# Patient Record
Sex: Male | Born: 1955 | Race: Asian | Hispanic: No | Marital: Married | Smoking: Never smoker
Health system: Southern US, Community
[De-identification: ages and names within clinical notes are randomized; demographics above are authoritative.]

## PROBLEM LIST (undated history)

## (undated) DIAGNOSIS — I219 Acute myocardial infarction, unspecified: Secondary | ICD-10-CM

## (undated) DIAGNOSIS — N400 Enlarged prostate without lower urinary tract symptoms: Secondary | ICD-10-CM

## (undated) DIAGNOSIS — E785 Hyperlipidemia, unspecified: Secondary | ICD-10-CM

## (undated) DIAGNOSIS — Z9889 Other specified postprocedural states: Secondary | ICD-10-CM

## (undated) DIAGNOSIS — S99919A Unspecified injury of unspecified ankle, initial encounter: Secondary | ICD-10-CM

## (undated) DIAGNOSIS — R0602 Shortness of breath: Secondary | ICD-10-CM

## (undated) DIAGNOSIS — M1990 Unspecified osteoarthritis, unspecified site: Secondary | ICD-10-CM

## (undated) DIAGNOSIS — I2510 Atherosclerotic heart disease of native coronary artery without angina pectoris: Secondary | ICD-10-CM

## (undated) DIAGNOSIS — E119 Type 2 diabetes mellitus without complications: Secondary | ICD-10-CM

## (undated) DIAGNOSIS — I499 Cardiac arrhythmia, unspecified: Secondary | ICD-10-CM

## (undated) DIAGNOSIS — H409 Unspecified glaucoma: Secondary | ICD-10-CM

## (undated) DIAGNOSIS — Z95810 Presence of automatic (implantable) cardiac defibrillator: Secondary | ICD-10-CM

## (undated) DIAGNOSIS — I10 Essential (primary) hypertension: Secondary | ICD-10-CM

## (undated) DIAGNOSIS — Z951 Presence of aortocoronary bypass graft: Secondary | ICD-10-CM

## (undated) DIAGNOSIS — R918 Other nonspecific abnormal finding of lung field: Secondary | ICD-10-CM

## (undated) DIAGNOSIS — C801 Malignant (primary) neoplasm, unspecified: Secondary | ICD-10-CM

## (undated) HISTORY — DX: Benign prostatic hyperplasia without lower urinary tract symptoms: N40.0

## (undated) HISTORY — DX: Unspecified glaucoma: H40.9

## (undated) HISTORY — DX: Hyperlipidemia, unspecified: E78.5

## (undated) HISTORY — PX: CORONARY ARTERY BYPASS GRAFT: SHX141

## (undated) HISTORY — DX: Essential (primary) hypertension: I10

## (undated) HISTORY — PX: INSERT / REPLACE / REMOVE PACEMAKER: SUR710

## (undated) HISTORY — DX: Presence of aortocoronary bypass graft: Z95.1

## (undated) HISTORY — DX: Type 2 diabetes mellitus without complications: E11.9

## (undated) HISTORY — PX: EYE SURGERY: SHX253

## (undated) HISTORY — DX: Other nonspecific abnormal finding of lung field: R91.8

## (undated) HISTORY — PX: APPENDECTOMY: SHX54

---

## 1998-03-02 ENCOUNTER — Other Ambulatory Visit: Admission: RE | Admit: 1998-03-02 | Discharge: 1998-03-02 | Payer: Self-pay

## 2001-08-14 ENCOUNTER — Inpatient Hospital Stay (HOSPITAL_COMMUNITY): Admission: AD | Admit: 2001-08-14 | Discharge: 2001-08-23 | Payer: Self-pay | Admitting: Internal Medicine

## 2001-08-15 ENCOUNTER — Encounter: Payer: Self-pay | Admitting: Internal Medicine

## 2001-08-15 ENCOUNTER — Encounter: Payer: Self-pay | Admitting: Thoracic Surgery (Cardiothoracic Vascular Surgery)

## 2001-08-16 ENCOUNTER — Encounter: Payer: Self-pay | Admitting: Internal Medicine

## 2001-08-18 ENCOUNTER — Encounter: Payer: Self-pay | Admitting: Thoracic Surgery (Cardiothoracic Vascular Surgery)

## 2001-08-19 ENCOUNTER — Encounter: Payer: Self-pay | Admitting: Thoracic Surgery (Cardiothoracic Vascular Surgery)

## 2001-08-20 ENCOUNTER — Encounter: Payer: Self-pay | Admitting: Thoracic Surgery (Cardiothoracic Vascular Surgery)

## 2001-09-22 ENCOUNTER — Encounter
Admission: RE | Admit: 2001-09-22 | Discharge: 2001-09-22 | Payer: Self-pay | Admitting: Thoracic Surgery (Cardiothoracic Vascular Surgery)

## 2001-09-22 ENCOUNTER — Encounter: Payer: Self-pay | Admitting: Thoracic Surgery (Cardiothoracic Vascular Surgery)

## 2011-09-13 DIAGNOSIS — H5789 Other specified disorders of eye and adnexa: Secondary | ICD-10-CM | POA: Diagnosis not present

## 2011-10-02 DIAGNOSIS — J209 Acute bronchitis, unspecified: Secondary | ICD-10-CM | POA: Diagnosis not present

## 2011-10-02 DIAGNOSIS — R062 Wheezing: Secondary | ICD-10-CM | POA: Diagnosis not present

## 2011-10-04 DIAGNOSIS — E785 Hyperlipidemia, unspecified: Secondary | ICD-10-CM | POA: Diagnosis not present

## 2011-10-04 DIAGNOSIS — I2510 Atherosclerotic heart disease of native coronary artery without angina pectoris: Secondary | ICD-10-CM | POA: Diagnosis not present

## 2011-10-04 DIAGNOSIS — I10 Essential (primary) hypertension: Secondary | ICD-10-CM | POA: Diagnosis not present

## 2011-10-04 DIAGNOSIS — E119 Type 2 diabetes mellitus without complications: Secondary | ICD-10-CM | POA: Diagnosis not present

## 2011-11-13 DIAGNOSIS — E782 Mixed hyperlipidemia: Secondary | ICD-10-CM | POA: Diagnosis not present

## 2011-11-13 DIAGNOSIS — I10 Essential (primary) hypertension: Secondary | ICD-10-CM | POA: Diagnosis not present

## 2012-02-05 DIAGNOSIS — M25559 Pain in unspecified hip: Secondary | ICD-10-CM | POA: Diagnosis not present

## 2012-02-08 DIAGNOSIS — M25559 Pain in unspecified hip: Secondary | ICD-10-CM | POA: Diagnosis not present

## 2012-04-11 DIAGNOSIS — E782 Mixed hyperlipidemia: Secondary | ICD-10-CM | POA: Diagnosis not present

## 2012-04-11 DIAGNOSIS — I10 Essential (primary) hypertension: Secondary | ICD-10-CM | POA: Diagnosis not present

## 2012-07-14 DIAGNOSIS — R109 Unspecified abdominal pain: Secondary | ICD-10-CM | POA: Diagnosis not present

## 2012-07-14 DIAGNOSIS — M545 Low back pain, unspecified: Secondary | ICD-10-CM | POA: Diagnosis not present

## 2012-07-18 DIAGNOSIS — N401 Enlarged prostate with lower urinary tract symptoms: Secondary | ICD-10-CM | POA: Diagnosis not present

## 2012-07-18 DIAGNOSIS — D485 Neoplasm of uncertain behavior of skin: Secondary | ICD-10-CM | POA: Diagnosis not present

## 2012-07-18 DIAGNOSIS — N138 Other obstructive and reflux uropathy: Secondary | ICD-10-CM | POA: Diagnosis not present

## 2012-08-25 DIAGNOSIS — R062 Wheezing: Secondary | ICD-10-CM | POA: Diagnosis not present

## 2012-08-26 DIAGNOSIS — L821 Other seborrheic keratosis: Secondary | ICD-10-CM | POA: Diagnosis not present

## 2012-08-26 DIAGNOSIS — L578 Other skin changes due to chronic exposure to nonionizing radiation: Secondary | ICD-10-CM | POA: Diagnosis not present

## 2012-08-26 DIAGNOSIS — R062 Wheezing: Secondary | ICD-10-CM | POA: Diagnosis not present

## 2012-10-02 DIAGNOSIS — E669 Obesity, unspecified: Secondary | ICD-10-CM | POA: Diagnosis not present

## 2012-10-02 DIAGNOSIS — I10 Essential (primary) hypertension: Secondary | ICD-10-CM | POA: Diagnosis not present

## 2012-10-02 DIAGNOSIS — I2510 Atherosclerotic heart disease of native coronary artery without angina pectoris: Secondary | ICD-10-CM | POA: Diagnosis not present

## 2012-10-02 DIAGNOSIS — Z833 Family history of diabetes mellitus: Secondary | ICD-10-CM | POA: Diagnosis not present

## 2012-10-02 DIAGNOSIS — Z8249 Family history of ischemic heart disease and other diseases of the circulatory system: Secondary | ICD-10-CM | POA: Diagnosis not present

## 2012-10-02 DIAGNOSIS — E119 Type 2 diabetes mellitus without complications: Secondary | ICD-10-CM | POA: Diagnosis not present

## 2012-10-02 DIAGNOSIS — E785 Hyperlipidemia, unspecified: Secondary | ICD-10-CM | POA: Diagnosis not present

## 2012-10-06 DIAGNOSIS — R972 Elevated prostate specific antigen [PSA]: Secondary | ICD-10-CM | POA: Diagnosis not present

## 2012-10-06 DIAGNOSIS — N401 Enlarged prostate with lower urinary tract symptoms: Secondary | ICD-10-CM | POA: Diagnosis not present

## 2012-10-16 DIAGNOSIS — N529 Male erectile dysfunction, unspecified: Secondary | ICD-10-CM | POA: Diagnosis not present

## 2012-10-16 DIAGNOSIS — R972 Elevated prostate specific antigen [PSA]: Secondary | ICD-10-CM | POA: Diagnosis not present

## 2012-10-16 DIAGNOSIS — N401 Enlarged prostate with lower urinary tract symptoms: Secondary | ICD-10-CM | POA: Diagnosis not present

## 2013-01-26 DIAGNOSIS — E782 Mixed hyperlipidemia: Secondary | ICD-10-CM | POA: Diagnosis not present

## 2013-01-26 DIAGNOSIS — I10 Essential (primary) hypertension: Secondary | ICD-10-CM | POA: Diagnosis not present

## 2013-02-23 DIAGNOSIS — Z951 Presence of aortocoronary bypass graft: Secondary | ICD-10-CM | POA: Diagnosis not present

## 2013-02-23 DIAGNOSIS — J209 Acute bronchitis, unspecified: Secondary | ICD-10-CM | POA: Diagnosis not present

## 2013-02-23 DIAGNOSIS — R062 Wheezing: Secondary | ICD-10-CM | POA: Diagnosis not present

## 2013-02-23 DIAGNOSIS — J029 Acute pharyngitis, unspecified: Secondary | ICD-10-CM | POA: Diagnosis not present

## 2013-02-23 DIAGNOSIS — R918 Other nonspecific abnormal finding of lung field: Secondary | ICD-10-CM | POA: Diagnosis not present

## 2013-02-26 DIAGNOSIS — J9801 Acute bronchospasm: Secondary | ICD-10-CM | POA: Diagnosis not present

## 2013-02-26 DIAGNOSIS — R918 Other nonspecific abnormal finding of lung field: Secondary | ICD-10-CM | POA: Diagnosis not present

## 2013-03-02 DIAGNOSIS — R918 Other nonspecific abnormal finding of lung field: Secondary | ICD-10-CM | POA: Diagnosis not present

## 2013-03-04 DIAGNOSIS — R222 Localized swelling, mass and lump, trunk: Secondary | ICD-10-CM | POA: Diagnosis not present

## 2013-03-04 LAB — PULMONARY FUNCTION TEST

## 2013-03-18 DIAGNOSIS — K8020 Calculus of gallbladder without cholecystitis without obstruction: Secondary | ICD-10-CM | POA: Diagnosis not present

## 2013-03-18 DIAGNOSIS — R911 Solitary pulmonary nodule: Secondary | ICD-10-CM | POA: Diagnosis not present

## 2013-03-18 DIAGNOSIS — K5730 Diverticulosis of large intestine without perforation or abscess without bleeding: Secondary | ICD-10-CM | POA: Diagnosis not present

## 2013-03-18 DIAGNOSIS — I2510 Atherosclerotic heart disease of native coronary artery without angina pectoris: Secondary | ICD-10-CM | POA: Diagnosis not present

## 2013-03-18 DIAGNOSIS — R222 Localized swelling, mass and lump, trunk: Secondary | ICD-10-CM | POA: Diagnosis not present

## 2013-03-19 DIAGNOSIS — Z006 Encounter for examination for normal comparison and control in clinical research program: Secondary | ICD-10-CM | POA: Diagnosis not present

## 2013-03-19 DIAGNOSIS — R222 Localized swelling, mass and lump, trunk: Secondary | ICD-10-CM | POA: Diagnosis not present

## 2013-03-19 DIAGNOSIS — E785 Hyperlipidemia, unspecified: Secondary | ICD-10-CM | POA: Diagnosis not present

## 2013-03-19 DIAGNOSIS — I10 Essential (primary) hypertension: Secondary | ICD-10-CM | POA: Diagnosis not present

## 2013-05-01 DIAGNOSIS — R222 Localized swelling, mass and lump, trunk: Secondary | ICD-10-CM | POA: Diagnosis not present

## 2013-05-01 DIAGNOSIS — R05 Cough: Secondary | ICD-10-CM | POA: Diagnosis not present

## 2013-05-04 ENCOUNTER — Encounter: Payer: Self-pay | Admitting: *Deleted

## 2013-05-04 DIAGNOSIS — E119 Type 2 diabetes mellitus without complications: Secondary | ICD-10-CM | POA: Insufficient documentation

## 2013-05-04 DIAGNOSIS — C3492 Malignant neoplasm of unspecified part of left bronchus or lung: Secondary | ICD-10-CM | POA: Insufficient documentation

## 2013-05-04 DIAGNOSIS — N400 Enlarged prostate without lower urinary tract symptoms: Secondary | ICD-10-CM | POA: Insufficient documentation

## 2013-05-04 DIAGNOSIS — I10 Essential (primary) hypertension: Secondary | ICD-10-CM | POA: Insufficient documentation

## 2013-05-04 DIAGNOSIS — E785 Hyperlipidemia, unspecified: Secondary | ICD-10-CM | POA: Insufficient documentation

## 2013-05-05 ENCOUNTER — Other Ambulatory Visit: Payer: Self-pay | Admitting: *Deleted

## 2013-05-05 ENCOUNTER — Encounter: Payer: Self-pay | Admitting: Cardiothoracic Surgery

## 2013-05-05 ENCOUNTER — Institutional Professional Consult (permissible substitution) (INDEPENDENT_AMBULATORY_CARE_PROVIDER_SITE_OTHER): Payer: Medicare Other | Admitting: Cardiothoracic Surgery

## 2013-05-05 VITALS — BP 170/81 | HR 67 | Resp 20 | Ht 68.0 in | Wt 204.0 lb

## 2013-05-05 DIAGNOSIS — Z951 Presence of aortocoronary bypass graft: Secondary | ICD-10-CM

## 2013-05-05 DIAGNOSIS — R222 Localized swelling, mass and lump, trunk: Secondary | ICD-10-CM | POA: Diagnosis not present

## 2013-05-05 DIAGNOSIS — R918 Other nonspecific abnormal finding of lung field: Secondary | ICD-10-CM

## 2013-05-05 HISTORY — DX: Presence of aortocoronary bypass graft: Z95.1

## 2013-05-05 NOTE — Progress Notes (Signed)
[address]       [place] [address]             [phone]                    Alvaro Aungst [facility] Medical Record #[id number] Date of Birth: [birth year]  Referring: Marcellus Scott, MD Primary Care: Abner Greenspan, MD  Chief Complaint:    Chief Complaint  Patient presents with  . Lung Mass    Surgical eval on lung mass, PET Scan 03/18/13/ Chest CT 03/02/13    History of Present Illness:    Patient is a 57 year old nonsmoker who presents with a left lung mass found on chest x-ray and followed up with CT and PET. The patient was treated for a "pneumonic" process in December 2013 with antibiotics. In June he noted some increase in wheezing and cough and chest x-ray showed persistence of a left lung mass. Subsequent CT of the chest and PET scan was performed. Patient is referred for further evaluation of left lung mass. He denies any hemoptysis. He does have some shortness of breath with exertion but is without symptoms with usual activities. He didn't have coronary artery bypass grafting in 2002 and has had no cardiac events since that time. He has long-standing diabetes.  The patient notes that he is a lifelong nonsmoker, he denies any history of tuberculosis, he denies any work related to asbestos.       Current Activity/ Functional Status:  Patient is independent with mobility/ambulation, transfers, ADL's, IADL's.  Zubrod Score: At the time of surgery this patient's most appropriate activity status/level should be described as: []  Normal activity, no symptoms [x]  Symptoms, fully ambulatory []  Symptoms, in bed less than or equal to 50% of the time []  Symptoms, in bed greater than 50% of the time but less than 100% []  Bedridden []  Moribund   Past Medical History  Diagnosis Date  . Hyperlipidemia   . Hypertension   . BPH (benign prostatic hypertrophy)   . Mass of lung     RIGHT UPPER LOBE  . Diabetes mellitus without complication     TYPE 2  .  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 Coronary artery bypass graft      CABG X 5 08/14/2001    Family History: Patient's father is still alive at age 61 with mild hypertension, his mother died at age 87 of diabetes related complications. He has 3 children 2517 and 15 were all healthy.  History   Social History  . Marital Status: Married    Spouse Name:     Number of Children: 3  . Years of Education: N/A   Occupational History  .  currently on disability for diabetes and cardiac disease    Social History Main Topics  . Smoking status: Never Smoker   . Smokeless tobacco: Not on file  . Alcohol Use: No  . Drug Use: no  .          No Known Allergies  Current Outpatient Prescriptions  Medication Sig Dispense Refill  . albuterol (PROVENTIL HFA;VENTOLIN HFA) 108 (90 BASE) MCG/ACT inhaler Inhale 2 puffs into the lungs every 6 (six) hours as needed for wheezing.      Marland Kitchen  aspirin EC 325 MG tablet Take 325 mg by mouth daily.      Marland Kitchen atorvastatin (LIPITOR) 40 MG tablet Take 40 mg by mouth daily.      . hydrochlorothiazide (HYDRODIURIL) 25 MG tablet Take 25 mg by mouth daily.      . insulin NPH-regular (NOVOLIN 70/30) (70-30) 100 UNIT/ML injection Inject 55 Units into the skin daily with breakfast. 44U NOON AND 55U QHS      . metFORMIN (GLUMETZA) 500 MG (MOD) 24 hr tablet Take 1,000 mg by mouth 2 (two) times daily with a meal.      . metoprolol (LOPRESSOR) 50 MG tablet Take 50 mg by mouth 2 (two) times daily.      . ramipril (ALTACE) 10 MG capsule Take 10 mg by mouth 2 (two) times daily.      . tamsulosin (FLOMAX) 0.4 MG CAPS capsule Take by mouth.          Review of Systems:     Cardiac Review of Systems: Y or N  Chest Pain [  n  ]  Resting SOB [n   ] Exertional SOB  [ y ]  Orthopnea [ n ]   Pedal Edema [  Mild rt > left  ]    Palpitations [n  ] Syncope  [ n ]   Presyncope [ n  ]  General Review of Systems: [Y] = yes [  ]=no Constitional: recent weight change [ n ]; anorexia [ n ]; fatigue [n  ]; nausea [ n ]; night sweats [  n]; fever [n  ]; or chills [ n ];                                                                                                                                          Dental: poor dentition[n  ]; Last Dentist visit:   Eye : blurred vision [ n ]; diplopia [   ]; vision changes [  ];  Amaurosis fugax[ n ]; Resp: cough [ y ];  wheezing[rare ];  hemoptysis[ n ]; shortness of breath[ y ]; paroxysmal nocturnal dyspnea[ n ]; dyspnea on exertion[ y ]; or orthopnea[ n ];  GI:  gallstones[ n ], vomiting[n  ];  dysphagia[ n ]; melena[ n;  hematochezia [  ]; heartburn[  ];   Hx of  Colonoscopy[ no ]; GU: kidney stones [ n ]; hematuria[n  ];   dysuria Cove.Etienne  ];  nocturia[  ];  history of     obstruction [ n ]; urinary frequency Cove.Etienne  ]             Skin: rash, swelling[  ];, hair loss[  ];  peripheral edema[  ];  or itching[  ]; Musculosketetal: myalgias[  ];  joint swelling[  ];  joint erythema[  ];  joint pain[  ];  back pain[  ];  Heme/Lymph: bruising[  ];  bleeding[  ];  anemia[  ];  Neuro: TIA[  ];  headaches[  ];  stroke[  ];  vertigo[  ];  seizures[  ];   paresthesias[  ];  difficulty walking[  ];  Psych:depression[  ]; anxiety[  ];  Endocrine: diabetes[ y ];  thyroid dysfunction[ n ];  Immunizations: Flu Cove.Etienne  ]; Pneumococcal[ y ];  Other:  Physical Exam: BP 170/81  Pulse 67  Resp 20  Ht 5\' 8"  (1.727 m)  Wt 204 lb (92.534 kg)  BMI 31.03 kg/m2  SpO2 95%  General appearance: alert, cooperative, appears older than stated age and no distress Neurologic: intact Heart: regular rate and rhythm, S1, S2 normal, no murmur, click, rub or gallop Lungs: clear to auscultation bilaterally and normal percussion bilaterally Abdomen: soft, non-tender; bowel sounds normal; no masses,  no  organomegaly Extremities: extremities normal, atraumatic, no cyanosis or edema and Homans sign is negative, no sign of DVT Patient has no cervical or supraclavicular adenopathy, as probable DP and PT pulses, or scars on the medial aspect of his right ankle and knee from previous trauma and pinning  Diagnostic Studies & Laboratory data:     Recent Radiology Findings:  Clinical Data: Abnormal chest radiograph.  03/02/2013 CT CHEST WITHOUT CONTRAST  Technique: Multidetector CT imaging of the chest was performed following the standard protocol without IV contrast.  Comparison: Chest radiograph 02/23/2013.  Findings: No pathologically enlarged mediastinal or axillary lymph nodes. Hilar regions are difficult to definitively evaluate without IV contrast. Heart size normal. No pericardial effusion. Incidental note is made of mild bilateral gynecomastia.  Mass-like airspace consolidation in the lingula measures 2.6 x 3.3 cm and has a tag to the adjacent pleura. Finding corresponds to the questioned abnormality on recent chest radiograph. Minimal surrounding inflammatory change. Additional scattered pulmonary nodules measure up to 6 mm in the left lower lobe. Probable calcified granuloma in the right lower lobe. No pleural fluid. Airway is unremarkable.  Incidental imaging of the upper abdomen shows no acute findings. No worrisome lytic or sclerotic lesions. Degenerative changes are seen in the spine with prominent osteophytes.  IMPRESSION:  1. Mass-like consolidation in the lingula has an appearance indicative of primary bronchogenic carcinoma. Pneumonia cannot be excluded. Follow-up could be performed in 4-6 weeks, if there are infectious symptoms and after antibiotic therapy, as clinically indicated. A more aggressive approach would include consultation with pulmonary medicine or thoracic surgery. These results will be called to the ordering clinician or representative by  the Radiologist Assistant, and communication documented in the PACS Dashboard. 2. Scattered pulmonary nodules measure up to 6 mm. Continued attention on follow-up exams is warranted.   Original Report Authenticated By: Leanna Battles, M.D.      03/18/2013 NUCLEAR MEDICINE PET SKULL BASE TO THIGH  Fasting Blood Glucose: 210  Technique: 11.58 mCi F-18 FDG was injected intravenously. CT data was obtained and used for attenuation correction and anatomic localization only. (This was not acquired as a diagnostic CT examination.) Additional exam technical data entered on technologist worksheet.  Comparison: Chest CT 03/02/2013.  Findings:  Neck: No hypermetabolic lymph nodes in the neck.  Chest: Again noted is a 3.3 x 2.6 cm mass in the left upper lobe which has some internal air bronchograms. The periphery of this lesion has slightly spiculated margins with mild retraction of the adjacent pleural laterally. This lesion demonstrates only low- level metabolic activity (SUVmax = 2.1). No hypermetabolic mediastinal or hilar nodes. There is  atherosclerosis of the thoracic aorta, the great vessels of the mediastinum and the coronary arteries, including calcified atherosclerotic plaque in the left main, left anterior descending, left circumflex and right coronary arteries. Status post median sternotomy for CABG. No pleural effusions.  Abdomen/Pelvis: No abnormal hypermetabolic activity within the liver, pancreas, adrenal glands, or spleen. No hypermetabolic lymph nodes in the abdomen or pelvis. Small calcified gallstone in the neck of the gallbladder. No current findings to suggest acute cholecystitis at this time. Extensive atherosclerosis throughout the abdominal and pelvic vasculature, without definite aneurysm. There are a few scattered colonic diverticula, without surrounding inflammatory changes to suggest acute diverticulitis at this time. No significant volume of ascites.  No pneumoperitoneum. No pathologic distension of small bowel. Prostate gland and urinary bladder are unremarkable in appearance.  Skeleton: No focal hypermetabolic activity to suggest skeletal metastasis.  IMPRESSION: 1. 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 Low level metabolic activity within this lesion would be typical for such an entity, and given the persistence on chest radiographs compared to prior study from 08/26/2012, this is highly suspicious. Surgical consultation is strongly recommended. No evidence of nodal metastases in the hila or mediastinal stations. No evidence of distal metastatic disease at this time. Accordingly, this appears to represent T2a, N0, M0 disease (i.e., stage IB). 2. Atherosclerosis, including left main and three-vessel coronary artery disease. Status post median sternotomy for CABG. 3. Cholelithiasis without evidence to suggest acute cholecystitis at this time. 4. Mild colonic diverticulosis without evidence to suggest acute diverticulitis at this time.  Original Report Authenticated By: Trudie Reed, M.D.  08/26/2012 Clinical Data: Wheezing. Cough and congestion.  CHEST - 2 VIEW  Comparison: 08/24/2004  Findings: Mild cardiac enlargement. Previous median sternotomy and CABG procedure. Opacity within the left upper lobe is identified and appears new from previous exam. This may represent early or resolving pneumonia. No effusions or edema identified.  IMPRESSION:  1. Left upper lobe opacity which may represent pneumonia. Radiographic followup is recommended to ensure resolution.   Original Report Authenticated By: Signa Kell, M.D.  PFT's screening 03/04/2013   FEV1 2.27   72% no diffusion capacity   Recent Lab Findings: No results found for this basename: WBC, HGB, HCT, PLT, GLUCOSE, CHOL, TRIG, HDL, LDLDIRECT, LDLCALC, ALT, AST, NA, K, CL, CREATININE, BUN,  CO2, TSH, INR, GLUF, HGBA1C      Assessment / Plan:    #1 suspicious groundglass left lung mass possible slow-growing malignancy #2 history of previous coronary artery bypass grafting #3 diabetes mellitus  I discussed with the patient the possible diagnoses of the left lung mass and treatment options. I recommended to the patient that we proceed with needle biopsy first and if positive for malignancy then proceed with left upper lobectomy. Left upper lobectomy may be complicated because of his previous bypass surgery and use of left internal mammary artery. I'll plan to see him back in 7-14 days depending on making arrangements for an the needle biopsy.  We will obtain a full set of pulmonary function studies and including diffusion capacity  The patient has a cardiology appointment next week, we will ask his cardiologist to consider cardiology clearance for future left upper lobectomy, currently the patient seems asymptomatic from a cardiology standpoint.   Delight Ovens MD      [address] [place] [place] [address] Office [phone]   Beeper [phone]  05/05/2013 2:59 PM

## 2013-05-05 NOTE — Patient Instructions (Signed)
Pulmonary Nodule A pulmonary (lung) nodule is small, round growth in the lung. The size of a pulmonary nodule can be as small as a pencil eraser (1/5 inch or 4 millmeters) to a little bigger than your biggest toenail (1 inch or 25 millimeters). A pulmonary nodule is usually an unplanned finding. It may be found on a chest X-ray or a computed tomography (CT) scan when you have imaging tests of your lungs done. When a pulmonary nodule is found, tests will be done to determine if the nodule is benign (not cancerous) or malignant (cancerous). Follow-up treatment or testing is based on the size of the pulmonary nodule and your risk of getting lung cancer.  CAUSES Causes of pulmonary nodules can vary.  Benign pulmonary nodules  can be caused from different things. Some of these things include:  Infection. This can be a common cause of a benign pulmonary nodule. The infection may be active (a current infection) or an old infection that is no longer active. Three types of infections can cause a pulmonary nodule. These are:  Bacterial Infection.  Fungal infection.  Viral Infections.  Hematoma. This is a bruise in the lung. A hematoma can happen from an injury to your chest.  Some common diseases can lead to benign pulmonary nodules. For example, rheumatoid arthritis can be a cause of a pulmonary nodule.  Other unusual things can cause a benign pulmonary nodule. These can include:  Having had tuberculosis.  Rare diseases, such as a lung cyst. Malignant pulmonary nodules.  These are cancerous growths. The cancer may have:  Started in the lung. Some lung cancers first detected as a pulmonary nodule.  Spread to the lung from cancer somewhere else in the body. This is called metastatic cancer.  Certain risk factors make a cancerous pulmonary nodule more likely. They include:  Age. As people get older, a pulmonary nodule is more likely to be cancerous.  Cancer history. If one of your immediate  family members has had cancer, you have a higher risk of developing cancer.  Smoking. This includes people who currently smoke and those who have quit. DIAGNOSIS To diagnose whether a pulmonary nodule is benign or malignant, a variety of tests will be done. This includes things such as:  Health history. Questions regarding your current health, past health, and family health will be asked.  Blood tests. Results of blood work can show:  Tumor markers for cancer.  Any type of infection.  A skin test called a tuberculin (TB) test may be done. This test can tell if you have been exposed to the germ that causes tuberculosis.  Imaging tests. These take pictures of your lungs. Types of imaging tests include:  Chest X-ray. This can help in several ways. An X-ray gives a close-up look at the pulmonary nodule. A new X-ray can be compared with any X-rays you have had in the past.   Computed tomography  (CT) scan. This test shows smaller pulmonary nodules more clearly than an X-ray.  Positron emission tomography  (PET) scan. This is a test that uses a radioactive substance to identify a pulmonary nodule. A safe amount of radioactive substance is injected into the blood stream. Then, the scan takes a picture of the pulmonary nodule. A malignant pulmonary nodule will absorb the substance faster than a benign pulmonary nodule. The radioactive substance is eliminated from your body in your urine.  Biopsy.  This removes a tiny piece of the pulmonary nodule so it can be checked  under a microscope. Medicine will be given to help keep you relaxed and pain free when a biopsy is done. Types of biopsies include:  Bronchoscopy . This is a surgical procedure. It can be used for pulmonary nodules that are close to the airways in the lung. It uses a scope (a thin tube) with a tiny camera and light on the end. The scope is put in the windpipe. Your caregiver can then see inside the lung. A tiny tool put through the  scope is used to take a small sample of the pulmonary nodule tissue.  Transthoracic needle aspiration . This method is used if the pulmonary nodule is far away from the air passages in the lung. A long, thin needle is put through the chest into the lung nodule. A CT scan is done at the same time which can make it easier to locate the pulmonary nodule.  Surgical lung biopsy . This is a surgical procedure in which the pulmonary nodule is removed. This is usually recommended when the pulmonary nodule is most likely malignant or a biopsy cannot be obtained by either bronchoscopy or transthoracic needle aspiration. PULMONARY NODULE FOLLOW-UP RECOMMENDATIONS The frequency of pulmonary nodule follow-up is based on your risk factors and size of the pulmonary nodule. If your caregiver suspects the pulmonary nodule is cancerous or the pulmonary nodule changes during any of the follow-up CT scans, additional testing or biopsies will be done.   If you have no or low risk of getting lung cancer (non-smoker, no personal cancer history), recommended follow-up is based on the following pulmonary nodule size:  A pulmonary nodule that is < 4 mm does not require any follow-up.  A pulmonary nodule that is 4 to 6 mm should be re-imaged by CT scan in 12 months.  A pulmonary nodule that is 6 to 8 mm should be re-imaged by CT scan at 6 to 12 months and then again at 18 to 24 months if no change in size.  A pulmonary nodule > 8 mm in size should be followed closely and re-imaged by CT scan at 3, 9, and 24 months.   If you are at risk of getting lung cancer (current or former smoker, family history of cancer), recommended follow-up is based on the following pulmonary nodule size:  A pulmonary nodule that is < 4 mm in size should be re-imaged by CT scan in 12 months.  A pulmonary nodule that is 4 to 6 mm in size should be re-imaged by CT scan at 6 to 12 months and again at 18 to 24 months.  A pulmonary nodule that is  6 to 8 mm in size should be re-imaged by CT scan at 3, 9, and 24 months.  A pulmonary nodule > 8 mm in size should be followed closely and re-imaged by CT scan at 3, 9, and 24 months. SEEK MEDICAL CARE IF: While waiting for test results to determine what type of pulmonary nodule you have, be sure to contact your caregiver if you:  Have trouble breathing when you are active.  Feel sick or unusually tired.  Do not feel like eating.  Lose weight without trying to.  Develop chills or night sweats.  Mild or moderate fevers generally have no long-term effects and often do not require treatment. There are a few exceptions (see below). SEEK IMMEDIATE MEDICAL CARE IF:  You cannot catch your breath or you begin wheezing.  You cannot stop coughing.  You cough up blood.  You feel like you are going to pass out or become dizzy.  You have sudden chest pain.  You have a fever or persistent symptoms for more than 72 hours.  You have a fever and your symptoms suddenly get worse. MAKE SURE YOU   Understand these instructions.  Will watch your condition.  Will get help right away if you are not doing well or get worse. Document Released: 06/10/2009 Document Revised: 11/05/2011 Document Reviewed: 06/10/2009 [facility] Patient Information 2014 [place], [place].  Needle Biopsy of Lung Care After A needle biopsy is a procedure to get a sample of cells from your body for testing. A needle biopsy may be used to take tissue or fluid samples from muscles, bones and organs, such as the liver or lungs. The sample from your needle biopsy may help your doctor determine what is causing:  A mass or lump. A needle biopsy may reveal whether a mass or lump is a cyst, an infection, a benign tumor or cancer.  Infection. Tests from a needle biopsy can help doctors determine what germs are causing an infection so that the best medicines can be used for treatment.  Inflammation. Looking closely at a needle  biopsy sample may reveal what is causing inflammation and what types of cells are involved. Your caregiver has now completed a needle biopsy of the lung to help diagnose a medical condition or to rule out a disease or condition. A needle biopsy may also be used to assess the progress of a treatment. AFTER THE PROCEDURE Once your caregiver has collected enough cells or tissue for analysis, your needle biopsy procedure is complete. Your biopsy sample is sent to a laboratory to be tested. The results may be available in a day or two. More technical tests may require more time. Ask your caregiver how long you can expect to wait.  Your health care team may apply a bandage over the areas where the needle was inserted. You may be asked to apply pressure to the bandage for several minutes to ensure there is minimal bleeding. In most cases, you can leave when your needle biopsy procedure is completed. Do not drive yourself home. Someone else should take you home. Whether you can leave right away or whether you will need to stay for observation depends on how you feel and the exam by your caregiver after the biopsy. In some cases your health care team may want to observe you for a few hours to ensure you do not have complications from your biopsy. If you received an IV sedative or general anesthetic, you will be taken to a comfortable place to relax while the medication wears off. Expect to take it easy for the rest of the day. Protect the area where you received the needle biopsy by keeping the bandage in place for as long as instructed. You may feel some mild pain or discomfort in the area, but this should stop in a day or two. Only take over-the-counter or prescription medicines for pain, discomfort, or fever as directed by your caregiver. SEEK MEDICAL CARE IF:   You have pain at the biopsy site that worsens or is not helped by medicines.  You have swelling at the needle biopsy site.  You have drainage from  the biopsy site.  You have new or unusual pain in your back or at the top of one or both shoulders. SEEK IMMEDIATE MEDICAL CARE IF:   You have a fever.  You develop lightheadedness or fainting.  You have chest pain or shortness of breath.  You have bleeding that does not stop with pressure or a bandage.  You have weakness or numbness in your legs. Document Released: 06/10/2007 Document Revised: 11/05/2011 Document Reviewed: 06/10/2007 [facility] Patient Information 2014 [place], [place].

## 2013-05-06 ENCOUNTER — Other Ambulatory Visit: Payer: Self-pay

## 2013-05-06 DIAGNOSIS — D381 Neoplasm of uncertain behavior of trachea, bronchus and lung: Secondary | ICD-10-CM

## 2013-05-07 ENCOUNTER — Encounter (HOSPITAL_COMMUNITY): Payer: Medicare Other

## 2013-05-08 ENCOUNTER — Other Ambulatory Visit: Payer: Self-pay | Admitting: Radiology

## 2013-05-11 ENCOUNTER — Ambulatory Visit (HOSPITAL_COMMUNITY)
Admission: RE | Admit: 2013-05-11 | Discharge: 2013-05-11 | Disposition: A | Discharge status: Home / Self Care | Payer: Medicare Other | Source: Ambulatory Visit | Attending: Cardiothoracic Surgery | Admitting: Cardiothoracic Surgery

## 2013-05-11 DIAGNOSIS — R222 Localized swelling, mass and lump, trunk: Secondary | ICD-10-CM | POA: Insufficient documentation

## 2013-05-11 DIAGNOSIS — R918 Other nonspecific abnormal finding of lung field: Secondary | ICD-10-CM

## 2013-05-11 LAB — PULMONARY FUNCTION TEST

## 2013-05-11 MED ORDER — ALBUTEROL SULFATE (5 MG/ML) 0.5% IN NEBU
2.5000 mg | INHALATION_SOLUTION | Freq: Once | RESPIRATORY_TRACT | Status: AC
  Administered 2013-05-11: 2.5 mg via RESPIRATORY_TRACT

## 2013-05-12 ENCOUNTER — Encounter (HOSPITAL_COMMUNITY): Payer: Self-pay | Admitting: Pharmacy Technician

## 2013-05-13 DIAGNOSIS — I2510 Atherosclerotic heart disease of native coronary artery without angina pectoris: Secondary | ICD-10-CM | POA: Diagnosis not present

## 2013-05-13 DIAGNOSIS — E785 Hyperlipidemia, unspecified: Secondary | ICD-10-CM | POA: Diagnosis not present

## 2013-05-13 DIAGNOSIS — E669 Obesity, unspecified: Secondary | ICD-10-CM | POA: Diagnosis not present

## 2013-05-13 DIAGNOSIS — E119 Type 2 diabetes mellitus without complications: Secondary | ICD-10-CM | POA: Diagnosis not present

## 2013-05-13 DIAGNOSIS — I10 Essential (primary) hypertension: Secondary | ICD-10-CM | POA: Diagnosis not present

## 2013-05-14 ENCOUNTER — Encounter (HOSPITAL_COMMUNITY): Payer: Self-pay

## 2013-05-14 ENCOUNTER — Ambulatory Visit (HOSPITAL_COMMUNITY)
Admission: RE | Admit: 2013-05-14 | Discharge: 2013-05-14 | Disposition: A | Discharge status: Home / Self Care | Payer: Medicare Other | Source: Ambulatory Visit | Attending: Cardiothoracic Surgery | Admitting: Cardiothoracic Surgery

## 2013-05-14 ENCOUNTER — Other Ambulatory Visit (HOSPITAL_COMMUNITY): Payer: Self-pay | Admitting: Interventional Radiology

## 2013-05-14 DIAGNOSIS — I10 Essential (primary) hypertension: Secondary | ICD-10-CM | POA: Insufficient documentation

## 2013-05-14 DIAGNOSIS — D381 Neoplasm of uncertain behavior of trachea, bronchus and lung: Secondary | ICD-10-CM

## 2013-05-14 DIAGNOSIS — E785 Hyperlipidemia, unspecified: Secondary | ICD-10-CM | POA: Diagnosis not present

## 2013-05-14 DIAGNOSIS — Y849 Medical procedure, unspecified as the cause of abnormal reaction of the patient, or of later complication, without mention of misadventure at the time of the procedure: Secondary | ICD-10-CM | POA: Insufficient documentation

## 2013-05-14 DIAGNOSIS — J95811 Postprocedural pneumothorax: Secondary | ICD-10-CM | POA: Diagnosis not present

## 2013-05-14 DIAGNOSIS — C3410 Malignant neoplasm of upper lobe, unspecified bronchus or lung: Secondary | ICD-10-CM | POA: Diagnosis not present

## 2013-05-14 DIAGNOSIS — C3490 Malignant neoplasm of unspecified part of unspecified bronchus or lung: Secondary | ICD-10-CM | POA: Insufficient documentation

## 2013-05-14 DIAGNOSIS — E119 Type 2 diabetes mellitus without complications: Secondary | ICD-10-CM | POA: Diagnosis not present

## 2013-05-14 DIAGNOSIS — R222 Localized swelling, mass and lump, trunk: Secondary | ICD-10-CM | POA: Insufficient documentation

## 2013-05-14 DIAGNOSIS — R911 Solitary pulmonary nodule: Secondary | ICD-10-CM | POA: Diagnosis not present

## 2013-05-14 DIAGNOSIS — Z9889 Other specified postprocedural states: Secondary | ICD-10-CM

## 2013-05-14 DIAGNOSIS — Y9210 Unspecified residential institution as the place of occurrence of the external cause: Secondary | ICD-10-CM | POA: Insufficient documentation

## 2013-05-14 DIAGNOSIS — R918 Other nonspecific abnormal finding of lung field: Secondary | ICD-10-CM | POA: Diagnosis not present

## 2013-05-14 LAB — APTT: aPTT: 27 seconds (ref 24–37)

## 2013-05-14 LAB — CBC
MCH: 27.2 pg (ref 26.0–34.0)
MCHC: 33.5 g/dL (ref 30.0–36.0)
Platelets: 218 10*3/uL (ref 150–400)
RDW: 14.8 % (ref 11.5–15.5)

## 2013-05-14 LAB — GLUCOSE, CAPILLARY
Glucose-Capillary: 120 mg/dL — ABNORMAL HIGH (ref 70–99)
Glucose-Capillary: 108 mg/dL — ABNORMAL HIGH (ref 70–99)

## 2013-05-14 LAB — PROTIME-INR
INR: 1.02 (ref 0.00–1.49)
Prothrombin Time: 13.2 seconds (ref 11.6–15.2)

## 2013-05-14 MED ORDER — MIDAZOLAM HCL 2 MG/2ML IJ SOLN
INTRAMUSCULAR | Status: AC
  Filled 2013-05-14: qty 6

## 2013-05-14 MED ORDER — FENTANYL CITRATE 0.05 MG/ML IJ SOLN
INTRAMUSCULAR | Status: AC
  Filled 2013-05-14: qty 4

## 2013-05-14 MED ORDER — FENTANYL CITRATE 0.05 MG/ML IJ SOLN
INTRAMUSCULAR | Status: AC | PRN
  Administered 2013-05-14 (×4): 50 ug via INTRAVENOUS

## 2013-05-14 MED ORDER — SODIUM CHLORIDE 0.9 % IV SOLN: Freq: Once | INTRAVENOUS | Status: DC

## 2013-05-14 MED ORDER — MIDAZOLAM HCL 2 MG/2ML IJ SOLN
INTRAMUSCULAR | Status: AC | PRN
  Administered 2013-05-14 (×2): 1 mg via INTRAVENOUS
  Administered 2013-05-14 (×2): 2 mg via INTRAVENOUS

## 2013-05-14 MED ORDER — HYDROCODONE-ACETAMINOPHEN 5-325 MG PO TABS: 1.0000 | ORAL_TABLET | ORAL | Status: DC | PRN

## 2013-05-14 NOTE — Progress Notes (Signed)
PCXR performed at bedside

## 2013-05-14 NOTE — ED Notes (Signed)
Transferred to nursing station to wait on short stay bed assignment.  Report given to T. Jules Husbands, RN. Questions asked and answered with verbalized understanding

## 2013-05-14 NOTE — H&P (Signed)
Agree with PA note.  Signed,  Asley Baskerville K. Aftan Vint, MD [facility]  

## 2013-05-14 NOTE — H&P (Signed)
Chief Complaint: "I'm here for a lung biopsy" Referring Physician:Gerhardt HPI: Mark Martin is an 57 y.o. male with findings of a left lung mass. He is referred and scheduled for CT guided biopsy today. PMHx and meds reviewed. He feels well today, no recent fevers, chills, illness. Has not taken ASA in about 5 days.   Past Medical History:  Past Medical History  Diagnosis Date  . Hyperlipidemia   . Hypertension   . BPH (benign prostatic hypertrophy)   . Mass of lung     RIGHT UPPER LOBE  . Diabetes mellitus without complication     TYPE 2  . 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 Glaucoma     Past Surgical History:  Past Surgical History  Procedure Laterality Date  . Coronary artery bypass graft      CABG X 5 08/14/2001  . Appendectomy      Family History:  Family History  Problem Relation Age of Onset  . Diabetes Mother   . Hypertension Father     Social History:  reports that he has never smoked. He does not have any smokeless tobacco history on file. He reports that he does not drink alcohol. His drug history is not on file.  Allergies: No Known Allergies  Medications:   Medication List    ASK your doctor about these medications       albuterol 108 (90 BASE) MCG/ACT inhaler  Commonly known as:  PROVENTIL HFA;VENTOLIN HFA  Inhale 2 puffs into the lungs every 6 (six) hours as needed for wheezing.     aspirin EC 325 MG tablet  Take 325 mg by mouth daily.     atorvastatin 40 MG tablet  Commonly known as:  LIPITOR  Take 40 mg by mouth daily.     hydrochlorothiazide 25 MG tablet  Commonly known as:  HYDRODIURIL  Take 25 mg by mouth 2 (two) times daily.     insulin NPH-regular (70-30) 100 UNIT/ML injection  Commonly known as:  NOVOLIN 70/30  Inject 150 Units into the skin 3 (three) times daily.     metFORMIN 500 MG (MOD) 24 hr tablet  Commonly  known as:  GLUMETZA  Take 1,000 mg by mouth 2 (two) times daily with a meal.     metoprolol 50 MG tablet  Commonly known as:  LOPRESSOR  Take 50 mg by mouth 2 (two) times daily.     ramipril 10 MG capsule  Commonly known as:  ALTACE  Take 10 mg by mouth 2 (two) times daily.     tamsulosin 0.4 MG Caps capsule  Commonly known as:  FLOMAX  Take 0.4 mg by mouth daily.        Please HPI for pertinent positives, otherwise complete 10 system ROS negative.  Physical Exam: BP 168/89  Pulse 70  Temp(Src) 98.1 F (36.7 C) (Oral)  Resp 18  Ht 5\' 8"  (1.727 m)  Wt 204 lb (92.534 kg)  BMI 31.03 kg/m2  SpO2 98% Body mass index is 31.03 kg/(m^2).   General Appearance:  Alert, cooperative, no distress, appears stated age  Head:  Normocephalic, without obvious abnormality, atraumatic  ENT: Unremarkable  Neck: Supple, symmetrical, trachea midline  Lungs:   Clear to auscultation bilaterally, no w/r/r, respirations unlabored without use of accessory muscles.  Chest Wall:  No  tenderness or deformity  Heart:  Regular rate and rhythm, S1, S2 normal, no murmur, rub or gallop.  Neurologic: Normal affect, no gross deficits.   Results for orders placed during the hospital encounter of 05/14/13 (from the past 48 hour(s))  CBC     Status: None   Collection Time    05/14/13  9:34 AM      Result Value Range   WBC 7.6  4.0 - 10.5 K/uL   RBC 5.08  4.22 - 5.81 MIL/uL   Hemoglobin 13.8  13.0 - 17.0 g/dL   HCT 16.1  09.6 - 04.5 %   MCV 81.1  78.0 - 100.0 fL   MCH 27.2  26.0 - 34.0 pg   MCHC 33.5  30.0 - 36.0 g/dL   RDW 40.9  81.1 - 91.4 %   Platelets 218  150 - 400 K/uL  GLUCOSE, CAPILLARY     Status: Abnormal   Collection Time    05/14/13  9:44 AM      Result Value Range   Glucose-Capillary 120 (*) 70 - 99 mg/dL   No results found.  Assessment/Plan Left lung mass For CT guided biopsy. Discussed procedure, risks, complications(bleeding, infection, PTX), use of sedation. Labs  reviewed. Consent signed in chart  Brayton El PA-C 05/14/2013, 9:59 AM

## 2013-05-14 NOTE — ED Notes (Signed)
Patient denies pain and is resting comfortably.  

## 2013-05-14 NOTE — Progress Notes (Signed)
Radiology called to give CXR report that is negative for pneumothorax. Pt taking po's without difficulty.

## 2013-05-14 NOTE — Procedures (Signed)
Interventional Radiology Procedure Note  Procedure: CT guided biopsy of LUL ground glass opacity. Complications: Tiny post biopsy PTX. Recommendations: - Bedrest until CXR cleared.  Minimize talking, coughing or otherwise straining.  - Follow up 1 and 2 hr CXR pending. - Sips & chips only until CXR are cleared.  Signed,  Sterling Big, MD [facility]

## 2013-05-19 ENCOUNTER — Encounter: Payer: Self-pay | Admitting: Cardiothoracic Surgery

## 2013-05-19 ENCOUNTER — Other Ambulatory Visit: Payer: Self-pay | Admitting: *Deleted

## 2013-05-19 ENCOUNTER — Ambulatory Visit (INDEPENDENT_AMBULATORY_CARE_PROVIDER_SITE_OTHER): Payer: Medicare Other | Admitting: Cardiothoracic Surgery

## 2013-05-19 DIAGNOSIS — Z7189 Other specified counseling: Secondary | ICD-10-CM

## 2013-05-19 DIAGNOSIS — C3410 Malignant neoplasm of upper lobe, unspecified bronchus or lung: Secondary | ICD-10-CM | POA: Diagnosis not present

## 2013-05-19 DIAGNOSIS — Z712 Person consulting for explanation of examination or test findings: Secondary | ICD-10-CM

## 2013-05-19 DIAGNOSIS — R918 Other nonspecific abnormal finding of lung field: Secondary | ICD-10-CM

## 2013-05-19 NOTE — Progress Notes (Signed)
[address]       [place] [address]             [phone]                      Mark Martin [facility] Medical Record #[id number] Date of Birth: [birth year]  Referring: Marcellus Scott, MD Primary Care: Charlott Rakes, MD  Chief Complaint:    Chief Complaint  Patient presents with  . Follow-up    to discuss needle bx of left lung mass    History of Present Illness:    Patient is a 57 year old nonsmoker who presents with a left lung mass found on chest x-ray and followed up with CT and PET. The patient was treated for a "pneumonic" process in December 2013 with antibiotics. In June he noted some increase in wheezing and cough and chest x-ray showed persistence of a left lung mass. Subsequent CT of the chest and PET scan was performed. Patient is referred for further evaluation of left lung mass. He denies any hemoptysis. He does have some shortness of breath with exertion but is without symptoms with usual activities. He had  coronary artery bypass grafting in 2002 and has had no cardiac events since that time. He saw Dr Tomie China last week in routine follow up.He has long-standing  Insulin dependent diabetes.  The patient notes that he is a lifelong NONsmoker, he denies any history of tuberculosis, he denies any work related to asbestos. He did work in [employer] with dust.   The patient returns today to review the results of needle bx done on left lung lesion last week and discuss treatment options  Current Activity/ Functional Status:  Patient is independent with mobility/ambulation, transfers, ADL's, IADL's.  Zubrod Score: At the time of surgery this patient's most appropriate activity status/level should be described as: []  Normal activity, no symptoms [x]  Symptoms, fully ambulatory []  Symptoms, in bed less than or equal to 50% of the time []  Symptoms, in bed greater than 50% of the time but less than 100% []  Bedridden []  Moribund   Past  Medical History  Diagnosis Date  . Hyperlipidemia   . Hypertension   . BPH (benign prostatic hypertrophy)   . Mass of lung     RIGHT UPPER LOBE  . Diabetes mellitus without complication     TYPE 2  . 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 Glaucoma     Past Surgical History  Procedure Laterality Date  . Coronary artery bypass graft      CABG X 5 08/14/2001  . Appendectomy      Family History: Patient's father is still alive at age 46 with mild hypertension, his mother died at age 73 of diabetes related complications. He has 3 children 2517 and 15 were all healthy.  History   Social History  . Marital Status: Married    Spouse Name:     Number of Children: 3  . Years of Education: N/A   Occupational History  .  currently on disability for diabetes and cardiac disease    Social History Main Topics  . Smoking status: Never Smoker   . Smokeless tobacco: Not on file  . Alcohol Use: No  . Drug Use: no  .  No Known Allergies  Current Outpatient Prescriptions  Medication Sig Dispense Refill  . albuterol (PROVENTIL HFA;VENTOLIN HFA) 108 (90 BASE) MCG/ACT inhaler Inhale 2 puffs into the lungs every 6 (six) hours as needed for wheezing.      Marland Kitchen aspirin EC 325 MG tablet Take 325 mg by mouth daily.      Marland Kitchen atorvastatin (LIPITOR) 40 MG tablet Take 40 mg by mouth daily.      . hydrochlorothiazide (HYDRODIURIL) 25 MG tablet Take 25 mg by mouth daily.      . insulin NPH-regular (NOVOLIN 70/30) (70-30) 100 UNIT/ML injection Inject 55 Units into the skin daily with breakfast. 44U NOON AND 55U QHS      . metFORMIN (GLUMETZA) 500 MG (MOD) 24 hr tablet Take 1,000 mg by mouth 2 (two) times daily with a meal.      . metoprolol (LOPRESSOR) 50 MG tablet Take 50 mg by mouth 2 (two) times daily.      . ramipril (ALTACE) 10 MG capsule Take 10 mg by mouth 2 (two) times daily.      .  tamsulosin (FLOMAX) 0.4 MG CAPS capsule Take by mouth.          Review of Systems:     Cardiac Review of Systems: Y or N  Chest Pain [  n  ]  Resting SOB [n   ] Exertional SOB  [ y ]  Orthopnea [ n ]   Pedal Edema [  Mild rt > left ]    Palpitations [n  ] Syncope  [ n ]   Presyncope [ n  ]  General Review of Systems: [Y] = yes [  ]=no Constitional: recent weight change [ n ]; anorexia [ n ]; fatigue [n  ]; nausea [ n ]; night sweats [  n]; fever [n  ]; or chills [ n ];                                                                                                                                          Dental: poor dentition[n  ]; Last Dentist visit:   Eye : blurred vision [ n ]; diplopia [   ]; vision changes [  ];  Amaurosis fugax[ n ]; Resp: cough [ y ];  wheezing[rare ];  hemoptysis[ n ]; shortness of breath[ y ]; paroxysmal nocturnal dyspnea[ n ]; dyspnea on exertion[ y ]; or orthopnea[ n ];  GI:  gallstones[ n ], vomiting[n  ];  dysphagia[ n ]; melena[ n;  hematochezia [  ]; heartburn[  ];   Hx of  Colonoscopy[ no ]; GU: kidney stones [ n ]; hematuria[n  ];   dysuria Cove.Etienne  ];  nocturia[  ];  history of     obstruction [ n ]; urinary frequency Cove.Etienne  ]             Skin:  rash, swelling[  ];, hair loss[  ];  peripheral edema[  ];  or itching[  ]; Musculosketetal: myalgias[  ];  joint swelling[  ];  joint erythema[  ];  joint pain[  ];  back pain[  ];  Heme/Lymph: bruising[  ];  bleeding[  ];  anemia[  ];  Neuro: TIA[  ];  headaches[  ];  stroke[  ];  vertigo[  ];  seizures[  ];   paresthesias[  ];  difficulty walking[  ];  Psych:depression[  ]; anxiety[  ];  Endocrine: diabetes[ y ];  thyroid dysfunction[ n ];  Immunizations: Flu Cove.Etienne  ]; Pneumococcal[ y ];  Other:  Physical Exam: BP 171/93  Pulse 71  Resp 16  Ht 5\' 8"  (1.727 m)  Wt 204 lb (92.534 kg)  BMI 31.03 kg/m2  SpO2 98%  General appearance: alert, cooperative, appears older than stated age and no distress Neurologic:  intact Heart: regular rate and rhythm, S1, S2 normal, no murmur, click, rub or gallop Lungs: clear to auscultation bilaterally and normal percussion bilaterally Abdomen: soft, non-tender; bowel sounds normal; no masses,  no organomegaly Extremities: extremities normal, atraumatic, no cyanosis or edema and Homans sign is negative, no sign of DVT Patient has no cervical or supraclavicular adenopathy, as probable DP and PT pulses, or scars on the medial aspect of his right ankle and knee from previous trauma and pinning  Diagnostic Studies & Laboratory data:     Recent Radiology Findings: Ct Biopsy  05/14/2013   *RADIOLOGY REPORT*  CT GUIDED LUNG BIOPSY  Date: 05/14/2013  Clinical History: 57 year old male with a persistent ground-glass attenuation mass-like consolidation in the left upper lobe concerning for primary lung adenocarcinoma.  CT guided biopsy is requested to facilitate tissue diagnosis.  Procedures Performed: 1. CT guided biopsy of left upper lobe mass  Interventional Radiologist:  Sterling Big, MD  Sedation: Moderate (conscious) sedation was used.  Six mg Versed, 200 mcg Fentanyl were administered intravenously.  The patient's vital signs were monitored continuously by radiology nursing throughout the procedure.  Sedation Time: 25 minutes  Fluoroscopy time: None  Contrast volume: None  PROCEDURE/FINDINGS:   Informed consent was obtained from the patient following explanation of the procedure, risks, benefits and alternatives. The patient understands, agrees and consents for the procedure. All questions were addressed. A time out was performed.  Maximal barrier sterile technique utilized including caps, mask, sterile gowns, sterile gloves, large sterile drape, hand hygiene, and betadine skin prep.  A planning axial CT scan was performed.  The ground-glass attenuation mass with internal air bronchograms in the periphery of the left upper lobe was successfully identified.  A suitable skin  entry site was selected and marked.  Local anesthesia was attained by infiltration with 1% lidocaine.  A small dermatotomy was made with a #11 blade.  Using CT fluoroscopic guidance, a 15 cm 17 gauge trocar needle was carefully advanced over a rib and into the periphery of the mass.  Several 18-gauge core biopsies were then coaxially obtained using the Biopince automated biopsy device.  The needle was removed.  Post biopsy axial CT imaging demonstrates trace alveolar hemorrhage posterior to the biopsy signed and a tiny peripheral and anterior pneumothorax.  The patient tolerated the procedure well.  IMPRESSION:  1.  Successful CT-guided biopsy of left upper lobe mass-like consolidation.  2.  Tiny postprocedural pneumothorax noted.  This will be followed closely with one and two hour post biopsy chest x-rays.  Signed,  Kandis Cocking.  Archer Asa, MD Vascular & Interventional Radiologist [facility]   Original Report Authenticated By: Malachy Moan, M.D.   Dg Chest 2v Repeat Same Day  05/14/2013   CLINICAL DATA:  Post left lung biopsy, evaluate for pneumothorax  EXAM: CHEST - 2 VIEW SAME DAY  COMPARISON:  05/14/2013  FINDINGS: No pneumothorax is seen status post biopsy of the left upper lobe pulmonary mass.  No pleural effusions.  Mild cardiomegaly. Postsurgical changes related to prior CABG.  IMPRESSION: No pneumothorax is seen status post biopsy of the left upper lobe pulmonary mass.   Electronically Signed   By: Charline Bills M.D.   On: 05/14/2013 15:20   Dg Chest Port 1 View  05/14/2013   CLINICAL DATA:  Status post left lung biopsy. Question pneumothorax.  EXAM: PORTABLE CHEST - 1 VIEW  COMPARISON:  CT images from biopsy earlier this same date. CT chest 03/02/2013.  FINDINGS: Left upper lobe mass is identified as on the prior studies. No pneumothorax is seen. Right lung is clear. There is cardiomegaly.  IMPRESSION: Negative for pneumothorax after biopsy of the left upper lobe mass. No acute  abnormality.   Electronically Signed   By: Drusilla Kanner M.D.   On: 05/14/2013 14:22   Clinical Data: Abnormal chest radiograph.  03/02/2013 CT CHEST WITHOUT CONTRAST  Technique: Multidetector CT imaging of the chest was performed following the standard protocol without IV contrast.  Comparison: Chest radiograph 02/23/2013.  Findings: No pathologically enlarged mediastinal or axillary lymph nodes. Hilar regions are difficult to definitively evaluate without IV contrast. Heart size normal. No pericardial effusion. Incidental note is made of mild bilateral gynecomastia.  Mass-like airspace consolidation in the lingula measures 2.6 x 3.3 cm and has a tag to the adjacent pleura. Finding corresponds to the questioned abnormality on recent chest radiograph. Minimal surrounding inflammatory change. Additional scattered pulmonary nodules measure up to 6 mm in the left lower lobe. Probable calcified granuloma in the right lower lobe. No pleural fluid. Airway is unremarkable.  Incidental imaging of the upper abdomen shows no acute findings. No worrisome lytic or sclerotic lesions. Degenerative changes are seen in the spine with prominent osteophytes.  IMPRESSION:  1. Mass-like consolidation in the lingula has an appearance indicative of primary bronchogenic carcinoma. Pneumonia cannot be excluded. Follow-up could be performed in 4-6 weeks, if there are infectious symptoms and after antibiotic therapy, as clinically indicated. A more aggressive approach would include consultation with pulmonary medicine or thoracic surgery. These results will be called to the ordering clinician or representative by the Radiologist Assistant, and communication documented in the PACS Dashboard. 2. Scattered pulmonary nodules measure up to 6 mm. Continued attention on follow-up exams is warranted. Original Report Authenticated By: Leanna Battles, M.D.   03/18/2013 NUCLEAR MEDICINE PET SKULL BASE TO  THIGH Fasting Blood Glucose: 210 Technique: 11.58 mCi F-18 FDG was injected intravenously. CT data was obtained and used for attenuation correction and anatomic localization only. (This was not acquired as a diagnostic CT examination.) Additional exam technical data entered on technologist worksheet.  Comparison: Chest CT 03/02/2013.  Findings:  Neck: No hypermetabolic lymph nodes in the neck.  Chest: Again noted is a 3.3 x 2.6 cm mass in the left upper lobe which has some internal air bronchograms. The periphery of this lesion has slightly spiculated margins with mild retraction of the adjacent pleural laterally. This lesion demonstrates only low- level metabolic activity (SUVmax = 2.1). No hypermetabolic mediastinal or hilar nodes. There is atherosclerosis of the thoracic aorta, the  great vessels of the mediastinum and the coronary arteries, including calcified atherosclerotic plaque in the left main, left anterior descending, left circumflex and right coronary arteries. Status post median sternotomy for CABG. No pleural effusions.  Abdomen/Pelvis: No abnormal hypermetabolic activity within the liver, pancreas, adrenal glands, or spleen. No hypermetabolic lymph nodes in the abdomen or pelvis. Small calcified gallstone in the neck of the gallbladder. No current findings to suggest acute cholecystitis at this time. Extensive atherosclerosis throughout the abdominal and pelvic vasculature, without definite aneurysm. There are a few scattered colonic diverticula, without surrounding inflammatory changes to suggest acute diverticulitis at this time. No significant volume of ascites. No pneumoperitoneum. No pathologic distension of small bowel. Prostate gland and urinary bladder are unremarkable in appearance.  Skeleton: No focal hypermetabolic activity to suggest skeletal metastasis.  IMPRESSION: 1. 3.3 x 2.6 cm left upper lobe mass has imaging characteristics compatible with  a slow growing adenocarcinoma. Low level metabolic activity within this lesion would be typical for such an entity, and given the persistence on chest radiographs compared to prior study from 08/26/2012, this is highly suspicious. Surgical consultation is strongly recommended. No evidence of nodal metastases in the hila or mediastinal stations. No evidence of distal metastatic disease at this time. Accordingly, this appears to represent T2a, N0, M0 disease (i.e., stage IB). 2. Atherosclerosis, including left main and three-vessel coronary artery disease. Status post median sternotomy for CABG. 3. Cholelithiasis without evidence to suggest acute cholecystitis at this time. 4. Mild colonic diverticulosis without evidence to suggest acute diverticulitis at this time. Original Report Authenticated By: Trudie Reed, M.D.   08/26/2012 Clinical Data: Wheezing. Cough and congestion.  CHEST - 2 VIEW Comparison: 08/24/2004 Findings: Mild cardiac enlargement. Previous median sternotomy and CABG procedure. Opacity within the left upper lobe is identified and appears new from previous exam. This may represent early or resolving pneumonia. No effusions or edema identified.  IMPRESSION: 1. Left upper lobe opacity which may represent pneumonia. Radiographic followup is recommended to ensure resolution. Original Report Authenticated By: Signa Kell, M.D.  PFT's screening 03/04/2013   FEV1 2.27   72% no diffusion capacity  Full PFT's 9/14 : FEV1 2.15 62%  DLCO 25.7   86%   Recent Lab Findings: Lab Results  Component Value Date   WBC 7.6 05/14/2013   Path: [facility] : [id number]  Diagnosis Lung, needle/core biopsy(ies), Left upper lobe pulmonary nodule - POSITIVE FOR ADENOCARCINOMA   Assessment / Plan:   #1 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 #2 history of previous coronary artery bypass grafting #3 diabetes  mellitus  I discussed with the patient the possible diagnoses of the left lung cancer  and treatment options. I recommended to the patient that we proceed with Bronchoscopy and left VATS and lung resection/probable left upper lobectomy y. Left upper lobectomy may be complicated by his previous bypass surgery and use of left internal mammary artery. The risks and expectations of surgical resection has been discussed with the patient, and he is will to proceed with surgical resection. He wishes to wait until after Eid al-Adha. We will plan to proceed with surgery OCT 7.  Delight Ovens MD  [address] [place] [place] [address] Office [phone]   Beeper [phone]   05/19/2013 3:26 PM

## 2013-05-19 NOTE — Patient Instructions (Addendum)
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 When an entire lung is removed, the procedure is called a pneumonectomy. When only part of a lung is removed, the procedure is called a lobectomy. A lung resection is typically done to get rid of a tumor or cancer. This surgery can help relieve some or all of your symptoms. The surgery can also help keep the problem from getting worse. It may provide the best chance for curing your disease. However, surgery may not necessarily cure lung cancer, if that is the problem. Most people need to stay in the hospital for several days after this procedure.  LET YOUR CAREGIVER KNOW ABOUT:  Allergies to food or medicine.  Medicines taken, including vitamins, herbs, eyedrops, over-the-counter medicines, and creams.  Use of steroids (by mouth or creams).  Previous problems with anesthetics or numbing medicines.  History of bleeding problems or blood clots.  Previous surgery.  Other health problems, including diabetes and kidney problems.  Possibility of pregnancy, if this applies. RISKS AND COMPLICATIONS  Lung resections have been done for many years with good results and few complications. However, all surgery is associated with possible risks. Some of these risks are:  Excessive bleeding.  Infection.  Inability to breath without a ventilator.  Persistent shortness of breath.  Heart problems, including abnormal rhythms and a risk of heart attack or heart failure.  Blood clots.  Injury to a blood vessel.  Injury to a nerve.  Failure to heal properly.  Stroke.  Bronchopleural fistula. This is a small hole between one of the main breathing tubes and the lining of the lungs. BEFORE THE PROCEDURE   In order to prepare for surgery, your caregiver may ask for several tests to be done. These may include:  Blood tests.  Urine tests.  X-rays.  Imaging tests, such as CT scans, MRI scans, and PET scans. These tests are done to find the exact size and location of the tumor that will be removed.  Pulmonary function tests (PFTs). These are breathing tests to assess the function of your lungs before surgery and to decide how to best help your breathing after surgery.  Heart testing. This is done to make sure your heart is strong enough for the procedure.  Bronchoscopy. This is a technique that allows your caregiver to look at the inside of your airways. This is done using a soft, flexible tube (bronchoscope). Along with imaging tests, this can help your caregiver know the exact location and size of the area that will be removed during surgery.  Lymph node sampling. This may need to be done to see if the tumor has spread. It may be done as a separate surgery or right before your lung resection procedure. PROCEDURE  An intravenous line (IV) will be placed in your arm. You will be given medicine that makes you sleep (general anesthetic).  Once you are asleep, a breathing tube is placed into your windpipe. You may also get pain medicine through a thin, flexible tube (catheter) in your back. The catheter is put through your skin and next to your spinal cord, where it releases anesthetic medicine.  Next, you will be turned onto your side. This makes it easier for  your surgeon to reach the area of your ribcage where the surgical cut (incision) will be made. This area is washed with a disinfectant solution and might also be shaved. A catheter will be put into your bladder to collect urine. Another tube will be carefully passed through your throat and into your stomach.  The surgeon will make an incision on your side, which will start between two of your ribs and go around to your back. Your ribs will  be spread and held open. Part of one rib may be removed to make it easier for the surgeon to reach your lung.  Your surgeon will carefully cut the veins, arteries, and bronchus leading to the lung. After being cut, each of these pieces will be sewn or stapled closed. Then, the lung or part of the lung will be removed.  Your surgeon will check inside your chest to make sure there is no bleeding in or around the lungs. Lymph nodes near the lung may also be removed for later tests. This is done to check if your problems have spread to the lymph nodes.  Depending on your situation, your surgeon may put tubes into your chest to drain extra fluid and air from the chest cavity after surgery. After the tubes are in, your ribcage will be closed with stitches. The stitches help your ribcage heal and keep it from moving. After this, the layers of tissue under the skin are closed with more stitches, which will dissolve inside your body over time. Finally, your skin is closed with stitches or staples and covered with a bandage. AFTER THE PROCEDURE   After surgery, you will be taken to the recovery area where a nurse will monitor your progress. You may still have a breathing tube, spinal catheter, bladder catheter, stomach tube, and possibly chest tubes inside your body. These will be removed during your recovery. You may be put on a respirator following surgery if some assistance is needed to help your breathing. When you are awake, stable, and without complications, you will likely continue recovery in the intensive care unit (ICU).  As you wake up, you might feel some aches and pains in your chest and throat. Sometimes during recovery, patients may shiver or feel nauseous. Both of these symptoms are temporary and may be caused by the anesthesia. Your caregivers can give you medicine to help these problems go away.  The breathing tube will be taken out as soon as your caregivers feel you can breathe on your own.  For most people, this happens on the same day as the surgery.  If your surgery and time in the ICU go well, most of the tubes and equipment will be taken out within the first 1 to 2 days after surgery. This is about how long most people stay in the ICU. You may need to stay longer, depending on how you are doing.  You should also start respiratory therapy in the ICU. This therapy uses breathing exercises to help your other lung stay healthy and get stronger.  As you improve, you will be moved to a regular hospital room for continued respiratory therapy, help with your bladder and bowels, and to continue medicines. Most people stay in the hospital for 5 to 7 days. However, your stay may be longer, depending on how your surgery went and how well you are doing.  After your lung or part of your lung is taken out, there will be a space inside your chest. This space  will often fill up with fluid over time. The amount of time this takes is different for each person. Because your chest needs to fill with fluid, your surgeon may or may not put a drainage tube in your chest. If there is a chest tube, it will most likely be removed within 24 hours after the surgery.  You will receive care until you are doing well and your caregiver feels it is safe for you to go home or to transfer to an extended care facility. Document Released: 11/03/2002 Document Revised: 11/05/2011 Document Reviewed: 04/12/2011 [facility] Patient Information 2014 [place], [place].  Lung Cancer Lung cancer is a tumor which starts as a growth in your lungs. Cancer is a group of many related diseases that begin in cells, the building blocks of the body. Normally, cells grow and divide to produce more cells only when the body needs them. Sometimes cells keep dividing when new cells are not needed. These extra cells may form a mass of tissue called a growth or tumor. Tumors can be either benign (not cancerous) or malignant (cancerous). Cancer can  begin in any organ or tissue of the body. The original tumor (where the tumor started out) is called the primary cancer and is usually named for where it begins.  Lung cancer is the most common cause of cancer death in men and women. There are several different types of lung cancers. Usually, lung cancer is described as either small-cell lung cancer or non-small-cell lung cancer. Other types of cancer occur in the lungs, including carcinoid and cancers spread from other organs. The types of cancer have different behavior and treatment. CAUSES  This cancer usually starts when the lungs are exposed to harmful chemicals. When you quit smoking, your risk of lung cancer falls each year (but is never the same as a person who has never smoked).  Other risks include:   Radon gas exposure.  Asbestos and other industrial substance exposure.  Second hand tobacco smoke.  Air pollution.  Family or personal history of lung cancer.  Age over 76. SYMPTOMS  Lung cancer can cause many symptoms. They depend on the type of cancer, its location and other factors. Symptoms of lung cancer can include:  Cough (either new, different or more severe).  Shortness of breath.  Coughing up blood (hemoptysis).  Chest pain.  Hoarseness.  Swelling of the face.  Drooping eyelid.  Changes in blood tests: low sodium (hyponatremia), high calcium (hypercalcemia) or low blood count (anemia).  Weight loss. In its early stages, lung cancer may not have symptoms and can be discovered by accident. Many of the symptoms above can be caused by diseases other than lung cancer. DIAGNOSIS  In early lung cancer, the patient often does not notice problems. It usually has spread by the time problems are first noticed. Your caregiver may suspect lung cancer based on your symptoms, your exam or based on tests (such as x-rays) obtained for other reasons. Common tests that help your caregiver diagnose your condition  include:  Chest x-ray.  CT scan of the lungs and chest.  Blood tests. If a tumor is found, a biopsy will be necessary to confirm that cancer is present and to determine the type of cancer. TREATMENT   Surgery offers a hope for a cure if the cancer has not spread and the cancer is not a small cell (oat cell) cancer of the lung. Surgery cannot cure the small cell type of cancer.  Radiation Therapy is a form  of high energy X-ray that helps slow or kill the cancer. It is often used along with medications (chemotherapy) to help treat the cancer and control pain.  Chemotherapy is used in combination with surgery in advanced cancer. It is also used in all small cell cancers.  Many new treatments look promising.  Your caregiver can give you more information and discuss treatment options that are best for your type of cancer. HOME CARE INSTRUCTIONS   If you smoke, stop!  Take all medications as told.  Keep all appointments with your caregiver and other specialists.  Ask your caregiver if you should see a cancer specialist, if that has not been arranged.  If you require oxygen or breathing equipment, be sure you know how to use it and who to call with questions.  Follow any special diet directions. If you have problems with appetite, ask your caregiver for help. SEEK MEDICAL CARE IF:   You have had a surgical procedure are you are having trouble recovering.  You have ongoing weight loss.  You have decreased strength or energy past the point when your caregiver said you would feel better.  You develop nausea or lightheadedness.  You have pain that is not improving. SEEK IMMEDIATE MEDICAL CARE IF:   You cough up clotted blood or bright red blood.  Your pain is uncontrolled.  You develop new difficulty breathing or chest pain.  You develop swelling in one or both ankles or legs, or swelling in your face or neck.  You develop new headache or confusion. Document Released:  11/19/2000 Document Revised: 11/05/2011 Document Reviewed: 08/30/2008 [facility] Patient Information 2014 [place], [place].

## 2013-05-20 DIAGNOSIS — C3490 Malignant neoplasm of unspecified part of unspecified bronchus or lung: Secondary | ICD-10-CM | POA: Diagnosis not present

## 2013-05-28 ENCOUNTER — Encounter (HOSPITAL_COMMUNITY): Payer: Self-pay | Admitting: Pharmacy Technician

## 2013-05-28 DIAGNOSIS — I2510 Atherosclerotic heart disease of native coronary artery without angina pectoris: Secondary | ICD-10-CM | POA: Diagnosis not present

## 2013-05-29 ENCOUNTER — Ambulatory Visit (HOSPITAL_COMMUNITY)
Admission: RE | Admit: 2013-05-29 | Discharge: 2013-05-29 | Disposition: A | Discharge status: Home / Self Care | Payer: Medicare Other | Source: Ambulatory Visit | Attending: Cardiothoracic Surgery | Admitting: Cardiothoracic Surgery

## 2013-05-29 ENCOUNTER — Encounter (HOSPITAL_COMMUNITY): Payer: Self-pay

## 2013-05-29 ENCOUNTER — Encounter (HOSPITAL_COMMUNITY)
Admission: RE | Admit: 2013-05-29 | Discharge: 2013-05-29 | Disposition: A | Discharge status: Home / Self Care | Payer: Medicare Other | Source: Ambulatory Visit | Attending: Cardiothoracic Surgery | Admitting: Cardiothoracic Surgery

## 2013-05-29 VITALS — BP 160/92 | HR 66 | Temp 98.0°F | Resp 18 | Ht 68.0 in | Wt 203.9 lb

## 2013-05-29 DIAGNOSIS — R918 Other nonspecific abnormal finding of lung field: Secondary | ICD-10-CM

## 2013-05-29 DIAGNOSIS — I517 Cardiomegaly: Secondary | ICD-10-CM | POA: Diagnosis not present

## 2013-05-29 DIAGNOSIS — Z01818 Encounter for other preprocedural examination: Secondary | ICD-10-CM | POA: Insufficient documentation

## 2013-05-29 DIAGNOSIS — R222 Localized swelling, mass and lump, trunk: Secondary | ICD-10-CM | POA: Insufficient documentation

## 2013-05-29 DIAGNOSIS — I440 Atrioventricular block, first degree: Secondary | ICD-10-CM | POA: Diagnosis not present

## 2013-05-29 DIAGNOSIS — Z01810 Encounter for preprocedural cardiovascular examination: Secondary | ICD-10-CM | POA: Insufficient documentation

## 2013-05-29 DIAGNOSIS — R9431 Abnormal electrocardiogram [ECG] [EKG]: Secondary | ICD-10-CM | POA: Insufficient documentation

## 2013-05-29 DIAGNOSIS — Z01812 Encounter for preprocedural laboratory examination: Secondary | ICD-10-CM | POA: Diagnosis not present

## 2013-05-29 HISTORY — DX: Other specified postprocedural states: Z98.890

## 2013-05-29 HISTORY — DX: Unspecified osteoarthritis, unspecified site: M19.90

## 2013-05-29 HISTORY — DX: Unspecified injury of unspecified ankle, initial encounter: S99.919A

## 2013-05-29 HISTORY — DX: Shortness of breath: R06.02

## 2013-05-29 LAB — PROTIME-INR
INR: 1.04 (ref 0.00–1.49)
Prothrombin Time: 13.4 seconds (ref 11.6–15.2)

## 2013-05-29 LAB — URINALYSIS, ROUTINE W REFLEX MICROSCOPIC
Bilirubin Urine: NEGATIVE
Glucose, UA: 100 mg/dL — AB
Hgb urine dipstick: NEGATIVE
Ketones, ur: NEGATIVE mg/dL
Leukocytes, UA: NEGATIVE
Nitrite: NEGATIVE
Protein, ur: NEGATIVE mg/dL
Specific Gravity, Urine: 1.016 (ref 1.005–1.030)
Urobilinogen, UA: 0.2 mg/dL (ref 0.0–1.0)
pH: 5.5 (ref 5.0–8.0)

## 2013-05-29 LAB — BLOOD GAS, ARTERIAL
Acid-base deficit: 1.6 mmol/L (ref 0.0–2.0)
Bicarbonate: 22.7 mEq/L (ref 20.0–24.0)
Drawn by: 206361
FIO2: 0.21 %
O2 Saturation: 95 %
Patient temperature: 98.6
TCO2: 23.9 mmol/L (ref 0–100)
pCO2 arterial: 38.7 mmHg (ref 35.0–45.0)
pH, Arterial: 7.386 (ref 7.350–7.450)
pO2, Arterial: 76.1 mmHg — ABNORMAL LOW (ref 80.0–100.0)

## 2013-05-29 LAB — COMPREHENSIVE METABOLIC PANEL
ALT: 21 U/L (ref 0–53)
AST: 20 U/L (ref 0–37)
Albumin: 3.8 g/dL (ref 3.5–5.2)
Alkaline Phosphatase: 124 U/L — ABNORMAL HIGH (ref 39–117)
BUN: 16 mg/dL (ref 6–23)
CO2: 21 mEq/L (ref 19–32)
Calcium: 9.5 mg/dL (ref 8.4–10.5)
Chloride: 96 mEq/L (ref 96–112)
Creatinine, Ser: 0.89 mg/dL (ref 0.50–1.35)
GFR calc Af Amer: >90 mL/min (ref >90)
GFR calc non Af Amer: >90 mL/min (ref >90)
Glucose, Bld: 213 mg/dL — ABNORMAL HIGH (ref 70–99)
Potassium: 3.8 mEq/L (ref 3.5–5.1)
Sodium: 132 mEq/L — ABNORMAL LOW (ref 135–145)
Total Bilirubin: 0.4 mg/dL (ref 0.3–1.2)
Total Protein: 7.8 g/dL (ref 6.0–8.3)

## 2013-05-29 LAB — ABO/RH: ABO/RH(D): O POS

## 2013-05-29 LAB — CBC
HCT: 41.4 % (ref 39.0–52.0)
Hemoglobin: 14.5 g/dL (ref 13.0–17.0)
MCH: 28.1 pg (ref 26.0–34.0)
MCHC: 35 g/dL (ref 30.0–36.0)
MCV: 80.2 fL (ref 78.0–100.0)
Platelets: 230 10*3/uL (ref 150–400)
RBC: 5.16 MIL/uL (ref 4.22–5.81)
RDW: 14.4 % (ref 11.5–15.5)
WBC: 10.1 10*3/uL (ref 4.0–10.5)

## 2013-05-29 LAB — SURGICAL PCR SCREEN: Staphylococcus aureus: NEGATIVE

## 2013-05-29 LAB — APTT: aPTT: 27 seconds (ref 24–37)

## 2013-05-29 NOTE — Pre-Procedure Instructions (Signed)
Mark Martin  05/29/2013   Your procedure is scheduled on:  Tuesday, October 7th   Report to [facility]  2 * 3 at  5:30 AM.             Bonita Quin will come in through Entrance "A[address]  Call this number if you have problems the morning of surgery: [phone]   Remember:   Do not eat food or drink liquids after midnight Monday.   Take these medicines the morning of surgery with A SIP OF WATER: Albuterol inhaler, Metoprolol   Do not wear jewelry.  Do not wear lotions, powders, or colognes. You may NOT  wear deodorant.              Men may shave face and neck.  Do not bring valuables to the hospital.  [facility] is not responsible for any belongings or valuables.               Contacts, dentures or bridgework may not be worn into surgery.  Leave suitcase in the car. After surgery it may be brought to your room.  For patients admitted to the hospital, discharge time is determined by your treatment team.    Name and phone number of your driver:    Special Instructions: Shower using CHG 2 nights before surgery and the night before surgery.  If you shower the day of surgery use CHG.  Use special wash - you have one bottle of CHG for all showers.  You should use approximately 1/3 of the bottle for each shower.   Please read over the following fact sheets that you were given: Pain Booklet, Coughing and Deep Breathing, Blood Transfusion Information, MRSA Information and Surgical Site Infection Prevention

## 2013-05-29 NOTE — Progress Notes (Signed)
[facility] in [place] closes on Friday at 12:00.   Please call [phone] and get an old EKG and cardiac notes, and then show to Shonna Chock, [place].    DA

## 2013-06-01 MED ORDER — CEFUROXIME SODIUM 1.5 G IJ SOLR
1.5000 g | INTRAMUSCULAR | Status: AC
  Administered 2013-06-02: 1.5 g via INTRAVENOUS
  Filled 2013-06-01: qty 1.5

## 2013-06-01 NOTE — Progress Notes (Signed)
Anesthesia Chart Review:  Patient is a 57 year old male scheduled for bronchoscopy, left VATS for lung resection of a LUL lung mass on 06/02/13 by Dr. Tyrone Sage.  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  PCP is Dr. Yetta Flock.  Cardiologist is Dr. Tomie China.  Pulmonologist is Dr. Blenda Nicely.  EKG on 05/29/13 showed SR with first degree AVB, possible LAE, inferior infarct (age undetermined), anterior infarct (age undetermined), ST/T wave abnormality, consider lateral ischemia.  R wave progression in V4-6 is improved, but T wave abnormality new in V4-6 (same in I, aVL) since 10/02/12.  Nuclear stress test [facility]) on 05/28/13 was negative for ischemia, hypokinesis involving the inferior wall, EF 41%.  Preoperative CXR and labs noted. PFT's 05/10/13 : FEV1 2.15 63%, DLCO 25.7 86%.  Patient had a non-ischemia stress test last week.  If no acute changes then I would anticipate that he could proceed as planned.  Velna Ochs [facility] Short Stay Center/Anesthesiology Phone [phone] 06/01/2013 10:52 AM

## 2013-06-02 ENCOUNTER — Inpatient Hospital Stay (HOSPITAL_COMMUNITY)
Admission: RE | Admit: 2013-06-02 | Discharge: 2013-06-08 | DRG: 164 | Disposition: A | Discharge status: Home / Self Care | Payer: Medicare Other | Source: Ambulatory Visit | Attending: Cardiothoracic Surgery | Admitting: Cardiothoracic Surgery

## 2013-06-02 ENCOUNTER — Encounter (HOSPITAL_COMMUNITY)
Admission: RE | Disposition: A | Discharge status: Home / Self Care | Payer: Self-pay | Source: Ambulatory Visit | Attending: Cardiothoracic Surgery

## 2013-06-02 ENCOUNTER — Inpatient Hospital Stay (HOSPITAL_COMMUNITY): Payer: Medicare Other | Admitting: Anesthesiology

## 2013-06-02 ENCOUNTER — Encounter (HOSPITAL_COMMUNITY): Payer: Self-pay | Admitting: *Deleted

## 2013-06-02 ENCOUNTER — Inpatient Hospital Stay (HOSPITAL_COMMUNITY): Payer: Medicare Other

## 2013-06-02 ENCOUNTER — Encounter (HOSPITAL_COMMUNITY): Payer: Self-pay | Admitting: Vascular Surgery

## 2013-06-02 DIAGNOSIS — I2510 Atherosclerotic heart disease of native coronary artery without angina pectoris: Secondary | ICD-10-CM | POA: Diagnosis present

## 2013-06-02 DIAGNOSIS — Z951 Presence of aortocoronary bypass graft: Secondary | ICD-10-CM | POA: Diagnosis not present

## 2013-06-02 DIAGNOSIS — J9383 Other pneumothorax: Secondary | ICD-10-CM | POA: Diagnosis not present

## 2013-06-02 DIAGNOSIS — D62 Acute posthemorrhagic anemia: Secondary | ICD-10-CM | POA: Diagnosis not present

## 2013-06-02 DIAGNOSIS — J9819 Other pulmonary collapse: Secondary | ICD-10-CM | POA: Diagnosis not present

## 2013-06-02 DIAGNOSIS — E876 Hypokalemia: Secondary | ICD-10-CM | POA: Diagnosis not present

## 2013-06-02 DIAGNOSIS — J90 Pleural effusion, not elsewhere classified: Secondary | ICD-10-CM | POA: Diagnosis not present

## 2013-06-02 DIAGNOSIS — E119 Type 2 diabetes mellitus without complications: Secondary | ICD-10-CM | POA: Diagnosis present

## 2013-06-02 DIAGNOSIS — N401 Enlarged prostate with lower urinary tract symptoms: Secondary | ICD-10-CM | POA: Diagnosis present

## 2013-06-02 DIAGNOSIS — D381 Neoplasm of uncertain behavior of trachea, bronchus and lung: Secondary | ICD-10-CM

## 2013-06-02 DIAGNOSIS — R222 Localized swelling, mass and lump, trunk: Secondary | ICD-10-CM | POA: Diagnosis not present

## 2013-06-02 DIAGNOSIS — R0989 Other specified symptoms and signs involving the circulatory and respiratory systems: Secondary | ICD-10-CM | POA: Diagnosis not present

## 2013-06-02 DIAGNOSIS — E785 Hyperlipidemia, unspecified: Secondary | ICD-10-CM | POA: Diagnosis present

## 2013-06-02 DIAGNOSIS — C3410 Malignant neoplasm of upper lobe, unspecified bronchus or lung: Principal | ICD-10-CM | POA: Diagnosis present

## 2013-06-02 DIAGNOSIS — M129 Arthropathy, unspecified: Secondary | ICD-10-CM | POA: Diagnosis present

## 2013-06-02 DIAGNOSIS — I700 Atherosclerosis of aorta: Secondary | ICD-10-CM | POA: Diagnosis present

## 2013-06-02 DIAGNOSIS — N138 Other obstructive and reflux uropathy: Secondary | ICD-10-CM | POA: Diagnosis present

## 2013-06-02 DIAGNOSIS — I10 Essential (primary) hypertension: Secondary | ICD-10-CM | POA: Diagnosis present

## 2013-06-02 DIAGNOSIS — Z794 Long term (current) use of insulin: Secondary | ICD-10-CM | POA: Diagnosis not present

## 2013-06-02 DIAGNOSIS — C3492 Malignant neoplasm of unspecified part of left bronchus or lung: Secondary | ICD-10-CM | POA: Diagnosis present

## 2013-06-02 DIAGNOSIS — R918 Other nonspecific abnormal finding of lung field: Secondary | ICD-10-CM

## 2013-06-02 DIAGNOSIS — Z4682 Encounter for fitting and adjustment of non-vascular catheter: Secondary | ICD-10-CM | POA: Diagnosis not present

## 2013-06-02 DIAGNOSIS — N400 Enlarged prostate without lower urinary tract symptoms: Secondary | ICD-10-CM | POA: Diagnosis not present

## 2013-06-02 DIAGNOSIS — J95811 Postprocedural pneumothorax: Secondary | ICD-10-CM | POA: Diagnosis not present

## 2013-06-02 HISTORY — PX: VIDEO ASSISTED THORACOSCOPY (VATS)/WEDGE RESECTION: SHX6174

## 2013-06-02 HISTORY — PX: THORACOTOMY/LOBECTOMY: SHX6116

## 2013-06-02 HISTORY — PX: VIDEO BRONCHOSCOPY: SHX5072

## 2013-06-02 LAB — GLUCOSE, CAPILLARY
Glucose-Capillary: 270 mg/dL — ABNORMAL HIGH (ref 70–99)
Glucose-Capillary: 312 mg/dL — ABNORMAL HIGH (ref 70–99)
Glucose-Capillary: 359 mg/dL — ABNORMAL HIGH (ref 70–99)
Glucose-Capillary: 282 mg/dL — ABNORMAL HIGH (ref 70–99)
Glucose-Capillary: 313 mg/dL — ABNORMAL HIGH (ref 70–99)

## 2013-06-02 LAB — POCT I-STAT 7, (LYTES, BLD GAS, ICA,H+H)
Acid-base deficit: 3 mmol/L — ABNORMAL HIGH (ref 0.0–2.0)
Bicarbonate: 23.8 mEq/L (ref 20.0–24.0)
Calcium, Ion: 1.12 mmol/L (ref 1.12–1.23)
HCT: 43 % (ref 39.0–52.0)
Hemoglobin: 14.6 g/dL (ref 13.0–17.0)
O2 Saturation: 98 %
Potassium: 4.4 mEq/L (ref 3.5–5.1)
Sodium: 133 mEq/L — ABNORMAL LOW (ref 135–145)
TCO2: 25 mmol/L (ref 0–100)
pCO2 arterial: 50 mmHg — ABNORMAL HIGH (ref 35.0–45.0)
pH, Arterial: 7.285 — ABNORMAL LOW (ref 7.350–7.450)
pO2, Arterial: 114 mmHg — ABNORMAL HIGH (ref 80.0–100.0)

## 2013-06-02 LAB — PREPARE RBC (CROSSMATCH)

## 2013-06-02 SURGERY — BRONCHOSCOPY, VIDEO-ASSISTED
Anesthesia: General | Site: Mouth | Wound class: Clean Contaminated

## 2013-06-02 MED ORDER — ONDANSETRON HCL 4 MG/2ML IJ SOLN
INTRAMUSCULAR | Status: DC | PRN
  Administered 2013-06-02: 4 mg via INTRAMUSCULAR

## 2013-06-02 MED ORDER — ONDANSETRON HCL 4 MG/2ML IJ SOLN
4.0000 mg | Freq: Four times a day (QID) | INTRAMUSCULAR | Status: DC | PRN
  Administered 2013-06-02 – 2013-06-04 (×4): 4 mg via INTRAVENOUS

## 2013-06-02 MED ORDER — TRAMADOL HCL 50 MG PO TABS
50.0000 mg | ORAL_TABLET | Freq: Four times a day (QID) | ORAL | Status: DC | PRN
  Administered 2013-06-03 – 2013-06-06 (×5): 100 mg via ORAL
  Filled 2013-06-02 (×5): qty 2

## 2013-06-02 MED ORDER — DIPHENHYDRAMINE HCL 50 MG/ML IJ SOLN
12.5000 mg | Freq: Four times a day (QID) | INTRAMUSCULAR | Status: DC | PRN
  Administered 2013-06-05 – 2013-06-07 (×3): 12.5 mg via INTRAVENOUS
  Filled 2013-06-02 (×3): qty 1

## 2013-06-02 MED ORDER — DIPHENHYDRAMINE HCL 12.5 MG/5ML PO ELIX
12.5000 mg | ORAL_SOLUTION | Freq: Four times a day (QID) | ORAL | Status: DC | PRN
  Filled 2013-06-02: qty 5

## 2013-06-02 MED ORDER — DUTASTERIDE 0.5 MG PO CAPS
0.5000 mg | ORAL_CAPSULE | Freq: Every day | ORAL | Status: DC
  Administered 2013-06-03 – 2013-06-07 (×5): 0.5 mg via ORAL
  Filled 2013-06-02 (×9): qty 1

## 2013-06-02 MED ORDER — GLYCOPYRROLATE 0.2 MG/ML IJ SOLN
INTRAMUSCULAR | Status: DC | PRN
  Administered 2013-06-02: 0.6 mg via INTRAVENOUS

## 2013-06-02 MED ORDER — ATORVASTATIN CALCIUM 40 MG PO TABS
40.0000 mg | ORAL_TABLET | Freq: Every day | ORAL | Status: DC
  Administered 2013-06-03 – 2013-06-07 (×5): 40 mg via ORAL
  Filled 2013-06-02 (×6): qty 1

## 2013-06-02 MED ORDER — SODIUM CHLORIDE 0.9 % IJ SOLN: 9.0000 mL | INTRAMUSCULAR | Status: DC | PRN

## 2013-06-02 MED ORDER — INSULIN ASPART 100 UNIT/ML ~~LOC~~ SOLN
SUBCUTANEOUS | Status: AC
  Administered 2013-06-02: 15 [IU] via SUBCUTANEOUS
  Filled 2013-06-02: qty 15

## 2013-06-02 MED ORDER — BUPIVACAINE 0.5 % ON-Q PUMP SINGLE CATH 400 ML
400.0000 mL | INJECTION | Status: AC
  Filled 2013-06-02: qty 400

## 2013-06-02 MED ORDER — ACETAMINOPHEN 160 MG/5ML PO SOLN
1000.0000 mg | Freq: Four times a day (QID) | ORAL | Status: AC
  Administered 2013-06-03: 1000 mg via ORAL
  Filled 2013-06-02: qty 40.6

## 2013-06-02 MED ORDER — METOPROLOL TARTRATE 50 MG PO TABS
50.0000 mg | ORAL_TABLET | Freq: Two times a day (BID) | ORAL | Status: DC
  Administered 2013-06-03 – 2013-06-08 (×11): 50 mg via ORAL
  Filled 2013-06-02 (×12): qty 1

## 2013-06-02 MED ORDER — 0.9 % SODIUM CHLORIDE (POUR BTL) OPTIME
TOPICAL | Status: DC | PRN
  Administered 2013-06-02: 2000 mL

## 2013-06-02 MED ORDER — BUPIVACAINE 0.5 % ON-Q PUMP SINGLE CATH 400 ML
400.0000 mL | INJECTION | Status: DC
  Filled 2013-06-02: qty 400

## 2013-06-02 MED ORDER — OXYCODONE HCL 5 MG PO TABS: 5.0000 mg | ORAL_TABLET | ORAL | Status: AC | PRN

## 2013-06-02 MED ORDER — DUTASTERIDE-TAMSULOSIN HCL 0.5-0.4 MG PO CAPS: 1.0000 | ORAL_CAPSULE | Freq: Every day | ORAL | Status: DC

## 2013-06-02 MED ORDER — SENNOSIDES-DOCUSATE SODIUM 8.6-50 MG PO TABS
1.0000 | ORAL_TABLET | Freq: Every evening | ORAL | Status: DC | PRN
  Administered 2013-06-07: 1 via ORAL
  Filled 2013-06-02: qty 1

## 2013-06-02 MED ORDER — FENTANYL 10 MCG/ML IV SOLN
INTRAVENOUS | Status: DC
  Administered 2013-06-02: 23:00:00 via INTRAVENOUS
  Administered 2013-06-03: 105 ug via INTRAVENOUS
  Administered 2013-06-03: 15 ug via INTRAVENOUS
  Administered 2013-06-04: 45 ug via INTRAVENOUS
  Administered 2013-06-04: 30 ug via INTRAVENOUS
  Administered 2013-06-04: 75 ug via INTRAVENOUS
  Administered 2013-06-04 – 2013-06-05 (×4): 30 ug via INTRAVENOUS
  Administered 2013-06-05: 05:00:00 via INTRAVENOUS
  Administered 2013-06-05: 45 ug via INTRAVENOUS
  Administered 2013-06-05: 35 ug via INTRAVENOUS
  Administered 2013-06-06: 60 ug via INTRAVENOUS
  Filled 2013-06-02 (×2): qty 50

## 2013-06-02 MED ORDER — OXYCODONE-ACETAMINOPHEN 5-325 MG PO TABS: 1.0000 | ORAL_TABLET | ORAL | Status: DC | PRN

## 2013-06-02 MED ORDER — MIDAZOLAM HCL 5 MG/5ML IJ SOLN
INTRAMUSCULAR | Status: DC | PRN
  Administered 2013-06-02: 2 mg via INTRAVENOUS

## 2013-06-02 MED ORDER — NEOSTIGMINE METHYLSULFATE 1 MG/ML IJ SOLN
INTRAMUSCULAR | Status: DC | PRN
  Administered 2013-06-02: 4 mg via INTRAVENOUS

## 2013-06-02 MED ORDER — BISACODYL 5 MG PO TBEC
10.0000 mg | DELAYED_RELEASE_TABLET | Freq: Every day | ORAL | Status: DC
  Administered 2013-06-03 – 2013-06-08 (×4): 10 mg via ORAL
  Filled 2013-06-02 (×5): qty 2

## 2013-06-02 MED ORDER — ONDANSETRON HCL 4 MG/2ML IJ SOLN
4.0000 mg | Freq: Four times a day (QID) | INTRAMUSCULAR | Status: DC | PRN
  Filled 2013-06-02 (×5): qty 2

## 2013-06-02 MED ORDER — EPHEDRINE SULFATE 50 MG/ML IJ SOLN
INTRAMUSCULAR | Status: DC | PRN
  Administered 2013-06-02 (×2): 5 mg via INTRAVENOUS

## 2013-06-02 MED ORDER — LACTATED RINGERS IV SOLN
INTRAVENOUS | Status: DC | PRN
  Administered 2013-06-02 (×2): via INTRAVENOUS

## 2013-06-02 MED ORDER — DEXTROSE-NACL 5-0.9 % IV SOLN
INTRAVENOUS | Status: DC
  Administered 2013-06-03: 14:00:00 via INTRAVENOUS
  Administered 2013-06-04: 50 mL/h via INTRAVENOUS

## 2013-06-02 MED ORDER — HYDROMORPHONE HCL PF 1 MG/ML IJ SOLN: 0.2500 mg | INTRAMUSCULAR | Status: DC | PRN

## 2013-06-02 MED ORDER — OXYCODONE HCL 5 MG PO TABS: 5.0000 mg | ORAL_TABLET | Freq: Once | ORAL | Status: DC | PRN

## 2013-06-02 MED ORDER — LACTATED RINGERS IV SOLN
INTRAVENOUS | Status: DC | PRN
  Administered 2013-06-02: 07:00:00 via INTRAVENOUS

## 2013-06-02 MED ORDER — LIDOCAINE HCL (CARDIAC) 20 MG/ML IV SOLN
INTRAVENOUS | Status: DC | PRN
  Administered 2013-06-02: 60 mg via INTRAVENOUS

## 2013-06-02 MED ORDER — SODIUM CHLORIDE 0.9 % IV SOLN
INTRAVENOUS | Status: DC
  Administered 2013-06-02: 2.5 [IU]/h via INTRAVENOUS
  Filled 2013-06-02 (×2): qty 1

## 2013-06-02 MED ORDER — ALBUTEROL SULFATE HFA 108 (90 BASE) MCG/ACT IN AERS
2.0000 | INHALATION_SPRAY | Freq: Four times a day (QID) | RESPIRATORY_TRACT | Status: DC | PRN
  Filled 2013-06-02: qty 6.7

## 2013-06-02 MED ORDER — HYDROMORPHONE HCL PF 1 MG/ML IJ SOLN
INTRAMUSCULAR | Status: DC | PRN
  Administered 2013-06-02 (×2): 0.5 mg via INTRAVENOUS

## 2013-06-02 MED ORDER — NALOXONE HCL 0.4 MG/ML IJ SOLN: 0.4000 mg | INTRAMUSCULAR | Status: DC | PRN

## 2013-06-02 MED ORDER — PHENYLEPHRINE HCL 10 MG/ML IJ SOLN
10.0000 mg | INTRAVENOUS | Status: DC | PRN
  Administered 2013-06-02: 25 ug/min via INTRAVENOUS

## 2013-06-02 MED ORDER — ROCURONIUM BROMIDE 100 MG/10ML IV SOLN
INTRAVENOUS | Status: DC | PRN
  Administered 2013-06-02: 50 mg via INTRAVENOUS

## 2013-06-02 MED ORDER — PHENYLEPHRINE HCL 10 MG/ML IJ SOLN
INTRAMUSCULAR | Status: DC | PRN
  Administered 2013-06-02 (×2): 40 ug via INTRAVENOUS

## 2013-06-02 MED ORDER — BUPIVACAINE 0.5 % ON-Q PUMP SINGLE CATH 400 ML
INJECTION | Status: DC | PRN
  Administered 2013-06-02: 400 mL

## 2013-06-02 MED ORDER — INSULIN ASPART 100 UNIT/ML ~~LOC~~ SOLN
0.0000 [IU] | Freq: Three times a day (TID) | SUBCUTANEOUS | Status: DC
  Administered 2013-06-02: 8 [IU] via SUBCUTANEOUS

## 2013-06-02 MED ORDER — ACETAMINOPHEN 500 MG PO TABS
1000.0000 mg | ORAL_TABLET | Freq: Four times a day (QID) | ORAL | Status: AC
  Filled 2013-06-02 (×3): qty 2

## 2013-06-02 MED ORDER — PROPOFOL 10 MG/ML IV BOLUS
INTRAVENOUS | Status: DC | PRN
  Administered 2013-06-02: 20 mg via INTRAVENOUS
  Administered 2013-06-02: 200 mg via INTRAVENOUS

## 2013-06-02 MED ORDER — LABETALOL HCL 5 MG/ML IV SOLN
INTRAVENOUS | Status: DC | PRN
  Administered 2013-06-02 (×3): 5 mg via INTRAVENOUS

## 2013-06-02 MED ORDER — POTASSIUM CHLORIDE 10 MEQ/50ML IV SOLN
10.0000 meq | Freq: Every day | INTRAVENOUS | Status: DC | PRN
  Administered 2013-06-06: 10 meq via INTRAVENOUS
  Filled 2013-06-02 (×4): qty 50

## 2013-06-02 MED ORDER — ALBUMIN HUMAN 5 % IV SOLN
INTRAVENOUS | Status: DC | PRN
  Administered 2013-06-02 (×2): via INTRAVENOUS

## 2013-06-02 MED ORDER — TAMSULOSIN HCL 0.4 MG PO CAPS
0.4000 mg | ORAL_CAPSULE | Freq: Every day | ORAL | Status: DC
  Administered 2013-06-03 – 2013-06-07 (×5): 0.4 mg via ORAL
  Filled 2013-06-02 (×7): qty 1

## 2013-06-02 MED ORDER — INSULIN ASPART 100 UNIT/ML ~~LOC~~ SOLN: 0.0000 [IU] | Freq: Every day | SUBCUTANEOUS | Status: DC

## 2013-06-02 MED ORDER — ONDANSETRON HCL 4 MG/2ML IJ SOLN: 4.0000 mg | Freq: Once | INTRAMUSCULAR | Status: DC | PRN

## 2013-06-02 MED ORDER — FENTANYL CITRATE 0.05 MG/ML IJ SOLN
INTRAMUSCULAR | Status: DC | PRN
  Administered 2013-06-02: 100 ug via INTRAVENOUS
  Administered 2013-06-02 (×11): 50 ug via INTRAVENOUS
  Administered 2013-06-02: 100 ug via INTRAVENOUS

## 2013-06-02 MED ORDER — DEXTROSE 5 % IV SOLN
1.5000 g | Freq: Two times a day (BID) | INTRAVENOUS | Status: AC
  Administered 2013-06-02 – 2013-06-03 (×2): 1.5 g via INTRAVENOUS
  Filled 2013-06-02 (×2): qty 1.5

## 2013-06-02 MED ORDER — VECURONIUM BROMIDE 10 MG IV SOLR
INTRAVENOUS | Status: DC | PRN
  Administered 2013-06-02 (×2): 1 mg via INTRAVENOUS
  Administered 2013-06-02: 2 mg via INTRAVENOUS
  Administered 2013-06-02 (×4): 1 mg via INTRAVENOUS
  Administered 2013-06-02: 3 mg via INTRAVENOUS

## 2013-06-02 MED ORDER — OXYCODONE HCL 5 MG/5ML PO SOLN: 5.0000 mg | Freq: Once | ORAL | Status: DC | PRN

## 2013-06-02 SURGICAL SUPPLY — 91 items
APPLICATOR TIP COSEAL (VASCULAR PRODUCTS) IMPLANT
APPLICATOR TIP EXT COSEAL (VASCULAR PRODUCTS) IMPLANT
BLADE SURG 11 STRL SS (BLADE) ×3 IMPLANT
BRUSH CYTOL CELLEBRITY 1.5X140 (MISCELLANEOUS) IMPLANT
CANISTER SUCTION 2500CC (MISCELLANEOUS) ×3 IMPLANT
CATH KIT ON Q 10IN SLV (PAIN MANAGEMENT) ×3 IMPLANT
CATH KIT ON Q 5IN SLV (PAIN MANAGEMENT) IMPLANT
CATH THORACIC 28FR (CATHETERS) ×6 IMPLANT
CATH THORACIC 36FR (CATHETERS) IMPLANT
CATH THORACIC 36FR RT ANG (CATHETERS) IMPLANT
CLIP TI MEDIUM 6 (CLIP) ×3 IMPLANT
CONN ST 1/4X3/8  BEN (MISCELLANEOUS)
CONN ST 1/4X3/8 BEN (MISCELLANEOUS) IMPLANT
CONN Y 3/8X3/8X3/8  BEN (MISCELLANEOUS)
CONN Y 3/8X3/8X3/8 BEN (MISCELLANEOUS) IMPLANT
CONT SPEC 4OZ CLIKSEAL STRL BL (MISCELLANEOUS) ×6 IMPLANT
COVER SURGICAL LIGHT HANDLE (MISCELLANEOUS) ×3 IMPLANT
COVER TABLE BACK 60X90 (DRAPES) ×3 IMPLANT
DERMABOND ADVANCED (GAUZE/BANDAGES/DRESSINGS) ×1
DERMABOND ADVANCED .7 DNX12 (GAUZE/BANDAGES/DRESSINGS) ×2 IMPLANT
DRAPE LAPAROSCOPIC ABDOMINAL (DRAPES) ×3 IMPLANT
DRAPE WARM FLUID 44X44 (DRAPE) ×3 IMPLANT
DRILL BIT 7/64X5 (BIT) ×3 IMPLANT
DRSG AQUACEL AG ADV 3.5X10 (GAUZE/BANDAGES/DRESSINGS) ×3 IMPLANT
DRSG AQUACEL AG ADV 3.5X14 (GAUZE/BANDAGES/DRESSINGS) IMPLANT
ELECT BLADE 4.0 EZ CLEAN MEGAD (MISCELLANEOUS) ×6
ELECT REM PT RETURN 9FT ADLT (ELECTROSURGICAL) ×3
ELECTRODE BLDE 4.0 EZ CLN MEGD (MISCELLANEOUS) ×4 IMPLANT
ELECTRODE REM PT RTRN 9FT ADLT (ELECTROSURGICAL) ×2 IMPLANT
FORCEPS BIOP RJ4 1.8 (CUTTING FORCEPS) IMPLANT
GLOVE BIO SURGEON STRL SZ 6 (GLOVE) ×9 IMPLANT
GLOVE BIO SURGEON STRL SZ 6.5 (GLOVE) ×15 IMPLANT
GLOVE BIO SURGEON STRL SZ7.5 (GLOVE) ×3 IMPLANT
GLOVE BIOGEL PI IND STRL 6 (GLOVE) ×6 IMPLANT
GLOVE BIOGEL PI INDICATOR 6 (GLOVE) ×3
GOWN STRL NON-REIN LRG LVL3 (GOWN DISPOSABLE) ×27 IMPLANT
HANDLE STAPLE ENDO GIA SHORT (STAPLE) ×1
KIT BASIN OR (CUSTOM PROCEDURE TRAY) ×3 IMPLANT
KIT ROOM TURNOVER OR (KITS) ×3 IMPLANT
KIT SUCTION CATH 14FR (SUCTIONS) ×3 IMPLANT
MARKER SKIN DUAL TIP RULER LAB (MISCELLANEOUS) ×3 IMPLANT
NEEDLE BIOPSY TRANSBRONCH 21G (NEEDLE) IMPLANT
NS IRRIG 1000ML POUR BTL (IV SOLUTION) ×6 IMPLANT
OIL SILICONE PENTAX (PARTS (SERVICE/REPAIRS)) ×3 IMPLANT
PACK CHEST (CUSTOM PROCEDURE TRAY) ×3 IMPLANT
PAD ARMBOARD 7.5X6 YLW CONV (MISCELLANEOUS) ×9 IMPLANT
PASSER SUT SWANSON 36MM LOOP (INSTRUMENTS) ×3 IMPLANT
RELOAD EGIA 45 TAN VASC (STAPLE) ×18 IMPLANT
RELOAD EGIA 60 MED/THCK PURPLE (STAPLE) ×6 IMPLANT
RELOAD EGIA BLACK ROTIC 45MM (STAPLE) ×3 IMPLANT
RELOAD EGIA TRIS TAN 45 CVD (STAPLE) IMPLANT
SCISSORS LAP 5X35 DISP (ENDOMECHANICALS) IMPLANT
SEALANT PROGEL (MISCELLANEOUS) IMPLANT
SEALANT SURG COSEAL 4ML (VASCULAR PRODUCTS) IMPLANT
SEALANT SURG COSEAL 8ML (VASCULAR PRODUCTS) IMPLANT
SOLUTION ANTI FOG 6CC (MISCELLANEOUS) ×6 IMPLANT
SPONGE GAUZE 4X4 12PLY (GAUZE/BANDAGES/DRESSINGS) ×3 IMPLANT
SPONGE INTESTINAL PEANUT (DISPOSABLE) ×3 IMPLANT
SPONGE NEURO XRAY DETECT 1X3 (DISPOSABLE) ×3 IMPLANT
STAPLER ENDO GIA 12MM SHORT (STAPLE) ×2 IMPLANT
SUT PROLENE 3 0 SH DA (SUTURE) IMPLANT
SUT PROLENE 4 0 RB 1 (SUTURE) ×1
SUT PROLENE 4-0 RB1 .5 CRCL 36 (SUTURE) ×2 IMPLANT
SUT SILK  1 MH (SUTURE) ×4
SUT SILK 1 MH (SUTURE) ×8 IMPLANT
SUT SILK 2 0 SH (SUTURE) IMPLANT
SUT SILK 2 0SH CR/8 30 (SUTURE) IMPLANT
SUT SILK 3 0SH CR/8 30 (SUTURE) ×3 IMPLANT
SUT STEEL 1 (SUTURE) IMPLANT
SUT VIC AB 1 CTX 18 (SUTURE) ×3 IMPLANT
SUT VIC AB 1 CTX 36 (SUTURE)
SUT VIC AB 1 CTX36XBRD ANBCTR (SUTURE) IMPLANT
SUT VIC AB 2-0 CTX 36 (SUTURE) ×3 IMPLANT
SUT VIC AB 2-0 UR6 27 (SUTURE) IMPLANT
SUT VIC AB 3-0 SH 8-18 (SUTURE) IMPLANT
SUT VIC AB 3-0 X1 27 (SUTURE) ×9 IMPLANT
SUT VICRYL 2 TP 1 (SUTURE) ×3 IMPLANT
SWAB COLLECTION DEVICE MRSA (MISCELLANEOUS) IMPLANT
SYR 20ML ECCENTRIC (SYRINGE) ×3 IMPLANT
SYSTEM SAHARA CHEST DRAIN ATS (WOUND CARE) ×3 IMPLANT
TAPE CLOTH SURG 4X10 WHT LF (GAUZE/BANDAGES/DRESSINGS) ×3 IMPLANT
TAPE UMBILICAL COTTON 1/8X30 (MISCELLANEOUS) ×3 IMPLANT
TIP APPLICATOR SPRAY EXTEND 16 (VASCULAR PRODUCTS) IMPLANT
TOWEL OR 17X24 6PK STRL BLUE (TOWEL DISPOSABLE) ×6 IMPLANT
TOWEL OR 17X26 10 PK STRL BLUE (TOWEL DISPOSABLE) ×6 IMPLANT
TRAP SPECIMEN MUCOUS 40CC (MISCELLANEOUS) ×3 IMPLANT
TRAY FOLEY CATH 14FRSI W/METER (CATHETERS) ×3 IMPLANT
TUBE ANAEROBIC SPECIMEN COL (MISCELLANEOUS) IMPLANT
TUBE CONNECTING 12X1/4 (SUCTIONS) ×6 IMPLANT
TUNNELER SHEATH ON-Q 11GX8 DSP (PAIN MANAGEMENT) ×6 IMPLANT
WATER STERILE IRR 1000ML POUR (IV SOLUTION) ×6 IMPLANT

## 2013-06-02 NOTE — Anesthesia Postprocedure Evaluation (Signed)
Anesthesia Post Note  Patient: Mark Martin  Procedure(s) Performed: Procedure(s) (LRB): VIDEO BRONCHOSCOPY (N/A) VIDEO ASSISTED THORACOSCOPY (VATS)/WEDGE RESECTION (Left) THORACOTOMY/LOBECTOMY (Left)  Anesthesia type: general  Patient location: PACU  Post pain: Pain level controlled  Post assessment: Patient's Cardiovascular Status Stable  Last Vitals:  Filed Vitals:   06/02/13 1440  BP:   Pulse: 98  Temp:   Resp: 33    Post vital signs: Reviewed and stable  Level of consciousness: sedated  Complications: No apparent anesthesia complications

## 2013-06-02 NOTE — Brief Op Note (Addendum)
      [address]       [name] [address]             [phone]      06/02/2013  12:59 PM  PATIENT:  Mark Martin  57 y.o. male  PRE-OPERATIVE DIAGNOSIS:  Left Upper Lobe Lung Ca  POST-OPERATIVE DIAGNOSIS: Same  PROCEDURE:  Procedure(s): VIDEO BRONCHOSCOPY LEFT MINI-THORACOTOMY, LUL LOBECTOMY, LN dissection  SURGEON:  Surgeon(s): Delight Ovens, MD  PHYSICIAN ASSISTANT: WAYNE GOLD PA-C  ANESTHESIA:   general  SPECIMEN:  Source of Specimen:  LUL, LN SAMPLES   DISPOSITION OF SPECIMEN:  Pathology  DRAINS: 2 Chest Tube(s) in the LEFT HEMITHORAX   PATIENT CONDITION:  PACU - hemodynamically stable.  PRE-OPERATIVE WEIGHT: 92kg  FROZEN- NEG MARGINS  COMPLICATIONS: NONE  KNOWN

## 2013-06-02 NOTE — H&P (Signed)
[address]       [place] [address]             [phone]                      Jaelon Gatley [facility] Medical Record #[id number] Date of Birth: 12-Apr-1956  Referring:Chodri, Tanvir Primary Care: Charlott Rakes, MD  Chief Complaint:    Left Lung Mass   History of Present Illness:    Patient is a 57 year old nonsmoker who presents with a left lung mass found on chest x-ray and followed up with CT and PET. The patient was treated for a "pneumonic" process in December 2013 with antibiotics. In June he noted some increase in wheezing and cough and chest x-ray showed persistence of a left lung mass. Subsequent CT of the chest and PET scan was performed. Patient is referred for further evaluation of left lung mass. He denies any hemoptysis. He does have some shortness of breath with exertion but is without symptoms with usual activities. He had  coronary artery bypass grafting in 2002 and has had no cardiac events since that time. He saw Dr Tomie China last week in routine follow up.He has long-standing  Insulin dependent diabetes.  The patient notes that he is a lifelong NONsmoker, he denies any history of tuberculosis, he denies any work related to asbestos. He did work in [employer] with dust.   The patient returns today to review the results of needle bx done on left lung lesion last week and discuss treatment options  Current Activity/ Functional Status:  Patient is independent with mobility/ambulation, transfers, ADL's, IADL's.  Zubrod Score: At the time of surgery this patient's most appropriate activity status/level should be described as: []  Normal activity, no symptoms [x]  Symptoms, fully ambulatory []  Symptoms, in bed less than or equal to 50% of the time []  Symptoms, in bed greater than 50% of the time but less than 100% []  Bedridden []  Moribund   Past Medical History  Diagnosis Date  . Hyperlipidemia   . Hypertension   . BPH  (benign prostatic hypertrophy)   . Mass of lung     RIGHT UPPER LOBE  . Diabetes mellitus without complication     TYPE 2  . 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 Glaucoma   . Shortness of breath     WITH EXERTION  . Arthritis   . Ankle injury     RIGHT  . H/O: knee surgery     2 PINS IN RIGHT KNEE AND IN RIGHT ANKLE    Past Surgical History  Procedure Laterality Date  . Coronary artery bypass graft      CABG X 5 08/14/2001  . Appendectomy    . Eye surgery Left     CATARACT    Family History: Patient's father is still alive at age 55 with mild hypertension, his mother died at age 70 of diabetes related complications. He has 3 children 2517 and 15 were all healthy.  History   Social History  . Marital Status: Married    Spouse Name:     Number of Children: 3  . Years of Education: N/A   Occupational History  .  currently on disability for diabetes and cardiac disease  Social History Main Topics  . Smoking status: Never Smoker   . Smokeless tobacco: Not on file  . Alcohol Use: No  . Drug Use: no  .      No Known Allergies  Current Outpatient Prescriptions  Medication Sig Dispense Refill  . albuterol (PROVENTIL HFA;VENTOLIN HFA) 108 (90 BASE) MCG/ACT inhaler Inhale 2 puffs into the lungs every 6 (six) hours as needed for wheezing.      Marland Kitchen aspirin EC 325 MG tablet Take 325 mg by mouth daily.      Marland Kitchen atorvastatin (LIPITOR) 40 MG tablet Take 40 mg by mouth daily.      . hydrochlorothiazide (HYDRODIURIL) 25 MG tablet Take 25 mg by mouth daily.      . insulin NPH-regular (NOVOLIN 70/30) (70-30) 100 UNIT/ML injection Inject 55 Units into the skin daily with breakfast. 44U NOON AND 55U QHS      . metFORMIN (GLUMETZA) 500 MG (MOD) 24 hr tablet Take 1,000 mg by mouth 2 (two) times daily with a meal.      . metoprolol (LOPRESSOR) 50 MG tablet Take 50 mg by mouth 2  (two) times daily.      . ramipril (ALTACE) 10 MG capsule Take 10 mg by mouth 2 (two) times daily.      . tamsulosin (FLOMAX) 0.4 MG CAPS capsule Take by mouth.          Review of Systems:     Cardiac Review of Systems: Y or N  Chest Pain [  n  ]  Resting SOB [n   ] Exertional SOB  [ y ]  Orthopnea [ n ]   Pedal Edema [  Mild rt > left ]    Palpitations [n  ] Syncope  [ n ]   Presyncope [ n  ]  General Review of Systems: [Y] = yes [  ]=no Constitional: recent weight change [ n ]; anorexia [ n ]; fatigue [n  ]; nausea [ n ]; night sweats [  n]; fever [n  ]; or chills [ n ];                                                                                                                                          Dental: poor dentition[n  ]; Last Dentist visit:   Eye : blurred vision [ n ]; diplopia [   ]; vision changes [  ];  Amaurosis fugax[ n ]; Resp: cough [ y ];  wheezing[rare ];  hemoptysis[ n ]; shortness of breath[ y ]; paroxysmal nocturnal dyspnea[ n ]; dyspnea on exertion[ y ]; or orthopnea[ n ];  GI:  gallstones[ n ], vomiting[n  ];  dysphagia[ n ]; melena[ n;  hematochezia [  ]; heartburn[  ];   Hx of  Colonoscopy[ no ]; GU: kidney stones [ n ]; hematuria[n  ];   dysuria Cove.Etienne  ];  nocturia[  ];  history of     obstruction [ n ]; urinary frequency Cove.Etienne  ]             Skin: rash, swelling[  ];, hair loss[  ];  peripheral edema[  ];  or itching[  ]; Musculosketetal: myalgias[  ];  joint swelling[  ];  joint erythema[  ];  joint pain[  ];  back pain[  ];  Heme/Lymph: bruising[  ];  bleeding[  ];  anemia[  ];  Neuro: TIA[  ];  headaches[  ];  stroke[  ];  vertigo[  ];  seizures[  ];   paresthesias[  ];  difficulty walking[  ];  Psych:depression[  ]; anxiety[  ];  Endocrine: diabetes[ y ];  thyroid dysfunction[ n ];  Immunizations: Flu Cove.Etienne  ]; Pneumococcal[ y ];  Other:  Physical Exam: BP 185/96  Pulse 65  Temp(Src) 97.2 F (36.2 C) (Oral)  Resp 18  SpO2 100%  General appearance:  alert, cooperative, appears older than stated age and no distress Neurologic: intact Heart: regular rate and rhythm, S1, S2 normal, no murmur, click, rub or gallop Lungs: clear to auscultation bilaterally and normal percussion bilaterally Abdomen: soft, non-tender; bowel sounds normal; no masses,  no organomegaly Extremities: extremities normal, atraumatic, no cyanosis or edema and Homans sign is negative, no sign of DVT Patient has no cervical or supraclavicular adenopathy, as probable DP and PT pulses, or scars on the medial aspect of his right ankle and knee from previous trauma and pinning  Diagnostic Studies & Laboratory data:     Recent Radiology Findings: Dg Chest 2 View  05/29/2013   CLINICAL DATA:  Preop lung mass  EXAM: CHEST  2 VIEW  COMPARISON:  05/14/2013  FINDINGS: Left upper lobe mass lesion unchanged. Negative for effusion.  Cardiac enlargement without heart failure. Prior CABG. Right lung is clear.  IMPRESSION: Left upper lobe mass lesion unchanged. No superimposed acute abnormality.   Electronically Signed   By: Marlan Palau M.D.   On: 05/29/2013 14:49    05/14/2013   *RADIOLOGY REPORT*  CT GUIDED LUNG BIOPSY  Date: 05/14/2013  Clinical History: 57 year old male with a persistent ground-glass attenuation mass-like consolidation in the left upper lobe concerning for primary lung adenocarcinoma.  CT guided biopsy is requested to facilitate tissue diagnosis.  Procedures Performed: 1. CT guided biopsy of left upper lobe mass  Interventional Radiologist:  Sterling Big, MD  Sedation: Moderate (conscious) sedation was used.  Six mg Versed, 200 mcg Fentanyl were administered intravenously.  The patient's vital signs were monitored continuously by radiology nursing throughout the procedure.  Sedation Time: 25 minutes  Fluoroscopy time: None  Contrast volume: None  PROCEDURE/FINDINGS:   Informed consent was obtained from the patient following explanation of the procedure, risks,  benefits and alternatives. The patient understands, agrees and consents for the procedure. All questions were addressed. A time out was performed.  Maximal barrier sterile technique utilized including caps, mask, sterile gowns, sterile gloves, large sterile drape, hand hygiene, and betadine skin prep.  A planning axial CT scan was performed.  The ground-glass attenuation mass with internal air bronchograms in the periphery of the left upper lobe was successfully identified.  A suitable skin entry site was selected and marked.  Local anesthesia was attained by infiltration with 1% lidocaine.  A small dermatotomy was made with a #11 blade.  Using CT fluoroscopic guidance, a 15 cm 17 gauge trocar needle was carefully advanced over a  rib and into the periphery of the mass.  Several 18-gauge core biopsies were then coaxially obtained using the Biopince automated biopsy device.  The needle was removed.  Post biopsy axial CT imaging demonstrates trace alveolar hemorrhage posterior to the biopsy signed and a tiny peripheral and anterior pneumothorax.  The patient tolerated the procedure well.  IMPRESSION:  1.  Successful CT-guided biopsy of left upper lobe mass-like consolidation.  2.  Tiny postprocedural pneumothorax noted.  This will be followed closely with one and two hour post biopsy chest x-rays.  Signed,  Sterling Big, MD Vascular & Interventional Radiologist [facility]   Original Report Authenticated By: Malachy Moan, M.D.     03/02/2013 CT CHEST WITHOUT CONTRAST  Technique: Multidetector CT imaging of the chest was performed following the standard protocol without IV contrast.  Comparison: Chest radiograph 02/23/2013.  Findings: No pathologically enlarged mediastinal or axillary lymph nodes. Hilar regions are difficult to definitively evaluate without IV contrast. Heart size normal. No pericardial effusion. Incidental note is made of mild bilateral gynecomastia.  Mass-like  airspace consolidation in the lingula measures 2.6 x 3.3 cm and has a tag to the adjacent pleura. Finding corresponds to the questioned abnormality on recent chest radiograph. Minimal surrounding inflammatory change. Additional scattered pulmonary nodules measure up to 6 mm in the left lower lobe. Probable calcified granuloma in the right lower lobe. No pleural fluid. Airway is unremarkable.  Incidental imaging of the upper abdomen shows no acute findings. No worrisome lytic or sclerotic lesions. Degenerative changes are seen in the spine with prominent osteophytes.  IMPRESSION:  1. Mass-like consolidation in the lingula has an appearance indicative of primary bronchogenic carcinoma. Pneumonia cannot be excluded. Follow-up could be performed in 4-6 weeks, if there are infectious symptoms and after antibiotic therapy, as clinically indicated. A more aggressive approach would include consultation with pulmonary medicine or thoracic surgery. These results will be called to the ordering clinician or representative by the Radiologist Assistant, and communication documented in the PACS Dashboard. 2. Scattered pulmonary nodules measure up to 6 mm. Continued attention on follow-up exams is warranted. Original Report Authenticated By: Leanna Battles, M.D.   03/18/2013 NUCLEAR MEDICINE PET SKULL BASE TO THIGH Fasting Blood Glucose: 210 Technique: 11.58 mCi F-18 FDG was injected intravenously. CT data was obtained and used for attenuation correction and anatomic localization only. (This was not acquired as a diagnostic CT examination.) Additional exam technical data entered on technologist worksheet.  Comparison: Chest CT 03/02/2013.  Findings:  Neck: No hypermetabolic lymph nodes in the neck.  Chest: Again noted is a 3.3 x 2.6 cm mass in the left upper lobe which has some internal air bronchograms. The periphery of this lesion has slightly spiculated margins with mild retraction of  the adjacent pleural laterally. This lesion demonstrates only low- level metabolic activity (SUVmax = 2.1). No hypermetabolic mediastinal or hilar nodes. There is atherosclerosis of the thoracic aorta, the great vessels of the mediastinum and the coronary arteries, including calcified atherosclerotic plaque in the left main, left anterior descending, left circumflex and right coronary arteries. Status post median sternotomy for CABG. No pleural effusions.  Abdomen/Pelvis: No abnormal hypermetabolic activity within the liver, pancreas, adrenal glands, or spleen. No hypermetabolic lymph nodes in the abdomen or pelvis. Small calcified gallstone in the neck of the gallbladder. No current findings to suggest acute cholecystitis at this time. Extensive atherosclerosis throughout the abdominal and pelvic vasculature, without definite aneurysm. There are a few scattered colonic diverticula, without surrounding inflammatory changes  to suggest acute diverticulitis at this time. No significant volume of ascites. No pneumoperitoneum. No pathologic distension of small bowel. Prostate gland and urinary bladder are unremarkable in appearance.  Skeleton: No focal hypermetabolic activity to suggest skeletal metastasis.  IMPRESSION: 1. 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 Low level metabolic activity within this lesion would be typical for such an entity, and given the persistence on chest radiographs compared to prior study from 08/26/2012, this is highly suspicious. Surgical consultation is strongly recommended. No evidence of nodal metastases in the hila or mediastinal stations. No evidence of distal metastatic disease at this time. Accordingly, this appears to represent T2a, N0, M0 disease (i.e., stage IB). 2. Atherosclerosis, including left main and three-vessel coronary artery disease. Status post median sternotomy for  CABG. 3. Cholelithiasis without evidence to suggest acute cholecystitis at this time. 4. Mild colonic diverticulosis without evidence to suggest acute diverticulitis at this time. Original Report Authenticated By: Trudie Reed, M.D.   08/26/2012 Clinical Data: Wheezing. Cough and congestion.  CHEST - 2 VIEW Comparison: 08/24/2004 Findings: Mild cardiac enlargement. Previous median sternotomy and CABG procedure. Opacity within the left upper lobe is identified and appears new from previous exam. This may represent early or resolving pneumonia. No effusions or edema identified.  IMPRESSION: 1. Left upper lobe opacity which may represent pneumonia. Radiographic followup is recommended to ensure resolution. Original Report Authenticated By: Signa Kell, M.D.  PFT's screening 03/04/2013   FEV1 2.27   72% no diffusion capacity  Full PFT's 9/14 : FEV1 2.15 62%  DLCO 25.7   86%   Recent Lab Findings: Lab Results  Component Value Date   WBC 10.1 05/29/2013   HGB 14.5 05/29/2013   HCT 41.4 05/29/2013   PLT 230 05/29/2013   GLUCOSE 213* 05/29/2013   ALT 21 05/29/2013   AST 20 05/29/2013   NA 132* 05/29/2013   K 3.8 05/29/2013   CL 96 05/29/2013   CREATININE 0.89 05/29/2013   BUN 16 05/29/2013   CO2 21 05/29/2013   INR 1.04 05/29/2013    Path: [facility] : [id number]  Diagnosis Lung, needle/core biopsy(ies), Left upper lobe pulmonary nodule - POSITIVE FOR ADENOCARCINOMA   Assessment / Plan:   #1 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 #2 history of previous coronary artery bypass grafting #3 diabetes mellitus  I discussed with the patient the possible diagnoses of the left lung cancer  and treatment options. I recommended to the patient that we proceed with Bronchoscopy and left VATS and lung resection/probable left upper lobectomy y. Left upper lobectomy may be complicated by his previous bypass surgery and use of left  internal mammary artery. The risks and expectations of surgical resection has been discussed with the patient, and he is will to proceed with surgical resection. He wished to wait until after Eid al-Adha. We will plan to proceed with surgery today.  The goals risks and alternatives of the planned surgical procedure bronch, left VATS and lung resection have been discussed with the patient in detail. The risks of the procedure including death, infection, stroke, myocardial infarction, bleeding, blood transfusion have all been discussed specifically.  I have quoted Arnetha Gula a 2 % of perioperative mortality and a complication rate as high as 20 %. The patient's questions have been answered.Huy Majid is willing  to proceed with the planned procedure.  Delight Ovens MD  [phone]  [address] [place] [address] Office [phone]   Beeper [phone]   06/02/2013 6:58 AM

## 2013-06-02 NOTE — OR Nursing (Signed)
Procedure 1 start at 0740 end at [phone]

## 2013-06-02 NOTE — Progress Notes (Signed)
Checked HS CBG. Result 313. Dr. Laneta Simmers notified. Order for insulin drip received. Thresa Ross RN

## 2013-06-02 NOTE — Transfer of Care (Signed)
Immediate Anesthesia Transfer of Care Note  Patient: Mark Martin  Procedure(s) Performed: Procedure(s): VIDEO BRONCHOSCOPY (N/A) VIDEO ASSISTED THORACOSCOPY (VATS)/WEDGE RESECTION (Left) THORACOTOMY/LOBECTOMY (Left)  Patient Location: PACU  Anesthesia Type:General  Level of Consciousness: awake, alert  and oriented  Airway & Oxygen Therapy: Patient Spontanous Breathing and Patient connected to face mask oxygen  Post-op Assessment: Report given to PACU RN, Post -op Vital signs reviewed and stable and Patient moving all extremities  Post vital signs: Reviewed and stable  Complications: No apparent anesthesia complications

## 2013-06-02 NOTE — Anesthesia Procedure Notes (Addendum)
Procedure Name: Intubation Date/Time: 06/02/2013 7:32 AM Performed by: Luster Landsberg Pre-anesthesia Checklist: Patient identified, Emergency Drugs available, Suction available and Patient being monitored Patient Re-evaluated:Patient Re-evaluated prior to inductionOxygen Delivery Method: Circle system utilized Preoxygenation: Pre-oxygenation with 100% oxygen Intubation Type: IV induction Ventilation: Mask ventilation without difficulty and Oral airway inserted - appropriate to patient size Laryngoscope Size: Mac and 3 Grade View: Grade I Tube type: Oral Tube size: 8.5 mm Number of attempts: 1 Airway Equipment and Method: Stylet Placement Confirmation: ETT inserted through vocal cords under direct vision,  positive ETCO2 and breath sounds checked- equal and bilateral Secured at: 22 cm Tube secured with: Tape Dental Injury: Teeth and Oropharynx as per pre-operative assessment    Procedure Name: Intubation Date/Time: 06/02/2013 7:45 AM Performed by: Elizbeth Squires R Pre-anesthesia Checklist: Patient identified, Emergency Drugs available, Suction available and Patient being monitored Patient Re-evaluated:Patient Re-evaluated prior to inductionOxygen Delivery Method: Circle system utilized Preoxygenation: Pre-oxygenation with 100% oxygen Laryngoscope Size: Mac and 3 Grade View: Grade I Endobronchial tube: Double lumen EBT and 39 Fr Number of attempts: 1 Airway Equipment and Method: Stylet and Fiberoptic brochoscope Placement Confirmation: ETT inserted through vocal cords under direct vision,  positive ETCO2 and breath sounds checked- equal and bilateral Tube secured with: Tape Dental Injury: Teeth and Oropharynx as per pre-operative assessment  Comments: 8.5 ETT exchanged for DLT 56F for second procedure

## 2013-06-02 NOTE — Anesthesia Preprocedure Evaluation (Addendum)
Anesthesia Evaluation  Patient identified by MRN, date of birth, ID band Patient awake    Reviewed: Allergy & Precautions, H&P , NPO status , Patient's Chart, lab work & pertinent test results  Airway Mallampati: I TM Distance: >3 FB Neck ROM: Full    Dental  (+) Missing, Dental Advisory Given and Teeth Intact   Pulmonary  RUL lung mass breath sounds clear to auscultation        Cardiovascular hypertension, Pt. on medications and Pt. on home beta blockers + CAD and + CABG Rhythm:Regular Rate:Normal     Neuro/Psych    GI/Hepatic   Endo/Other  diabetes, Type 2obese  Renal/GU      Musculoskeletal  (+) Arthritis -,   Abdominal   Peds  Hematology   Anesthesia Other Findings   Reproductive/Obstetrics                          Anesthesia Physical Anesthesia Plan  ASA: III  Anesthesia Plan: General   Post-op Pain Management:    Induction: Intravenous  Airway Management Planned: Double Lumen EBT  Additional Equipment: Arterial line and CVP  Intra-op Plan:   Post-operative Plan: Extubation in OR  Informed Consent: I have reviewed the patients History and Physical, chart, labs and discussed the procedure including the risks, benefits and alternatives for the proposed anesthesia with the patient or authorized representative who has indicated his/her understanding and acceptance.   Dental advisory given  Plan Discussed with: CRNA, Anesthesiologist and Surgeon  Anesthesia Plan Comments:         Anesthesia Quick Evaluation

## 2013-06-02 NOTE — Preoperative (Signed)
Beta Blockers   Reason not to administer Beta Blockers:Not Applicable 

## 2013-06-02 NOTE — Progress Notes (Signed)
Pt transported to 2300 by MA [employer] RN

## 2013-06-03 ENCOUNTER — Inpatient Hospital Stay (HOSPITAL_COMMUNITY): Payer: Medicare Other

## 2013-06-03 DIAGNOSIS — J9819 Other pulmonary collapse: Secondary | ICD-10-CM | POA: Diagnosis not present

## 2013-06-03 LAB — TYPE AND SCREEN
ABO/RH(D): O POS
Antibody Screen: NEGATIVE
Unit division: 0
Unit division: 0
Unit division: 0
Unit division: 0

## 2013-06-03 LAB — BASIC METABOLIC PANEL
Calcium: 8 mg/dL — ABNORMAL LOW (ref 8.4–10.5)
GFR calc Af Amer: >90 mL/min (ref >90)
GFR calc non Af Amer: >90 mL/min (ref >90)
Potassium: 3.7 mEq/L (ref 3.5–5.1)
Sodium: 136 mEq/L (ref 135–145)

## 2013-06-03 LAB — GLUCOSE, CAPILLARY
Glucose-Capillary: 105 mg/dL — ABNORMAL HIGH (ref 70–99)
Glucose-Capillary: 122 mg/dL — ABNORMAL HIGH (ref 70–99)
Glucose-Capillary: 124 mg/dL — ABNORMAL HIGH (ref 70–99)
Glucose-Capillary: 130 mg/dL — ABNORMAL HIGH (ref 70–99)
Glucose-Capillary: 132 mg/dL — ABNORMAL HIGH (ref 70–99)
Glucose-Capillary: 137 mg/dL — ABNORMAL HIGH (ref 70–99)
Glucose-Capillary: 140 mg/dL — ABNORMAL HIGH (ref 70–99)
Glucose-Capillary: 141 mg/dL — ABNORMAL HIGH (ref 70–99)
Glucose-Capillary: 141 mg/dL — ABNORMAL HIGH (ref 70–99)
Glucose-Capillary: 154 mg/dL — ABNORMAL HIGH (ref 70–99)
Glucose-Capillary: 162 mg/dL — ABNORMAL HIGH (ref 70–99)
Glucose-Capillary: 167 mg/dL — ABNORMAL HIGH (ref 70–99)
Glucose-Capillary: 193 mg/dL — ABNORMAL HIGH (ref 70–99)
Glucose-Capillary: 203 mg/dL — ABNORMAL HIGH (ref 70–99)
Glucose-Capillary: 232 mg/dL — ABNORMAL HIGH (ref 70–99)
Glucose-Capillary: 267 mg/dL — ABNORMAL HIGH (ref 70–99)
Glucose-Capillary: 282 mg/dL — ABNORMAL HIGH (ref 70–99)
Glucose-Capillary: 305 mg/dL — ABNORMAL HIGH (ref 70–99)

## 2013-06-03 LAB — CBC
MCHC: 34.2 g/dL (ref 30.0–36.0)
RDW: 14.7 % (ref 11.5–15.5)
WBC: 13.9 10*3/uL — ABNORMAL HIGH (ref 4.0–10.5)

## 2013-06-03 LAB — POCT I-STAT 3, ART BLOOD GAS (G3+)
Bicarbonate: 25.4 mEq/L — ABNORMAL HIGH (ref 20.0–24.0)
O2 Saturation: 96 %
Patient temperature: 98
TCO2: 27 mmol/L (ref 0–100)
pCO2 arterial: 43 mmHg (ref 35.0–45.0)
pH, Arterial: 7.378 (ref 7.350–7.450)
pO2, Arterial: 86 mmHg (ref 80.0–100.0)

## 2013-06-03 MED ORDER — INSULIN DETEMIR 100 UNIT/ML ~~LOC~~ SOLN
50.0000 [IU] | Freq: Every day | SUBCUTANEOUS | Status: DC
  Administered 2013-06-03 – 2013-06-08 (×6): 50 [IU] via SUBCUTANEOUS
  Filled 2013-06-03 (×6): qty 0.5

## 2013-06-03 MED ORDER — BELLADONNA ALKALOIDS-OPIUM 16.2-60 MG RE SUPP
1.0000 | Freq: Once | RECTAL | Status: AC
  Administered 2013-06-03: 1 via RECTAL
  Filled 2013-06-03: qty 1

## 2013-06-03 MED ORDER — POTASSIUM CHLORIDE 10 MEQ/50ML IV SOLN
10.0000 meq | INTRAVENOUS | Status: AC | PRN
  Administered 2013-06-03 (×3): 10 meq via INTRAVENOUS

## 2013-06-03 MED ORDER — INSULIN ASPART 100 UNIT/ML ~~LOC~~ SOLN
0.0000 [IU] | SUBCUTANEOUS | Status: DC
  Administered 2013-06-03 (×2): 2 [IU] via SUBCUTANEOUS
  Administered 2013-06-03 – 2013-06-04 (×3): 4 [IU] via SUBCUTANEOUS
  Administered 2013-06-04 (×2): 2 [IU] via SUBCUTANEOUS
  Administered 2013-06-04 – 2013-06-05 (×2): 4 [IU] via SUBCUTANEOUS
  Administered 2013-06-05: 2 [IU] via SUBCUTANEOUS
  Administered 2013-06-05 – 2013-06-06 (×2): 4 [IU] via SUBCUTANEOUS

## 2013-06-03 MED ORDER — INSULIN ASPART 100 UNIT/ML ~~LOC~~ SOLN
2.0000 [IU] | Freq: Once | SUBCUTANEOUS | Status: AC
  Administered 2013-06-02: 15 [IU] via SUBCUTANEOUS

## 2013-06-03 NOTE — Progress Notes (Signed)
ABG I-stat report will not import; even after machine docked multiple times. Results are: pH: 7.378  PCo2: 43  PO2 86  Be: 0  Bicarb: 25.4  TCo2: 27  sO2 96.   Thresa Ross RN

## 2013-06-03 NOTE — Progress Notes (Addendum)
TCTS DAILY ICU PROGRESS NOTE                   [address]            [place] [address]          [phone]   1 Day Post-Op Procedure(s) (LRB): VIDEO BRONCHOSCOPY (N/A) VIDEO ASSISTED THORACOSCOPY (VATS)/WEDGE RESECTION (Left) THORACOTOMY/LOBECTOMY (Left)  Total Length of Stay:  LOS: 1 day   Subjective: OOB in chair.  Having some nausea this am, just received Zofran.  Pain fairly well controlled with PCA. Breathing stable.   Objective: Vital signs in last 24 hours: Temp:  [97.4 F (36.3 C)-98.3 F (36.8 C)] 97.4 F (36.3 C) (10/08 0754) Pulse Rate:  [75-105] 75 (10/08 0700) Cardiac Rhythm:  [-] Normal sinus rhythm (10/08 0600) Resp:  [20-41] 23 (10/08 0700) BP: (119-153)/(59-83) 128/72 mmHg (10/08 0700) SpO2:  [90 %-100 %] 99 % (10/08 0700) Arterial Line BP: (130-183)/(55-89) 147/55 mmHg (10/08 0700) Weight:  [202 lb 13.2 oz (92 kg)] 202 lb 13.2 oz (92 kg) (10/08 0625)  Filed Weights   06/03/13 0625  Weight: 202 lb 13.2 oz (92 kg)    Weight change:    Hemodynamic parameters for last 24 hours:    Intake/Output from previous day: 10/07 0701 - 10/08 0700 In: 5435.5 [I.V.:4085.5; IV Piggyback:650] Out: 3040 [Urine:2150; Blood:300; Chest Tube:590]  Intake/Output this shift:    Current Meds: Scheduled Meds: . acetaminophen  1,000 mg Oral Q6H   Or  . acetaminophen (TYLENOL) oral liquid 160 mg/5 mL  1,000 mg Oral Q6H  . atorvastatin  40 mg Oral QHS  . bisacodyl  10 mg Oral Daily  . bupivacaine 0.5 % ON-Q pump SINGLE CATH 400 mL  400 mL Other To OR  . dutasteride  0.5 mg Oral QHS   And  . tamsulosin  0.4 mg Oral QHS  . fentaNYL   Intravenous Q4H  . metoprolol  50 mg Oral BID   Continuous Infusions: . dextrose 5 % and 0.9% NaCl 125 mL/hr at 06/02/13 1530  . insulin (NOVOLIN-R) infusion 5 mL/hr at 06/03/13 0625   PRN Meds:.albuterol, diphenhydrAMINE, diphenhydrAMINE, naloxone, ondansetron (ZOFRAN) IV, ondansetron (ZOFRAN) IV, oxyCODONE,  oxyCODONE-acetaminophen, oxyCODONE-acetaminophen, potassium chloride, potassium chloride, senna-docusate, sodium chloride, traMADol   CBGs [phone]    Physical Exam: General appearance: alert, cooperative and no distress Heart: regular rate and rhythm Lungs: Coarse BS bilaterally Wound: Dressed and dry Chest tube: No air leak    Lab Results: CBC: Recent Labs  06/02/13 1409 06/03/13 0410  WBC  --  13.9*  HGB 14.6 12.8*  HCT 43.0 37.4*  PLT  --  218   BMET:  Recent Labs  06/02/13 1409 06/03/13 0410  NA 133* 136  K 4.4 3.7  CL  --  100  CO2  --  25  GLUCOSE  --  242*  BUN  --  16  CREATININE  --  0.87  CALCIUM  --  8.0*    PT/INR: No results found for this basename: LABPROT, INR,  in the last 72 hours Radiology: Dg Chest Port 1 View  06/03/2013   CLINICAL DATA:  Post VATS  EXAM: PORTABLE CHEST - 1 VIEW  COMPARISON:  Portable exam 0632 hr compared to 06/02/2013  FINDINGS: Right jugular line stable tip projecting over cavoatrial junction.  Pair of left thoracostomy tubes unchanged.  Enlargement of cardiac silhouette post CABG.  Mediastinal contours and pulmonary vascularity normal.  Minimal right basilar  atelectasis.  Atelectasis versus consolidation left lower lobe.  No gross pleural effusion or pneumothorax.  Bones unremarkable.  IMPRESSION: Mild right basilar atelectasis.  Mild atelectasis versus consolidation in left lower lobe with slightly increased left basilar opacity versus previous exam.   Electronically Signed   By: Ulyses Southward M.D.   On: 06/03/2013 08:05   Dg Chest Portable 1 View  06/02/2013   CLINICAL DATA:  Postop  EXAM: PORTABLE CHEST - 1 VIEW  COMPARISON:  05/29/2013  FINDINGS: Status post left wedge resection. Two left chest tubes with no pneumothorax identified. Mild cardiomegaly stable. Mild vascular congestion and interstitial process diffusely suggesting mild postoperative edema. Right internal jugular central line identified  with tip over the cavoatrial junction. No pneumothorax on the right.  IMPRESSION: Acceptable postoperative appearance.   Electronically Signed   By: Esperanza Heir M.D.   On: 06/02/2013 14:50     Assessment/Plan: S/P Procedure(s) (LRB): VIDEO BRONCHOSCOPY (N/A) VIDEO ASSISTED THORACOSCOPY (VATS)/WEDGE RESECTION (Left) THORACOTOMY/LOBECTOMY (Left)  Chest tubes no air leak, but output high. CXR stable.  Continue CTs to suction.  HTN- BPs generally stable.  Resume home meds as tolerated.  DM- sugars high overnight, but improved since insulin gtt started.  On Humulin 70/30 tid and Metformin at home.  Continue gtt for now.  Will resume home meds once po intake improves.   Mobilize, work on [employer]	[place].  Decrease IVF, d/c A-line, continue Foley for volume measurement.  Routine POD 1 progression.  Martin,Mark H 06/03/2013 8:15 AM  I have seen and examined Mark Martin and agree with the above assessment  and plan.  Delight Ovens MD Beeper [phone] Office [phone] 06/03/2013 10:17 AM

## 2013-06-03 NOTE — Progress Notes (Signed)
Inpatient Diabetes Program Recommendations  AACE/ADA: New Consensus Statement on Inpatient Glycemic Control (2013)  Target Ranges:  Prepandial:   less than 140 mg/dL      Peak postprandial:   less than 180 mg/dL (1-2 hours)      Critically ill patients:  140 - 180 mg/dL   Reason for Visit: Results for MARCKUS, HANOVER (MRN [id number]) as of 06/03/2013 12:45  Ref. Range 06/03/2013 07:45 06/03/2013 08:54 06/03/2013 09:49 06/03/2013 10:57 06/03/2013 11:59  Glucose-Capillary Latest Range: 70-99 mg/dL 952 (H) 841 (H) 324 (H) 124 (H) 122 (H)   Note patient being transitioned off insulin gtt.  Please consider checking A1C to determine pre-hospitlization glycemic control.  Agree with Levemir dose.  Once patient is eating, consider adding Novolog 6 units tid with meals.   Will follow.  Beryl Meager, RN, BC-ADM Inpatient Diabetes Coordinator Pager [phone]

## 2013-06-03 NOTE — Care Management Note (Signed)
    Page 1 of 1   06/03/2013     1:25:40 PM   CARE MANAGEMENT NOTE 06/03/2013  Patient:  Mark Martin, Mark Martin   Account Number:  [id number]  Date Initiated:  06/03/2013  Documentation initiated by:  [facility]  Subjective/Objective Assessment:   post op VATS.     Action/Plan:   Anticipated DC Date:  06/08/2013   Anticipated DC Plan:  HOME W HOME HEALTH SERVICES      DC Planning Services  CM consult      Choice offered to / List presented to:             Status of service:  In process, will continue to follow Medicare Important Message given?   (If response is "NO", the following Medicare IM given date fields will be blank) Date Medicare IM given:   Date Additional Medicare IM given:    Discharge Disposition:    Per UR Regulation:  Reviewed for med. necessity/level of care/duration of stay  If discussed at Long Length of Stay Meetings, dates discussed:    Comments:  Contact:  Peach,Mahjabeen Daughter [phone]

## 2013-06-03 NOTE — Progress Notes (Signed)
TCTS BRIEF SICU PROGRESS NOTE  1 Day Post-Op  S/P Procedure(s) (LRB): VIDEO BRONCHOSCOPY (N/A) VIDEO ASSISTED THORACOSCOPY (VATS)/WEDGE RESECTION (Left) THORACOTOMY/LOBECTOMY (Left)   Stable day Resting comfortably NSR w/ stable BP O2 sats 95% on O2 2 L/min  Plan: Continue current plan  OWEN,CLARENCE H 06/03/2013 8:03 PM

## 2013-06-04 ENCOUNTER — Inpatient Hospital Stay (HOSPITAL_COMMUNITY): Payer: Medicare Other

## 2013-06-04 ENCOUNTER — Telehealth: Payer: Self-pay | Admitting: Cardiothoracic Surgery

## 2013-06-04 DIAGNOSIS — Z4682 Encounter for fitting and adjustment of non-vascular catheter: Secondary | ICD-10-CM | POA: Diagnosis not present

## 2013-06-04 DIAGNOSIS — J9819 Other pulmonary collapse: Secondary | ICD-10-CM | POA: Diagnosis not present

## 2013-06-04 LAB — GLUCOSE, CAPILLARY
Glucose-Capillary: 185 mg/dL — ABNORMAL HIGH (ref 70–99)
Glucose-Capillary: 160 mg/dL — ABNORMAL HIGH (ref 70–99)
Glucose-Capillary: 167 mg/dL — ABNORMAL HIGH (ref 70–99)
Glucose-Capillary: 118 mg/dL — ABNORMAL HIGH (ref 70–99)
Glucose-Capillary: 147 mg/dL — ABNORMAL HIGH (ref 70–99)
Glucose-Capillary: 146 mg/dL — ABNORMAL HIGH (ref 70–99)

## 2013-06-04 LAB — URINALYSIS, ROUTINE W REFLEX MICROSCOPIC
Bilirubin Urine: NEGATIVE
Glucose, UA: >1000 mg/dL — AB
Ketones, ur: NEGATIVE mg/dL
Leukocytes, UA: NEGATIVE
Nitrite: NEGATIVE
Protein, ur: 100 mg/dL — AB
Specific Gravity, Urine: 1.036 — ABNORMAL HIGH (ref 1.005–1.030)
Urobilinogen, UA: 0.2 mg/dL (ref 0.0–1.0)
pH: 6 (ref 5.0–8.0)

## 2013-06-04 LAB — COMPREHENSIVE METABOLIC PANEL
Albumin: 2.8 g/dL — ABNORMAL LOW (ref 3.5–5.2)
Alkaline Phosphatase: 92 U/L (ref 39–117)
BUN: 18 mg/dL (ref 6–23)
Calcium: 7.8 mg/dL — ABNORMAL LOW (ref 8.4–10.5)
Chloride: 101 mEq/L (ref 96–112)
Creatinine, Ser: 0.93 mg/dL (ref 0.50–1.35)
GFR calc Af Amer: >90 mL/min (ref >90)
Glucose, Bld: 180 mg/dL — ABNORMAL HIGH (ref 70–99)
Total Bilirubin: 0.7 mg/dL (ref 0.3–1.2)
Total Protein: 6.5 g/dL (ref 6.0–8.3)

## 2013-06-04 LAB — CBC
HCT: 37.4 % — ABNORMAL LOW (ref 39.0–52.0)
MCHC: 33.2 g/dL (ref 30.0–36.0)
MCV: 82.6 fL (ref 78.0–100.0)
Platelets: 223 10*3/uL (ref 150–400)
RDW: 15.3 % (ref 11.5–15.5)

## 2013-06-04 LAB — URINE MICROSCOPIC-ADD ON

## 2013-06-04 MED ORDER — INSULIN ASPART 100 UNIT/ML ~~LOC~~ SOLN
6.0000 [IU] | Freq: Three times a day (TID) | SUBCUTANEOUS | Status: DC
  Administered 2013-06-04 – 2013-06-08 (×11): 6 [IU] via SUBCUTANEOUS

## 2013-06-04 NOTE — Clinical Documentation Improvement (Signed)
THIS DOCUMENT IS NOT A PERMANENT PART OF THE MEDICAL RECORD  Please update your documentation with the medical record to reflect your response to this query. If you need help knowing how to do this please call [phone].  06/04/13   Dear Dr. Jeni Salles,  In a better effort to capture your patient's severity of illness, reflect appropriate length of stay and utilization of resources, a review of the patient medical record has revealed the following indicators.    Based on your clinical judgment, please clarify and document in a progress note and/or discharge summary the clinical condition associated with the following supporting information:  In responding to this query please exercise your independent judgment.  The fact that a query is asked, does not imply that any particular answer is desired or expected.   Please clarify and specify Diabetes control.  Possible Clinical Conditions?   Diabetes 2  "   Controlled or uncontrolled  "   Other Condition  "   Cannot Clinically determine    Risk Factors: Diabetes Mellitus type 2 noted per 10/07 H&P. DM-sugars high overnight, but improved since insulin drip started, noted per 10/08 progress notes.   Lab: Glucose, capillary: 10/07:  359 10/08:  305  Treatment: 10/08:  Insulin drip.  You may use possible, probable, or suspect with inpatient documentation. possible, probable, suspected diagnoses MUST be documented at the time of discharge  Reviewed: additional documentation in the medical record  Thank You,  Marciano Sequin,  Clinical Documentation Specialist: [phone] Health Information Management [place]

## 2013-06-04 NOTE — Progress Notes (Signed)
Patient ID: Mark Martin, male   DOB: [birth year], 57 y.o.   MRN: [id number]  SICU Evening Rounds:  Hemodynamically stable  Urine output good  CT output low  No problems today.

## 2013-06-04 NOTE — Progress Notes (Addendum)
Patient ID: Mark Martin, male   DOB: [birth year], 57 y.o.   MRN: [id number] TCTS DAILY ICU PROGRESS NOTE                   [address]            [place] [address]          [phone]   2 Days Post-Op Procedure(s) (LRB): VIDEO BRONCHOSCOPY (N/A) VIDEO ASSISTED THORACOSCOPY (VATS)/WEDGE RESECTION (Left) THORACOTOMY/LOBECTOMY (Left)  Total Length of Stay:  LOS: 2 days   Subjective: Feels well this am  Objective: Vital signs in last 24 hours: Temp:  [97.2 F (36.2 C)-100 F (37.8 C)] 97.7 F (36.5 C) (10/09 0747) Pulse Rate:  [75-106] 85 (10/09 0700) Cardiac Rhythm:  [-] Normal sinus rhythm (10/09 0700) Resp:  [18-32] 25 (10/09 0800) BP: (110-140)/(65-80) 115/69 mmHg (10/09 0700) SpO2:  [88 %-100 %] 96 % (10/09 0800) Arterial Line BP: (159)/(71) 159/71 mmHg (10/08 0900) Weight:  [204 lb 9.4 oz (92.8 kg)] 204 lb 9.4 oz (92.8 kg) (10/09 0500)  Filed Weights   06/03/13 0625 06/04/13 0500  Weight: 202 lb 13.2 oz (92 kg) 204 lb 9.4 oz (92.8 kg)    Weight change: 1 lb 12.2 oz (0.8 kg)   Hemodynamic parameters for last 24 hours:    Intake/Output from previous day: 10/08 0701 - 10/09 0700 In: 1433.6 [I.V.:1433.6] Out: 1030 [Urine:760; Chest Tube:270]  Intake/Output this shift:    Current Meds: Scheduled Meds: . atorvastatin  40 mg Oral QHS  . bisacodyl  10 mg Oral Daily  . dutasteride  0.5 mg Oral QHS   And  . tamsulosin  0.4 mg Oral QHS  . fentaNYL   Intravenous Q4H  . insulin aspart  0-24 Units Subcutaneous Q4H  . insulin detemir  50 Units Subcutaneous Daily  . metoprolol  50 mg Oral BID   Continuous Infusions: . dextrose 5 % and 0.9% NaCl 50 mL/hr at 06/03/13 1333   PRN Meds:.albuterol, diphenhydrAMINE, diphenhydrAMINE, naloxone, ondansetron (ZOFRAN) IV, ondansetron (ZOFRAN) IV, oxyCODONE-acetaminophen, oxyCODONE-acetaminophen, potassium chloride, senna-docusate, sodium chloride, traMADol  General appearance: alert and  cooperative Neurologic: intact Heart: regular rate and rhythm, S1, S2 normal, no murmur, click, rub or gallop Lungs: clear to auscultation bilaterally Abdomen: soft, non-tender; bowel sounds normal; no masses,  no organomegaly Extremities: extremities normal, atraumatic, no cyanosis or edema and Homans sign is negative, no sign of DVT Wound: no air leak  Lab Results: CBC: Recent Labs  06/03/13 0410 06/04/13 0500  WBC 13.9* 16.6*  HGB 12.8* 12.4*  HCT 37.4* 37.4*  PLT 218 223   BMET:  Recent Labs  06/03/13 0410 06/04/13 0500  NA 136 136  K 3.7 4.0  CL 100 101  CO2 25 25  GLUCOSE 242* 180*  BUN 16 18  CREATININE 0.87 0.93  CALCIUM 8.0* 7.8*    PT/INR: No results found for this basename: LABPROT, INR,  in the last 72 hours Radiology: Dg Chest Port 1 View  06/04/2013   CLINICAL DATA:  57 year old male status post left upper lobectomy with chest tubes in place.  EXAM: PORTABLE CHEST - 1 VIEW  COMPARISON:  06/03/2013 and earlier.  FINDINGS: Portable AP upright view at 0628 hr. Two left chest tubes are stable. Stable left lung volume and no pneumothorax identified. Stable right IJ central line. Mildly improved right lung volume. Decreased right lung base atelectasis. Stable cardiac size and mediastinal contours. Sequelae of CABG no large pleural effusion or edema.  IMPRESSION: 1. Stable lines and tubes. No pneumothorax identified.  2.  Mildly improved lung volumes and decreased atelectasis.   Electronically Signed   By: Augusto Gamble M.D.   On: 06/04/2013 07:30   PATH: Diagnosis 1. Lung, resection (segmental or lobe), Left Upper - ADENOCARCINOMA, WELL DIFFERENTIATED, SPANNING 3.5 CM. - THE SURGICAL RESECTION MARGINS ARE NEGATIVE FOR ADENOCARCINOMA. - THERE IS NO EVIDENCE OF CARCINOMA IN 4 OF 4 LYMPH NODES (0/4). - SEE ONCOLOGY TABLE BELOW. 2. Lymph node, biopsy, Lung, 10L - THERE IS NO EVIDENCE OF CARCINOMA IN 1 OF 1 LYMPH NODE (0/1). 3. Lymph node, biopsy, Lung, 4L - THERE IS NO  EVIDENCE OF CARCINOMA IN 1 OF 1 LYMPH NODE (0/1). Microscopic Comment  Stage IB- discussed with patient and copy of report given to him   Assessment/Plan: S/P Procedure(s) (LRB): VIDEO BRONCHOSCOPY (N/A) VIDEO ASSISTED THORACOSCOPY (VATS)/WEDGE RESECTION (Left) THORACOTOMY/LOBECTOMY (Left) Mobilize Diuresis Diabetes control- likely poor control preop HGBA1C pending. Postop has required insulin to control type 2 dm, now control better on long acting insulin and ss  ct to water seal     Mark Martin B 06/04/2013 8:19 AM

## 2013-06-05 ENCOUNTER — Encounter (HOSPITAL_COMMUNITY): Payer: Self-pay | Admitting: Cardiothoracic Surgery

## 2013-06-05 ENCOUNTER — Inpatient Hospital Stay (HOSPITAL_COMMUNITY): Payer: Medicare Other

## 2013-06-05 DIAGNOSIS — J95811 Postprocedural pneumothorax: Secondary | ICD-10-CM | POA: Diagnosis not present

## 2013-06-05 DIAGNOSIS — J9819 Other pulmonary collapse: Secondary | ICD-10-CM | POA: Diagnosis not present

## 2013-06-05 LAB — CBC
HCT: 34.3 % — ABNORMAL LOW (ref 39.0–52.0)
Hemoglobin: 11.4 g/dL — ABNORMAL LOW (ref 13.0–17.0)
MCH: 27.5 pg (ref 26.0–34.0)
MCHC: 33.2 g/dL (ref 30.0–36.0)
MCV: 82.9 fL (ref 78.0–100.0)
Platelets: 204 10*3/uL (ref 150–400)
RBC: 4.14 MIL/uL — ABNORMAL LOW (ref 4.22–5.81)
RDW: 15.4 % (ref 11.5–15.5)
WBC: 13.3 10*3/uL — ABNORMAL HIGH (ref 4.0–10.5)

## 2013-06-05 LAB — BASIC METABOLIC PANEL
BUN: 22 mg/dL (ref 6–23)
CO2: 24 mEq/L (ref 19–32)
Calcium: 7.8 mg/dL — ABNORMAL LOW (ref 8.4–10.5)
Chloride: 102 mEq/L (ref 96–112)
Creatinine, Ser: 0.84 mg/dL (ref 0.50–1.35)
GFR calc Af Amer: >90 mL/min (ref >90)
GFR calc non Af Amer: >90 mL/min (ref >90)
Glucose, Bld: 166 mg/dL — ABNORMAL HIGH (ref 70–99)
Potassium: 3.9 mEq/L (ref 3.5–5.1)
Sodium: 135 mEq/L (ref 135–145)

## 2013-06-05 LAB — GLUCOSE, CAPILLARY
Glucose-Capillary: 166 mg/dL — ABNORMAL HIGH (ref 70–99)
Glucose-Capillary: 152 mg/dL — ABNORMAL HIGH (ref 70–99)
Glucose-Capillary: 188 mg/dL — ABNORMAL HIGH (ref 70–99)
Glucose-Capillary: 90 mg/dL (ref 70–99)
Glucose-Capillary: 102 mg/dL — ABNORMAL HIGH (ref 70–99)
Glucose-Capillary: 87 mg/dL (ref 70–99)

## 2013-06-05 LAB — HEMOGLOBIN A1C
Hgb A1c MFr Bld: 8.9 % — ABNORMAL HIGH (ref <5.7)
Mean Plasma Glucose: 209 mg/dL — ABNORMAL HIGH (ref <117)

## 2013-06-05 LAB — URINE CULTURE
Colony Count: NO GROWTH
Culture: NO GROWTH

## 2013-06-05 NOTE — Progress Notes (Signed)
On Q pump removed, tip intact- no complications. Removed prior to 72 hour mark, as line is clotted and no longer infusing.   Mark Martin

## 2013-06-05 NOTE — Progress Notes (Signed)
Fentanyl PCA injection changed. 60 mcg wasted by 2 RN's in sink.

## 2013-06-05 NOTE — Op Note (Signed)
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  SURGICAL PROCEDURE:  Bronchoscopy left video-assisted thoracoscopy, mini thoracotomy, left upper lobe resection with lymph node dissection, and placement of On-Q device.  SURGEON:  Sheliah Plane, MD  FIRST ASSISTANT:  Rowe Clack, P.A.-C.  BRIEF HISTORY:  The patient is a 57 year old [place] male with no previous history of smoking, who presents with a slowly enlarging left upper lobe lung lesion.  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 Cornelius Moras, who was referred because of enlarging lung lesion.  MRI of the brain, CT of the chest, and PET scan were performed.  Clinically, the mass was suspicious for malignancy and contained in one lobe.  Because of the previous bypass surgery, we chose the mammary artery preoperative needle.  Biopsy of lung was done to confirm the tissue diagnosis.  This was consistent with adenocarcinoma.  Risks and options of the resection were discussed with the patient in detail.  He agreed and signed informed consent.  DESCRIPTION OF PROCEDURE:  The patient underwent general endotracheal anesthesia without incident.  Appropriate time-out was performed and through a single-lumen endotracheal tube, a fiberoptic bronchoscope was passed to the subsegmental level both in the right and left tracheobronchial tree.  There were no endobronchial lesions appreciated. The scope was removed.  The patient was then turned in lateral decubitus position with  the left side up.  A double-lumen endotracheal tube had been placed.  The left lung was collapsed.  The left chest was prepped with Betadine and draped in sterile manner in approximately fourth intercostal space midaxillary line.  A small port site was created and the left chest was entered.  There was no evidence of pleural spread of disease and the chest was explored visually with the scope.  The lung was densely adherent to the apex of the chest and all along the medial aspect of the chest cavity and to the heart all consistent with the path of the mammary artery.  To facilitate the dissection of the lung off the mediastinum and to prevent any injury to the mammary artery the incision was enlarged and through 2 port sites that were ultimately used as chest tubes site and through the incision a tedious dissection along the heart inferiorly and superiorly was performed ultimately freeing the left upper lobe from the mediastinum.  The mammary artery was visualized and was not injured.  With the lung freed up, we then proceeded with isolation of the left upper lobe pulmonary vein and this was divided with a vascular stapler.  In a similar fashion, the four separate pulmonary artery branches to the upper lobe were identified and divided with a vascular stapler.  The bronchus to the left upper  lobe was then stapled with a black stapler prior to stapling the bronchus.  The left lung was ventilated confirming isolation of the upper lobe.  The lobe was removed.  We then proceeded with dissection of mediastinal nodes and the 4R and 10R nodes were dissected from the mediastinum separately and submitted to Pathology.  The bronchial stump was checked for air leak. Two 28 chest tubes were left in place.  The incision was then closed with pericostal stitches drilled through the bone.  The muscle layers were closed with interrupted 0 Vicryl.  The On-Q device was tunneled separately and placed in the  depth of the wound.  Attempt to tunnel this subpleurally was made, difficult with the tunneling device.  The muscle layers were closed with interrupted 0 Vicryl, running 2-0 Vicryl, and subcutaneous tissue, 3-0 subcuticular stitch in skin edges.  Dry dressings were applied.  Sponge and needle count was reported as correct at completion of procedure.  The patient tolerated the procedure without obvious complication.  He was transferred to Surgical Intensive Care Unit for further postoperative care.  Estimated blood loss between 250 and 300 mL.  The patient was extubated in the operating room and transferred to the recovery room in good condition.     Sheliah Plane, MD     EG/MEDQ  D:  06/04/2013  T:  06/05/2013  Job:  [id number]  cc:   Eual Fines A. Chodri, M.D.

## 2013-06-05 NOTE — Progress Notes (Signed)
Pt arrived to the unit via wheel chair. Pt transferred to the bedside chair assess and connect to the monitors. Pt has call bell in reach and family at bedside. No complaints at this time. Will continue to monitor

## 2013-06-05 NOTE — Progress Notes (Signed)
Patient ID: Mark Martin, male   DOB: [birth year], 57 y.o.   MRN: [id number] TCTS DAILY ICU PROGRESS NOTE      [address]       [place] [address]             [phone]        3 Days Post-Op Procedure(s) (LRB): VIDEO BRONCHOSCOPY (N/A) VIDEO ASSISTED THORACOSCOPY (VATS)/WEDGE RESECTION (Left) THORACOTOMY/LOBECTOMY (Left)  Total Length of Stay:  LOS: 3 days   Subjective: Feels well this am, was unable to void last night after foley removal  Objective: Vital signs in last 24 hours: Temp:  [97.7 F (36.5 C)-99.8 F (37.7 C)] 98.7 F (37.1 C) (10/10 0400) Pulse Rate:  [77-100] 84 (10/10 0500) Cardiac Rhythm:  [-] Normal sinus rhythm (10/10 0600) Resp:  [17-27] 20 (10/10 0500) BP: (103-137)/(56-81) 126/80 mmHg (10/10 0500) SpO2:  [88 %-98 %] 98 % (10/10 0500) Weight:  [206 lb 12.7 oz (93.8 kg)] 206 lb 12.7 oz (93.8 kg) (10/10 0600)  Filed Weights   06/03/13 0625 06/04/13 0500 06/05/13 0600  Weight: 202 lb 13.2 oz (92 kg) 204 lb 9.4 oz (92.8 kg) 206 lb 12.7 oz (93.8 kg)    Weight change: 2 lb 3.3 oz (1 kg)   Hemodynamic parameters for last 24 hours:    Intake/Output from previous day: 10/09 0701 - 10/10 0700 In: 2296.5 [P.O.:1080; I.V.:1216.5] Out: 720 [Urine:570; Chest Tube:150]  Intake/Output this shift:    Current Meds: Scheduled Meds: . atorvastatin  40 mg Oral QHS  . bisacodyl  10 mg Oral Daily  . dutasteride  0.5 mg Oral QHS   And  . tamsulosin  0.4 mg Oral QHS  . fentaNYL   Intravenous Q4H  . insulin aspart  0-24 Units Subcutaneous Q4H  . insulin aspart  6 Units Subcutaneous TID WC  . insulin detemir  50 Units Subcutaneous Daily  . metoprolol  50 mg Oral BID   Continuous Infusions: . dextrose 5 % and 0.9% NaCl 50 mL/hr at 06/05/13 0400   PRN Meds:.albuterol, diphenhydrAMINE, diphenhydrAMINE, naloxone, ondansetron (ZOFRAN) IV, ondansetron (ZOFRAN) IV, oxyCODONE-acetaminophen, oxyCODONE-acetaminophen, potassium chloride,  senna-docusate, sodium chloride, traMADol  General appearance: alert and cooperative Neurologic: intact Heart: regular rate and rhythm, S1, S2 normal, no murmur, click, rub or gallop Lungs: clear to auscultation bilaterally Abdomen: soft, non-tender; bowel sounds normal; no masses,  no organomegaly Extremities: extremities normal, atraumatic, no cyanosis or edema and Homans sign is negative, no sign of DVT Wound: no air leak  Lab Results: CBC:  Recent Labs  06/04/13 0500 06/05/13 0456  WBC 16.6* 13.3*  HGB 12.4* 11.4*  HCT 37.4* 34.3*  PLT 223 204   BMET:   Recent Labs  06/04/13 0500 06/05/13 0456  NA 136 135  K 4.0 3.9  CL 101 102  CO2 25 24  GLUCOSE 180* 166*  BUN 18 22  CREATININE 0.93 0.84  CALCIUM 7.8* 7.8*    PT/INR: No results found for this basename: LABPROT, INR,  in the last 72 hours Radiology: Dg Chest Port 1 View  06/04/2013   CLINICAL DATA:  57 year old male status post left upper lobectomy with chest tubes in place.  EXAM: PORTABLE CHEST - 1 VIEW  COMPARISON:  06/03/2013 and earlier.  FINDINGS: Portable AP upright view at 0628 hr. Two left chest tubes are stable. Stable left lung volume and no pneumothorax identified. Stable right IJ central line. Mildly improved right lung volume. Decreased right lung base atelectasis. Stable cardiac size  and mediastinal contours. Sequelae of CABG no large pleural effusion or edema.  IMPRESSION: 1. Stable lines and tubes. No pneumothorax identified.  2.  Mildly improved lung volumes and decreased atelectasis.   Electronically Signed   By: Augusto Gamble M.D.   On: 06/04/2013 07:30    Assessment/Plan: S/P Procedure(s) (LRB): VIDEO BRONCHOSCOPY (N/A) VIDEO ASSISTED THORACOSCOPY (VATS)/WEDGE RESECTION (Left) THORACOTOMY/LOBECTOMY (Left) Mobilize Diuresis Diabetes control- likely poor control preop HGBA1C pending. Postop has required insulin to control type 2 dm, now control better on long acting insulin and ss -166 this am ct  on water seal, d/c posterior chest tube Urinary retention requiring replacement of foley- continue flomax    Larna Capelle B 06/05/2013 7:28 AM

## 2013-06-05 NOTE — Progress Notes (Signed)
Pt still unable to void since foley removed. Foley catheter placed per order. Sterile technique used. Clear, amber urine return noted. Pt tolerated procedure well. Will continue to monitor closely. Kerin Ransom, RN

## 2013-06-05 NOTE — Progress Notes (Signed)
Wasted Fentanyl PCA Injection in sink with Kerin Ransom RN, P.Allena Katz

## 2013-06-05 NOTE — Progress Notes (Addendum)
Pt. Unable to void, no urgency; foley removed 9 hours ago; bladder scan done, 226 cc urine noted. MD notified orders received and implemented.

## 2013-06-06 ENCOUNTER — Inpatient Hospital Stay (HOSPITAL_COMMUNITY): Payer: Medicare Other

## 2013-06-06 DIAGNOSIS — R0989 Other specified symptoms and signs involving the circulatory and respiratory systems: Secondary | ICD-10-CM | POA: Diagnosis not present

## 2013-06-06 DIAGNOSIS — Z4682 Encounter for fitting and adjustment of non-vascular catheter: Secondary | ICD-10-CM | POA: Diagnosis not present

## 2013-06-06 DIAGNOSIS — J9819 Other pulmonary collapse: Secondary | ICD-10-CM | POA: Diagnosis not present

## 2013-06-06 DIAGNOSIS — J95811 Postprocedural pneumothorax: Secondary | ICD-10-CM | POA: Diagnosis not present

## 2013-06-06 LAB — CBC
HCT: 32.4 % — ABNORMAL LOW (ref 39.0–52.0)
Hemoglobin: 11 g/dL — ABNORMAL LOW (ref 13.0–17.0)
MCH: 27.6 pg (ref 26.0–34.0)
MCHC: 34 g/dL (ref 30.0–36.0)
MCV: 81.2 fL (ref 78.0–100.0)
Platelets: 225 10*3/uL (ref 150–400)
RBC: 3.99 MIL/uL — ABNORMAL LOW (ref 4.22–5.81)
RDW: 15.1 % (ref 11.5–15.5)
WBC: 9.7 10*3/uL (ref 4.0–10.5)

## 2013-06-06 LAB — BASIC METABOLIC PANEL
BUN: 18 mg/dL (ref 6–23)
CO2: 24 mEq/L (ref 19–32)
Calcium: 7.8 mg/dL — ABNORMAL LOW (ref 8.4–10.5)
Chloride: 102 mEq/L (ref 96–112)
Creatinine, Ser: 0.8 mg/dL (ref 0.50–1.35)
GFR calc Af Amer: >90 mL/min (ref >90)
GFR calc non Af Amer: >90 mL/min (ref >90)
Glucose, Bld: 96 mg/dL (ref 70–99)
Potassium: 3.1 mEq/L — ABNORMAL LOW (ref 3.5–5.1)
Sodium: 136 mEq/L (ref 135–145)

## 2013-06-06 LAB — GLUCOSE, CAPILLARY
Glucose-Capillary: 119 mg/dL — ABNORMAL HIGH (ref 70–99)
Glucose-Capillary: 131 mg/dL — ABNORMAL HIGH (ref 70–99)
Glucose-Capillary: 173 mg/dL — ABNORMAL HIGH (ref 70–99)
Glucose-Capillary: 63 mg/dL — ABNORMAL LOW (ref 70–99)
Glucose-Capillary: 76 mg/dL (ref 70–99)
Glucose-Capillary: 78 mg/dL (ref 70–99)
Glucose-Capillary: 81 mg/dL (ref 70–99)

## 2013-06-06 MED ORDER — INSULIN ASPART 100 UNIT/ML ~~LOC~~ SOLN
0.0000 [IU] | Freq: Three times a day (TID) | SUBCUTANEOUS | Status: DC
  Administered 2013-06-06: 2 [IU] via SUBCUTANEOUS
  Administered 2013-06-07: 8 [IU] via SUBCUTANEOUS
  Administered 2013-06-07: 2 [IU] via SUBCUTANEOUS
  Administered 2013-06-07: 8 [IU] via SUBCUTANEOUS
  Administered 2013-06-08: 2 [IU] via SUBCUTANEOUS
  Administered 2013-06-08: 8 [IU] via SUBCUTANEOUS

## 2013-06-06 NOTE — Progress Notes (Addendum)
[address]       [place] [address]             [phone]          4 Days Post-Op Procedure(s) (LRB): VIDEO BRONCHOSCOPY (N/A) VIDEO ASSISTED THORACOSCOPY (VATS)/WEDGE RESECTION (Left) THORACOTOMY/LOBECTOMY (Left)  Subjective: Feels well this am, no complaints.   Objective: Vital signs in last 24 hours: Patient Vitals for the past 24 hrs:  BP Temp Temp src Pulse Resp SpO2  06/06/13 0800 - 98.5 F (36.9 C) Oral - 17 -  06/06/13 0400 - - - - 26 93 %  06/06/13 0300 119/65 mmHg 99.2 F (37.3 C) Oral 93 26 93 %  06/06/13 0030 124/73 mmHg 99 F (37.2 C) Oral 88 23 91 %  06/06/13 0000 - - - - 25 95 %  06/05/13 2033 145/84 mmHg 99.5 F (37.5 C) Oral 105 25 95 %  06/05/13 2000 - - - - 25 95 %  06/05/13 1600 - - - - 26 94 %  06/05/13 1558 147/80 mmHg 98.9 F (37.2 C) Oral 99 24 89 %  06/05/13 1449 135/68 mmHg 98.7 F (37.1 C) Oral 99 22 94 %  06/05/13 1300 134/79 mmHg - - 84 24 100 %  06/05/13 1201 - 98.6 F (37 C) Oral - - -  06/05/13 1200 - - - 89 24 96 %  06/05/13 1100 145/85 mmHg - - 92 23 99 %  06/05/13 1000 129/68 mmHg - - 85 19 95 %  06/05/13 0900 123/71 mmHg - - 96 24 94 %   Current Weight  06/05/13 206 lb 12.7 oz (93.8 kg)     Intake/Output from previous day: 10/10 0701 - 10/11 0700 In: 1344.5 [P.O.:1040; I.V.:304.5] Out: 1187 [Urine:1030; Stool:2; Chest Tube:155]  CBGs 102-87-78-81-96-173   PHYSICAL EXAM:  Heart: RRR Lungs: Slightly decreased BS on L Wound: Clean and dry Chest tube: No air leak    Lab Results: CBC: Recent Labs  06/05/13 0456 06/06/13 0500  WBC 13.3* 9.7  HGB 11.4* 11.0*  HCT 34.3* 32.4*  PLT 204 225   BMET:  Recent Labs  06/05/13 0456 06/06/13 0500  NA 135 136  K 3.9 3.1*  CL 102 102  CO2 24 24  GLUCOSE 166* 96  BUN 22 18  CREATININE 0.84 0.80  CALCIUM 7.8* 7.8*    PT/INR: No results found for this basename: LABPROT, INR,  in the last 72 hours  CXR: FINDINGS:  The minimal  left pneumothorax present on the previous day's study is  not evident currently. 1 of the 2 left chest tubes has been removed.  Bronchovascular prominence seen previously has improved which may be  technical all reflect improved vascular congestion. Mild basilar  opacity, more notable on the left, stable in likely atelectasis.  Right internal jugular central venous line is stable in well  positioned.  IMPRESSION:  Status post removal of 1 of the 2 left chest tubes.  Minimal left pneumothorax seen on the previous day's study is no  longer present.  Improved vascular congestion. Mild persistent basilar atelectasis.  No other change from the prior study.   Assessment/Plan: S/P Procedure(s) (LRB): VIDEO BRONCHOSCOPY (N/A) VIDEO ASSISTED THORACOSCOPY (VATS)/WEDGE RESECTION (Left) THORACOTOMY/LOBECTOMY (Left) CT with no air leak, CXR stable.  Hopefully can d/c remaining CT soon. DM- sugars with improved control on Levemir, Novolog.  A1C=8.9.   GU- urinary retention requiring Foley replacement yesterday. Will leave in today, possibly can  attempt voiding trial tomorrow. Flomax restarted. Continue ambulation, pulm toilet.   LOS: 4 days    [name],[name] 06/06/2013  D/c foley late tonight and see if can pass urine in am I have seen and examined Mark Martin and agree with the above assessment  and plan.  Delight Ovens MD Beeper [phone] Office [phone] 06/06/2013 6:11 PM

## 2013-06-06 NOTE — Progress Notes (Signed)
Fentanyl PCA, 34 ml, wasted in sink. Witnessed by Dawn Fields, RN. Mark Martin 

## 2013-06-07 ENCOUNTER — Inpatient Hospital Stay (HOSPITAL_COMMUNITY): Payer: Medicare Other

## 2013-06-07 DIAGNOSIS — J9383 Other pneumothorax: Secondary | ICD-10-CM | POA: Diagnosis not present

## 2013-06-07 LAB — GLUCOSE, CAPILLARY
Glucose-Capillary: 133 mg/dL — ABNORMAL HIGH (ref 70–99)
Glucose-Capillary: 221 mg/dL — ABNORMAL HIGH (ref 70–99)
Glucose-Capillary: 205 mg/dL — ABNORMAL HIGH (ref 70–99)
Glucose-Capillary: 133 mg/dL — ABNORMAL HIGH (ref 70–99)

## 2013-06-07 MED ORDER — RAMIPRIL 10 MG PO CAPS
10.0000 mg | ORAL_CAPSULE | Freq: Every day | ORAL | Status: DC
  Administered 2013-06-07 – 2013-06-08 (×2): 10 mg via ORAL
  Filled 2013-06-07 (×2): qty 1

## 2013-06-07 NOTE — Progress Notes (Signed)
Patient has voided numerous times today in small amounts. Bladder scanned patient after last 100 cc void, scan showed 0 cc in bladder at this time. Will continue to monitor patient.

## 2013-06-07 NOTE — Progress Notes (Addendum)
[address]       [name] [address]             [phone]          5 Days Post-Op Procedure(s) (LRB): VIDEO BRONCHOSCOPY (N/A) VIDEO ASSISTED THORACOSCOPY (VATS)/WEDGE RESECTION (Left) THORACOTOMY/LOBECTOMY (Left)  Subjective: Pt doing well overall.  Foley was just removed at 0630 instead of midnight, as ordered.  Pt has not voided yet, but does not feel that he has to go.  Breathing stable.   Objective: Vital signs in last 24 hours: Patient Vitals for the past 24 hrs:  BP Temp Temp src Pulse Resp SpO2  06/07/13 0521 125/69 mmHg 97.7 F (36.5 C) Oral 83 18 98 %  06/06/13 2325 171/89 mmHg 98 F (36.7 C) Oral 85 26 95 %  06/06/13 1916 157/77 mmHg 99.1 F (37.3 C) Oral 88 19 97 %  06/06/13 1600 126/73 mmHg 98.1 F (36.7 C) Oral - - -  06/06/13 1200 - 97.8 F (36.6 C) Oral - - -   Current Weight  06/05/13 206 lb 12.7 oz (93.8 kg)     Intake/Output from previous day: 10/11 0701 - 10/12 0700 In: 480 [P.O.:480] Out: 2175 [Urine:2175]  CBGs 131-63-76-76-119   PHYSICAL EXAM:  Heart: RRR Lungs: Clear Wound: Clean and dry    Lab Results: CBC: Recent Labs  06/05/13 0456 06/06/13 0500  WBC 13.3* 9.7  HGB 11.4* 11.0*  HCT 34.3* 32.4*  PLT 204 225   BMET:  Recent Labs  06/05/13 0456 06/06/13 0500  NA 135 136  K 3.9 3.1*  CL 102 102  CO2 24 24  GLUCOSE 166* 96  BUN 22 18  CREATININE 0.84 0.80  CALCIUM 7.8* 7.8*    PT/INR: No results found for this basename: LABPROT, INR,  in the last 72 hours  BJY:NWGNFAOZ:  Minimal left pneumothorax is now evident. The difference between the  current and prior studies likely technical only.  There is also an air-fluid level that projects along the superior  lateral margin of the left hilum. This may reflect an area of  loculated hydro pneumothorax. It could reflect a lung cavity. The  former is favored. This was not visible in the prior exam.  There is hazy airspace opacity in the  left lower lung that may  reflect a combination pleural fluid atelectasis. Infiltrate is  possible. This is similar allowing for differences in patient  positioning and technique.  Mild medial right lung base atelectasis. The right lung is otherwise  clear.  The heart is mildly enlarged. Changes from CABG surgery are stable.  Right internal jugular central venous line is stable with its tip in  the lower superior vena cava.  IMPRESSION:  1. Tiny pneumothorax is now evident on the left.  2. There is also new air-fluid level that projects in the left upper  lobe anteriorly, which may reflect a lung cavity or a loculated  hydro pneumothorax.  3. Persistent opacity in the left lower lung zone consistent with a  small amount of pleural fluid with atelectasis, infiltrate or a  combination. This is similar to the previous day's study.    Assessment/Plan: S/P Procedure(s) (LRB): VIDEO BRONCHOSCOPY (N/A) VIDEO ASSISTED THORACOSCOPY (VATS)/WEDGE RESECTION (Left) THORACOTOMY/LOBECTOMY (Left) Stable from surgical standpoint. Major issue at present is urinary retention.  Foley was ordered to be removed at midnight, so we could evaluate voiding on rounds this am, but night nurse did not remove until  6:30 this am.  We will watch this am and give him an opportunity to void. If he is unable to void by lunch time, will bladder scan and reinsert Foley. He has seen a urologist in [place] within the past year, but has done well since started on meds. HTN- resume Altace, continue Lopressor. DM- sugars controlled on Levemir, Novolog. A1C= 8.9. He was on 70/30 and po metformin at home. Will probably d/c on Levemir- will d/w MD.  Home once voiding issues resolved.   LOS: 5 days    [name],[name] 06/07/2013  Delay in foley removal Home in am if able to void ok I have seen and examined Mark Martin and agree with the above assessment  and plan.  Delight Ovens MD Beeper [phone] Office  [phone] 06/07/2013 11:22 AM

## 2013-06-08 ENCOUNTER — Inpatient Hospital Stay (HOSPITAL_COMMUNITY): Payer: Medicare Other

## 2013-06-08 DIAGNOSIS — J9383 Other pneumothorax: Secondary | ICD-10-CM | POA: Diagnosis not present

## 2013-06-08 DIAGNOSIS — J90 Pleural effusion, not elsewhere classified: Secondary | ICD-10-CM | POA: Diagnosis not present

## 2013-06-08 LAB — BASIC METABOLIC PANEL
BUN: 12 mg/dL (ref 6–23)
CO2: 26 mEq/L (ref 19–32)
Calcium: 8.3 mg/dL — ABNORMAL LOW (ref 8.4–10.5)
Creatinine, Ser: 0.83 mg/dL (ref 0.50–1.35)
GFR calc non Af Amer: >90 mL/min (ref >90)
Glucose, Bld: 92 mg/dL (ref 70–99)
Potassium: 3.1 mEq/L — ABNORMAL LOW (ref 3.5–5.1)
Sodium: 131 mEq/L — ABNORMAL LOW (ref 135–145)

## 2013-06-08 LAB — GLUCOSE, CAPILLARY
Glucose-Capillary: 127 mg/dL — ABNORMAL HIGH (ref 70–99)
Glucose-Capillary: 232 mg/dL — ABNORMAL HIGH (ref 70–99)

## 2013-06-08 MED ORDER — TRAMADOL HCL 50 MG PO TABS: 50.0000 mg | ORAL_TABLET | Freq: Four times a day (QID) | ORAL | Status: DC | PRN

## 2013-06-08 MED ORDER — OXYCODONE-ACETAMINOPHEN 5-325 MG PO TABS: 1.0000 | ORAL_TABLET | ORAL | Status: DC | PRN

## 2013-06-08 MED ORDER — POTASSIUM CHLORIDE ER 10 MEQ PO TBCR
40.0000 meq | EXTENDED_RELEASE_TABLET | Freq: Once | ORAL | Status: AC
  Administered 2013-06-08: 40 meq via ORAL
  Filled 2013-06-08: qty 4

## 2013-06-08 NOTE — Progress Notes (Addendum)
       [address]       [name] [address]             [phone]          6 Days Post-Op Procedure(s) (LRB): VIDEO BRONCHOSCOPY (N/A) VIDEO ASSISTED THORACOSCOPY (VATS)/WEDGE RESECTION (Left) THORACOTOMY/LOBECTOMY (Left)  Subjective: Voiding without difficulty since Foley removed. Comfortable, no complaints.  Ready to go home.   Objective: Vital signs in last 24 hours: Patient Vitals for the past 24 hrs:  BP Temp Temp src Pulse Resp SpO2  06/08/13 0349 143/72 mmHg 98.2 F (36.8 C) Oral 78 20 97 %  06/07/13 2344 127/76 mmHg 98.4 F (36.9 C) Oral 78 17 99 %  06/07/13 2045 153/80 mmHg 98.6 F (37 C) Oral 93 18 98 %  06/07/13 1606 - 98 F (36.7 C) Oral 82 20 98 %  06/07/13 1200 112/54 mmHg 98.1 F (36.7 C) Oral - - -   Current Weight  06/05/13 206 lb 12.7 oz (93.8 kg)     Intake/Output from previous day: 10/12 0701 - 10/13 0700 In: 720 [P.O.:720] Out: 826 [Urine:826]  CBGs 205-133-92   PHYSICAL EXAM:  Heart: RRR Lungs: Clear Wound: Clean and dry    Lab Results: CBC: Recent Labs  06/06/13 0500  WBC 9.7  HGB 11.0*  HCT 32.4*  PLT 225   BMET:  Recent Labs  06/06/13 0500 06/08/13 0500  NA 136 131*  K 3.1* 3.1*  CL 102 96  CO2 24 26  GLUCOSE 96 92  BUN 18 12  CREATININE 0.80 0.83  CALCIUM 7.8* 8.3*    PT/INR: No results found for this basename: LABPROT, INR,  in the last 72 hours  CXR: FINDINGS:  Central venous catheter is unchanged. There is a tiny peripheral  left apical pneumothorax as well as a loculated small left anterior  hydropneumothorax. Aeration at the right base has improved. Tiny  left effusion. Left lung is clear.  IMPRESSION:  Improved aeration at the left base. Persistent small left apical  pneumothorax and anterior the left hydro- pneumothorax.    Assessment/Plan: S/P Procedure(s) (LRB): VIDEO BRONCHOSCOPY (N/A) VIDEO ASSISTED THORACOSCOPY (VATS)/WEDGE RESECTION (Left) THORACOTOMY/LOBECTOMY  (Left) Stable from thoracic standpoint, voiding without difficulty.  Hypokalemia- replace K+. Plan d/c home today- instructions reviewed with pt.   LOS: 6 days    [name],[name] 06/08/2013  Home today I have seen and examined Arnetha Gula and agree with the above assessment  and plan.  Delight Ovens MD Beeper [phone] Office [phone] 06/08/2013 8:41 AM

## 2013-06-08 NOTE — Progress Notes (Signed)
Patient discharged home per MD order. All instructions given, pt and wife verbalized a understanding .

## 2013-06-08 NOTE — Discharge Summary (Signed)
[address]       [name] [address]             [phone]              Discharge Summary  Name: Mark Martin DOB: [birth year] 57 y.o. MRN: [id number]   Admission Date: 06/02/2013 Discharge Date: 06/08/2013    Admitting Diagnosis: Left upper lobe lung mass   Discharge Diagnosis:  Well differentiated adenocarcinoma, left upper lobe (T2, N0) Postoperative urinary retention Expected postoperative blood loss anemia  Past Medical History  Diagnosis Date  . Hyperlipidemia   . Hypertension   . BPH (benign prostatic hypertrophy)   . Mass of lung     RIGHT UPPER LOBE  . Diabetes mellitus without complication     TYPE 2  . 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 Glaucoma   . Shortness of breath     WITH EXERTION  . Arthritis   . Ankle injury     RIGHT  . H/O: knee surgery     2 PINS IN RIGHT KNEE AND IN RIGHT ANKLE     Procedures: VIDEO BRONCHOSCOPY LEFT VIDEO ASSISTED THORACOSCOPY/MINI-THORACOTOMY, LEFT UPPER LOBECTOMY, LYMPH NODE DISSECTION on 06/02/2013    HPI:  The patient is a 57 y.o. male nonsmoker who was referred to Dr. Tyrone Sage for evaluation of a left upper lobe lung mass. The patient was treated for a "pneumonic" process in December 2013 with antibiotics. In June, he noted some increase in wheezing and cough, and chest x-ray showed persistence of a left lung mass. Subsequent CT of the chest and PET scan were performed. These confirmed a 2.6 x 3.3 cm mass in the left upper lobe lingula with low level metabolic activity (SUV= 2.1).  He denies any hemoptysis. He does have some shortness of breath with exertion, but is without symptoms with usual activities. Dr. Tyrone Sage saw the patient and reviewed his films.  A needle biopsy was ordered, which revealed adenocarcinoma.  It was felt that he would require a left VATS resection,  probable upper lobectomy.  All risks, benefits and alternatives of surgery were explained in detail, and the patient agreed to proceed.     Hospital Course:  The patient was admitted to [facility] on 06/02/2013. The patient was taken to the operating room and underwent the above procedure.    The postoperative course has been notable for urinary retention, requiring reinsertion of his Foley catheter.  He was treated conservatively with bladder rest and was restarted on his home Flomax/Avodart. The Foley has been removed successfully at this point, and he is voiding without difficulty.    His pulmonary status has remained stable, and he has been weaned from supplemental oxygen.  Chest tubes have been removed in the standard fashion, and follow up chest x-rays have remained stable.  Final pathology was positive for adenocarcinoma (T2, N0).  Incisions are healing well. Blood sugars initially were high and required an insulin drip, but this was weaned, and he was started on Levemir, with improved control.  His preop hemoglobin A1C was noted to be 8.9, and he has been instructed to follow up with his medical doctor for re-evaluation of his medication regimen as an outpatient.  He is ambulating independently, and vital signs are stable.  He is  medically stable on today's date for discharge home.     Recent vital signs:  Filed Vitals:   06/08/13 0349  BP: 143/72  Pulse: 78  Temp: 98.2 F (36.8 C)  Resp: 20    Recent laboratory studies:  CBC: Recent Labs  06/06/13 0500  WBC 9.7  HGB 11.0*  HCT 32.4*  PLT 225   BMET:  Recent Labs  06/06/13 0500 06/08/13 0500  NA 136 131*  K 3.1* 3.1*  CL 102 96  CO2 24 26  GLUCOSE 96 92  BUN 18 12  CREATININE 0.80 0.83  CALCIUM 7.8* 8.3*    PT/INR: No results found for this basename: LABPROT, INR,  in the last 72 hours   Discharge Medications:     Medication List         acetaminophen 325 MG tablet  Commonly known as:  TYLENOL  Take 325  mg by mouth daily as needed for pain.     albuterol 108 (90 BASE) MCG/ACT inhaler  Commonly known as:  PROVENTIL HFA;VENTOLIN HFA  Inhale 2 puffs into the lungs every 6 (six) hours as needed for wheezing or shortness of breath.     aspirin EC 81 MG tablet  Take 81 mg by mouth daily.     atorvastatin 40 MG tablet  Commonly known as:  LIPITOR  Take 40 mg by mouth at bedtime.     hydrochlorothiazide 25 MG tablet  Commonly known as:  HYDRODIURIL  Take 25 mg by mouth daily.     insulin NPH-regular (70-30) 100 UNIT/ML injection  Commonly known as:  NOVOLIN 70/30  Inject 45-55 Units into the skin 3 (three) times daily. Inject 55 units every morning, 45 units in the afternoon and 55 units at bedtime     JALYN 0.5-0.4 MG Caps  Generic drug:  Dutasteride-Tamsulosin HCl  Take 1 capsule by mouth at bedtime.     metFORMIN 500 MG tablet  Commonly known as:  GLUCOPHAGE  Take 1,000 mg by mouth 2 (two) times daily with a meal.     metoprolol 50 MG tablet  Commonly known as:  LOPRESSOR  Take 50 mg by mouth 2 (two) times daily.     ramipril 10 MG capsule  Commonly known as:  ALTACE  Take 10 mg by mouth 2 (two) times daily.     traMADol 50 MG tablet  Commonly known as:  ULTRAM  Take 1-2 tablets (50-100 mg total) by mouth every 6 (six) hours as needed for pain.         Discharge Instructions:  The patient is to refrain from driving, heavy lifting or strenuous activity.  May shower daily and clean incisions with soap and water.  May resume regular diet.   Follow Up:      Follow-up Information   Follow up with Tejuan Gholson B, MD In 2 weeks. (Office will contact you with an appointment)    Specialty:  Cardiothoracic Surgery   Contact information:   [address] [place] [address] [phone]       Schedule an appointment as soon as possible for a visit with HODGES,FRANCISCO, MD. (for follow up of your diabetes)    Specialty:  Family Medicine        [name],[name] 06/08/2013, 8:26 AM

## 2013-06-10 ENCOUNTER — Telehealth: Payer: Self-pay | Admitting: Medical Oncology

## 2013-06-10 ENCOUNTER — Encounter: Payer: Self-pay | Admitting: Medical Oncology

## 2013-06-10 NOTE — Telephone Encounter (Signed)
Path report

## 2013-06-15 ENCOUNTER — Encounter: Payer: Self-pay | Admitting: *Deleted

## 2013-06-15 NOTE — Addendum Note (Signed)
Addendum created 06/15/13 0857 by Raiford Simmonds, MD   Modules edited: Anesthesia Attestations

## 2013-06-15 NOTE — Progress Notes (Signed)
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  Fax received 06/15/13

## 2013-06-16 ENCOUNTER — Other Ambulatory Visit: Payer: Self-pay | Admitting: *Deleted

## 2013-06-16 DIAGNOSIS — R918 Other nonspecific abnormal finding of lung field: Secondary | ICD-10-CM

## 2013-06-17 ENCOUNTER — Encounter: Payer: Self-pay | Admitting: Cardiothoracic Surgery

## 2013-06-17 NOTE — Telephone Encounter (Signed)
erroneous

## 2013-06-22 ENCOUNTER — Ambulatory Visit: Payer: Medicare Other

## 2013-06-25 ENCOUNTER — Ambulatory Visit (INDEPENDENT_AMBULATORY_CARE_PROVIDER_SITE_OTHER): Payer: Self-pay | Admitting: Cardiothoracic Surgery

## 2013-06-25 ENCOUNTER — Encounter: Payer: Self-pay | Admitting: Cardiothoracic Surgery

## 2013-06-25 ENCOUNTER — Ambulatory Visit
Admission: RE | Admit: 2013-06-25 | Discharge: 2013-06-25 | Disposition: A | Discharge status: Home / Self Care | Payer: Medicare Other | Source: Ambulatory Visit | Attending: Cardiothoracic Surgery | Admitting: Cardiothoracic Surgery

## 2013-06-25 DIAGNOSIS — R918 Other nonspecific abnormal finding of lung field: Secondary | ICD-10-CM

## 2013-06-25 DIAGNOSIS — J984 Other disorders of lung: Secondary | ICD-10-CM | POA: Diagnosis not present

## 2013-06-25 DIAGNOSIS — Z09 Encounter for follow-up examination after completed treatment for conditions other than malignant neoplasm: Secondary | ICD-10-CM

## 2013-06-25 DIAGNOSIS — C3410 Malignant neoplasm of upper lobe, unspecified bronchus or lung: Secondary | ICD-10-CM

## 2013-06-25 NOTE — Progress Notes (Signed)
[address]       [place] [address]             [phone]                  Garek Schuneman [facility] Medical Record #[id number] Date of Birth: [birth year]  Marcellus Scott, MD Charlott Rakes, MD  Chief Complaint:   PostOp Follow Up Visit 06/02/2013  PREOPERATIVE DIAGNOSIS: Left upper lobe adenocarcinoma of the lung.  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  SURGICAL PROCEDURE: Bronchoscopy left video-assisted thoracoscopy, mini  thoracotomy, left upper lobe resection with lymph node dissection, and  placement of On-Q device.  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 The L858R is a missense mutation in exon 21 that is associated with response of NSCLC to gefitinib or erlotinib monotherapy. ( Diagnosis 1. Lung, resection (segmental or lobe), Left Upper - ADENOCARCINOMA, WELL DIFFERENTIATED, SPANNING 3.5 CM. - THE SURGICAL RESECTION MARGINS ARE NEGATIVE FOR ADENOCARCINOMA. - THERE IS NO EVIDENCE OF CARCINOMA IN 4 OF 4 LYMPH NODES (0/4). - SEE ONCOLOGY TABLE BELOW. 2. Lymph node, biopsy, Lung, 10L - THERE IS NO EVIDENCE OF CARCINOMA IN 1 OF 1 LYMPH NODE (0/1). 3. Lymph node, biopsy, Lung, 4L - THERE IS NO EVIDENCE OF CARCINOMA IN 1 OF 1 LYMPH NODE (0/1). Microscopic Comment 1. LUNG 1 of 3 FINAL for HUE, FRICK ([id number]) Microscopic Comment(continued) Specimen, including laterality: Left upper lobe. Procedure: Lobectomy. Specimen integrity (intact/disrupted): Intact. Tumor site: Subpleural. Tumor focality: Unifocal. Maximum tumor size (cm): 3.5 cm (gross measurement). Histologic type: Adenocarcinoma, see comment. Grade: Well differentiated. Margins: Negative for adenocarcinoma. Distance to closest margin (cm): 3.0 cm to the bronchial resection margin (gross measurement). Visceral pleura invasion: Not identified. Tumor extension: Confined to lung  parenchyma. Treatment effect (if treated with neoadjuvant therapy): N/A. Lymph -Vascular invasion: Not identified. Lymph nodes: Number examined - 6; Number N1 nodes positive 0; Number N2 nodes positive 0 TNM code: pT2, pN0 Ancillary studies: A block will be sent for EGFR and ALK testing, per clinician request, and the results reported separately. Non-neoplastic lung: No significant findings. Comments: Histologic examination of the 3.5 cm mass reveals well differentiated adenocarcinoma with predominantly a lepidic (so-called in situ) growth pattern. However, there are scattered foci consistent with early stromal invasion. (JBK:ecj 06/03/2013)  History of Present Illness:      Patient returns to the office today in followup after recent left upper lobectomy with node dissection for adenocarcinoma of the lung. He's made good progress at home. He is taking no pain medication. Is anxious to return to driving.     History  Smoking status  . Never Smoker   Smokeless tobacco  . Not on file       No Known Allergies  Current Outpatient Prescriptions  Medication Sig Dispense Refill  . acetaminophen (TYLENOL) 325 MG tablet Take 325 mg by mouth daily as needed for pain.      Marland Kitchen albuterol (PROVENTIL HFA;VENTOLIN HFA) 108 (90 BASE) MCG/ACT inhaler Inhale 2 puffs into the lungs every 6 (six) hours as needed for wheezing or shortness of breath.       Marland Kitchen aspirin EC 81 MG tablet Take 81 mg by mouth daily.      Marland Kitchen atorvastatin (LIPITOR) 40 MG tablet Take 40 mg by mouth at bedtime.       . Dutasteride-Tamsulosin HCl (JALYN) 0.5-0.4 MG CAPS  Take 1 capsule by mouth at bedtime.      . hydrochlorothiazide (HYDRODIURIL) 25 MG tablet Take 25 mg by mouth daily.       . insulin NPH-regular (NOVOLIN 70/30) (70-30) 100 UNIT/ML injection Inject 45-55 Units into the skin 3 (three) times daily. Inject 55 units every morning, 45 units in the afternoon and 55 units at bedtime      . metFORMIN (GLUCOPHAGE) 500 MG  tablet Take 1,000 mg by mouth 2 (two) times daily with a meal.      . metoprolol (LOPRESSOR) 50 MG tablet Take 50 mg by mouth 2 (two) times daily.      . ramipril (ALTACE) 10 MG capsule Take 10 mg by mouth 2 (two) times daily.       No current facility-administered medications for this visit.       Physical Exam: BP 148/82  Pulse 67  Resp 16  Ht 5\' 8"  (1.727 m)  Wt 196 lb (88.905 kg)  BMI 29.81 kg/m2  SpO2 98%  General appearance: alert and cooperative Neurologic: intact Heart: regular rate and rhythm, S1, S2 normal, no murmur, click, rub or gallop and normal apical impulse Lungs: clear to auscultation bilaterally and normal percussion bilaterally Abdomen: soft, non-tender; bowel sounds normal; no masses,  no organomegaly Extremities: extremities normal, atraumatic, no cyanosis or edema and Homans sign is negative, no sign of DVT Wound: Left port sites an incision are healing well without sign of infection   Diagnostic Studies & Laboratory data:         Recent Radiology Findings: Dg Chest 2 View  06/25/2013   CLINICAL DATA:  Post left upper lobectomy for resection of adenocarcinoma, followup  EXAM: CHEST  2 VIEW  COMPARISON:  Chest x-ray of 06/08/2013  FINDINGS: The anterior left hydro pneumothorax is no longer seen with scarring in the left upper lobe after left upper lobectomy. No effusion is noted. Postoperative change at the left lung base is stable. The right lung is clear. Mild cardiomegaly is stable.  IMPRESSION: Resolution of left hydro pneumothorax. Postop scarring at the left lung base.   Electronically Signed   By: Dwyane Dee M.D.   On: 06/25/2013 15:20      Recent Labs: Lab Results  Component Value Date   WBC 9.7 06/06/2013   HGB 11.0* 06/06/2013   HCT 32.4* 06/06/2013   PLT 225 06/06/2013   GLUCOSE 92 06/08/2013   ALT 31 06/04/2013   AST 108* 06/04/2013   NA 131* 06/08/2013   K 3.1* 06/08/2013   CL 96 06/08/2013   CREATININE 0.83 06/08/2013   BUN 12  06/08/2013   CO2 26 06/08/2013   INR 1.04 05/29/2013   HGBA1C 8.9* 06/05/2013      Assessment / Plan:     Patient doing well postoperatively after left upper lobectomy for stage IB adenocarcinoma of the lung EGRF  Positive I discussed with the patient [employer] mRNA expression testing he is agreeable with this. He has also been referred to Dr Arbutus Ped / Medical Oncology I will see back in one month with chest xray     Nakota Elsen B 06/25/2013 4:15 PM

## 2013-06-25 NOTE — Patient Instructions (Signed)
Stage IB Lung Cancer  ( pT2a, pN0,cM0) EGFR mutation detected in exon 21. The L858R is a missense mutation in exon 21 that is associated with response of NSCLC to gefitinib or erlotinib monotherapy

## 2013-06-29 DIAGNOSIS — Z23 Encounter for immunization: Secondary | ICD-10-CM | POA: Diagnosis not present

## 2013-06-29 DIAGNOSIS — E782 Mixed hyperlipidemia: Secondary | ICD-10-CM | POA: Diagnosis not present

## 2013-06-29 DIAGNOSIS — I2510 Atherosclerotic heart disease of native coronary artery without angina pectoris: Secondary | ICD-10-CM | POA: Diagnosis not present

## 2013-06-29 DIAGNOSIS — C3430 Malignant neoplasm of lower lobe, unspecified bronchus or lung: Secondary | ICD-10-CM | POA: Diagnosis not present

## 2013-06-30 DIAGNOSIS — C3490 Malignant neoplasm of unspecified part of unspecified bronchus or lung: Secondary | ICD-10-CM | POA: Diagnosis not present

## 2013-07-15 DIAGNOSIS — C3490 Malignant neoplasm of unspecified part of unspecified bronchus or lung: Secondary | ICD-10-CM | POA: Diagnosis not present

## 2013-07-21 DIAGNOSIS — C3490 Malignant neoplasm of unspecified part of unspecified bronchus or lung: Secondary | ICD-10-CM | POA: Diagnosis not present

## 2013-07-21 DIAGNOSIS — Z09 Encounter for follow-up examination after completed treatment for conditions other than malignant neoplasm: Secondary | ICD-10-CM | POA: Diagnosis not present

## 2013-07-30 ENCOUNTER — Ambulatory Visit (INDEPENDENT_AMBULATORY_CARE_PROVIDER_SITE_OTHER): Payer: Self-pay | Admitting: Cardiothoracic Surgery

## 2013-07-30 ENCOUNTER — Encounter: Payer: Self-pay | Admitting: Cardiothoracic Surgery

## 2013-07-30 ENCOUNTER — Other Ambulatory Visit: Payer: Self-pay | Admitting: Cardiothoracic Surgery

## 2013-07-30 ENCOUNTER — Ambulatory Visit
Admission: RE | Admit: 2013-07-30 | Discharge: 2013-07-30 | Disposition: A | Discharge status: Home / Self Care | Payer: Medicare Other | Source: Ambulatory Visit | Attending: Cardiothoracic Surgery | Admitting: Cardiothoracic Surgery

## 2013-07-30 ENCOUNTER — Other Ambulatory Visit: Payer: Self-pay | Admitting: *Deleted

## 2013-07-30 DIAGNOSIS — Z902 Acquired absence of lung [part of]: Secondary | ICD-10-CM

## 2013-07-30 DIAGNOSIS — C801 Malignant (primary) neoplasm, unspecified: Secondary | ICD-10-CM

## 2013-07-30 DIAGNOSIS — I2510 Atherosclerotic heart disease of native coronary artery without angina pectoris: Secondary | ICD-10-CM

## 2013-07-30 DIAGNOSIS — J90 Pleural effusion, not elsewhere classified: Secondary | ICD-10-CM | POA: Diagnosis not present

## 2013-07-30 DIAGNOSIS — Z951 Presence of aortocoronary bypass graft: Secondary | ICD-10-CM

## 2013-07-30 DIAGNOSIS — Z09 Encounter for follow-up examination after completed treatment for conditions other than malignant neoplasm: Secondary | ICD-10-CM

## 2013-07-30 DIAGNOSIS — C3410 Malignant neoplasm of upper lobe, unspecified bronchus or lung: Secondary | ICD-10-CM

## 2013-07-30 DIAGNOSIS — Z9889 Other specified postprocedural states: Secondary | ICD-10-CM

## 2013-07-30 NOTE — Progress Notes (Signed)
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  SURGICAL PROCEDURE: Bronchoscopy left video-assisted thoracoscopy, mini  thoracotomy, left upper lobe resection with lymph node dissection, and  placement of On-Q device.  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 The L858R is a missense mutation in exon 21 that is associated with response of NSCLC to gefitinib or erlotinib monotherapy. ( 14)  History of Present Illness:      Patient returns to the office today in followup after recent left upper lobectomy with node dissection for adenocarcinoma of the lung, on October 7. He's made good progress at home. He is taking no pain medication. He's been seen by medical oncology in [place] and no further treatment other than observation has been recommended.      History  Smoking status  . Never Smoker   Smokeless tobacco  . Not on file       No Known Allergies  Current Outpatient Prescriptions  Medication Sig Dispense Refill  . acetaminophen (TYLENOL) 325 MG tablet Take 325 mg by mouth daily as needed for pain.      Marland Kitchen albuterol (PROVENTIL HFA;VENTOLIN HFA) 108 (90 BASE) MCG/ACT inhaler Inhale 2 puffs into the lungs every 6 (six) hours as needed for wheezing or shortness of breath.       Marland Kitchen aspirin EC 81 MG tablet Take 81 mg by mouth daily.      Marland Kitchen atorvastatin (LIPITOR) 40 MG tablet Take 40 mg by mouth at bedtime.       . Dutasteride-Tamsulosin HCl (JALYN) 0.5-0.4 MG CAPS Take 1 capsule by mouth at bedtime.      . hydrochlorothiazide (HYDRODIURIL) 25 MG  tablet Take 25 mg by mouth daily.       . insulin NPH-regular (NOVOLIN 70/30) (70-30) 100 UNIT/ML injection Inject 45-55 Units into the skin 3 (three) times daily. Inject 55 units every morning, 45 units in the afternoon and 55 units at bedtime      . metFORMIN (GLUCOPHAGE) 500 MG tablet Take 1,000 mg by mouth 2 (two) times daily with a meal.      . metoprolol (LOPRESSOR) 50 MG tablet Take 50 mg by mouth 2 (two) times daily.      . ramipril (ALTACE) 10 MG capsule Take 10 mg by mouth 2 (two) times daily.       No current facility-administered medications for this visit.       Physical Exam: BP 112/66  Pulse 68  Resp 20  Ht 5\' 8"  (1.727 m)  Wt 199 lb (90.266 kg)  BMI 30.26 kg/m2  SpO2 98%  General appearance: alert and cooperative Neurologic: intact Heart: regular rate and rhythm, S1, S2 normal, no murmur, click, rub or gallop and normal apical impulse Lungs: clear to auscultation bilaterally and normal percussion bilaterally Abdomen: soft, non-tender; bowel sounds normal; no masses,  no organomegaly Extremities: extremities normal, atraumatic, no cyanosis or edema and Homans sign is negative, no  sign of DVT Wound: Left port sites an incision are healing well without sign of infection   Diagnostic Studies & Laboratory data:         Recent Radiology Findings: Dg Chest 2 View  07/30/2013   CLINICAL DATA:  Status post wedge resection on October 2014.  EXAM: CHEST  2 VIEW  COMPARISON:  June 25, 2013  FINDINGS: The patient is status post prior median sternotomy. Surgical clips are projected over the left heart and left hilum. There is a small left pleural effusion. There is no focal pneumonia or pulmonary edema. There is no pneumothorax. The heart size is upper limits normal to mildly enlarged. The soft tissues and osseous structures are stable.  IMPRESSION: Postsurgical changes.  Small left pleural effusion.   Electronically Signed   By: Sherian Rein M.D.   On: 07/30/2013 14:16        Recent Labs: Lab Results  Component Value Date   WBC 9.7 06/06/2013   HGB 11.0* 06/06/2013   HCT 32.4* 06/06/2013   PLT 225 06/06/2013   GLUCOSE 92 06/08/2013   ALT 31 06/04/2013   AST 108* 06/04/2013   NA 131* 06/08/2013   K 3.1* 06/08/2013   CL 96 06/08/2013   CREATININE 0.83 06/08/2013   BUN 12 06/08/2013   CO2 26 06/08/2013   INR 1.04 05/29/2013   HGBA1C 8.9* 06/05/2013      Assessment / Plan:   Patient doing well postoperatively after left upper lobectomy for stage IB adenocarcinoma of the lung EGRF  Positive Patient now 2 months postoperatively doing well and returned to near-normal activities. He notes his glucose has been much better controlled recently I plan to see him back in early March, he notes that Dr. Gilman Buttner has arranged for a CT scan of the chest in late February.   Lydiana Milley B 07/30/2013 2:48 PM

## 2013-09-14 DIAGNOSIS — Z6831 Body mass index (BMI) 31.0-31.9, adult: Secondary | ICD-10-CM | POA: Diagnosis not present

## 2013-09-14 DIAGNOSIS — B86 Scabies: Secondary | ICD-10-CM | POA: Diagnosis not present

## 2013-09-14 DIAGNOSIS — Z1389 Encounter for screening for other disorder: Secondary | ICD-10-CM | POA: Diagnosis not present

## 2013-10-28 ENCOUNTER — Other Ambulatory Visit: Payer: Self-pay | Admitting: *Deleted

## 2013-10-28 DIAGNOSIS — C3490 Malignant neoplasm of unspecified part of unspecified bronchus or lung: Secondary | ICD-10-CM

## 2013-10-28 DIAGNOSIS — Z85118 Personal history of other malignant neoplasm of bronchus and lung: Secondary | ICD-10-CM | POA: Diagnosis not present

## 2013-10-29 ENCOUNTER — Ambulatory Visit: Payer: Medicare Other | Admitting: Cardiothoracic Surgery

## 2013-11-04 ENCOUNTER — Other Ambulatory Visit: Payer: Self-pay | Admitting: *Deleted

## 2013-11-04 DIAGNOSIS — C3490 Malignant neoplasm of unspecified part of unspecified bronchus or lung: Secondary | ICD-10-CM

## 2013-11-12 ENCOUNTER — Ambulatory Visit (INDEPENDENT_AMBULATORY_CARE_PROVIDER_SITE_OTHER): Payer: Medicare Other | Admitting: Cardiothoracic Surgery

## 2013-11-12 ENCOUNTER — Encounter: Payer: Self-pay | Admitting: Cardiothoracic Surgery

## 2013-11-12 ENCOUNTER — Ambulatory Visit
Admission: RE | Admit: 2013-11-12 | Discharge: 2013-11-12 | Disposition: A | Discharge status: Home / Self Care | Payer: Medicare Other | Source: Ambulatory Visit | Attending: Cardiothoracic Surgery | Admitting: Cardiothoracic Surgery

## 2013-11-12 VITALS — BP 126/72 | HR 65 | Resp 16 | Ht 68.0 in | Wt 201.0 lb

## 2013-11-12 DIAGNOSIS — C3490 Malignant neoplasm of unspecified part of unspecified bronchus or lung: Secondary | ICD-10-CM

## 2013-11-12 DIAGNOSIS — Z9889 Other specified postprocedural states: Secondary | ICD-10-CM | POA: Diagnosis not present

## 2013-11-12 DIAGNOSIS — C3410 Malignant neoplasm of upper lobe, unspecified bronchus or lung: Secondary | ICD-10-CM | POA: Diagnosis not present

## 2013-11-12 DIAGNOSIS — Z902 Acquired absence of lung [part of]: Secondary | ICD-10-CM

## 2013-11-12 DIAGNOSIS — R918 Other nonspecific abnormal finding of lung field: Secondary | ICD-10-CM | POA: Diagnosis not present

## 2013-11-12 NOTE — Progress Notes (Signed)
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  SURGICAL PROCEDURE: Bronchoscopy left video-assisted thoracoscopy, mini  thoracotomy, left upper lobe resection with lymph node dissection, and  placement of On-Q device.  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 The L858R is a missense mutation in exon 21 that is associated with response of NSCLC to gefitinib or erlotinib monotherapy. (also note Mirad results)  History of Present Illness:      Patient returns to the office today in followup after recent left upper lobectomy with node dissection for adenocarcinoma of the lung, on October 7. He's made good progress at home. He is taking no pain medication. He's been seen by medical oncology in [place], Dr Hinton Rao and no further treatment other than observation has been recommended.   Zubrod Score: At the time of surgery this patient's most appropriate activity status/level should be described as: $RemoveBefor'[x]'kcDowixOQYkh$     0    Normal activity, no symptoms $RemoveBef'[]'jdqrLwsbtO$     1    Restricted in physical strenuous activity but ambulatory, able to do out light work $RemoveBe'[]'XiHIAHcfY$     2    Ambulatory and capable of self care, unable to do work activities, up and about >50 % of waking hours                              '[]'$     3    Only limited self care, in bed greater than 50% of waking hours $RemoveBefo'[]'BukbflrNoba$     4    Completely disabled, no self care, confined to bed or chair $Remove'[]'cwDSZNG$     5    Moribund   History  Smoking status  . Never Smoker   Smokeless tobacco  . Not on file       No Known  Allergies  Current Outpatient Prescriptions  Medication Sig Dispense Refill  . acetaminophen (TYLENOL) 325 MG tablet Take 325 mg by mouth daily as needed for pain.      Marland Kitchen albuterol (PROVENTIL HFA;VENTOLIN HFA) 108 (90 BASE) MCG/ACT inhaler Inhale 2 puffs into the lungs every 6 (six) hours as needed for wheezing or shortness of breath.       Marland Kitchen aspirin EC 81 MG tablet Take 81 mg by mouth daily.      Marland Kitchen atorvastatin (LIPITOR) 40 MG tablet Take 40 mg by mouth at bedtime.       . Dutasteride-Tamsulosin HCl (JALYN) 0.5-0.4 MG CAPS Take 1 capsule by mouth at bedtime.      . hydrochlorothiazide (HYDRODIURIL) 25 MG tablet Take 25 mg by mouth daily.       . insulin NPH-regular (NOVOLIN 70/30) (70-30) 100 UNIT/ML injection Inject 45-55 Units into the skin 3 (three) times daily. Inject 55 units every morning, 45 units in the afternoon and 55 units at bedtime      . metFORMIN (GLUCOPHAGE) 500 MG  tablet Take 1,000 mg by mouth 2 (two) times daily with a meal.      . metoprolol (LOPRESSOR) 50 MG tablet Take 50 mg by mouth 2 (two) times daily.      . ramipril (ALTACE) 10 MG capsule Take 10 mg by mouth 2 (two) times daily.       No current facility-administered medications for this visit.       Physical Exam: BP 126/72  Pulse 65  Resp 16  Ht $R'5\' 8"'nB$  (1.727 m)  Wt 201 lb (91.173 kg)  BMI 30.57 kg/m2  SpO2 98%  General appearance: alert and cooperative Neurologic: intact Heart: regular rate and rhythm, S1, S2 normal, slight early ejection murmur , click, rub or gallop and normal apical impulse Lungs: clear to auscultation bilaterally and normal percussion bilaterally Abdomen: soft, non-tender; bowel sounds normal; no masses,  no organomegaly Extremities: extremities normal, atraumatic, no cyanosis or edema and Homans sign is negative, no sign of DVT Wound:  healing well without sign of infection Patient has no cervical or supraclavicular adenopathy, no axillary adenopathy He has no carotid bruits Has  palpable DP and PT pulses bilaterally  Diagnostic Studies & Laboratory data:         Recent Radiology Findings: Dg Chest 2 View  11/12/2013   CLINICAL DATA:  Post left upper lobectomy for resection of well differentiated adenocarcinoma of the left upper lobe, followup  EXAM: CHEST  2 VIEW  COMPARISON:  Chest x-ray of 07/30/2013  FINDINGS: Some volume loss throughout the left hemi thorax is stable after left upper lobectomy. The right lung is clear. No infiltrate or effusion is seen. Cardiomegaly is stable. 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 There are degenerative changes diffusely throughout the thoracic spine.  IMPRESSION: Stable postoperative changes on the left.   Electronically Signed   By: Ivar Drape M.D.   On: 11/12/2013 10:19      Recent Labs: Lab Results  Component Value Date   WBC 9.7 06/06/2013   HGB 11.0* 06/06/2013   HCT 32.4* 06/06/2013   PLT 225 06/06/2013   GLUCOSE 92 06/08/2013   ALT 31 06/04/2013   AST 108* 06/04/2013   NA 131* 06/08/2013   K 3.1* 06/08/2013   CL 96 06/08/2013   CREATININE 0.83 06/08/2013   BUN 12 06/08/2013   CO2 26 06/08/2013   INR 1.04 05/29/2013   HGBA1C 8.9* 06/05/2013      Assessment / Plan:   Patient doing well postoperatively after left upper lobectomy for stage IB adenocarcinoma of the lung EGRF  Positive Patient now 5 months postoperatively doing well and returned to near-normal activities. He notes his glucose has been much better controlled recently  Dr. Hinton Rao has arranged for a CT scan of the chest in May and send me the followup results. Plan to see the patient back in 6 months or sooner if Dr. Remi Deter request if needed.   Kaylyne Axton B 11/12/2013 10:46 AM

## 2013-11-26 DIAGNOSIS — Z125 Encounter for screening for malignant neoplasm of prostate: Secondary | ICD-10-CM | POA: Diagnosis not present

## 2013-11-26 DIAGNOSIS — I10 Essential (primary) hypertension: Secondary | ICD-10-CM | POA: Diagnosis not present

## 2013-11-26 DIAGNOSIS — E782 Mixed hyperlipidemia: Secondary | ICD-10-CM | POA: Diagnosis not present

## 2013-11-26 DIAGNOSIS — IMO0001 Reserved for inherently not codable concepts without codable children: Secondary | ICD-10-CM | POA: Diagnosis not present

## 2013-12-23 DIAGNOSIS — IMO0002 Reserved for concepts with insufficient information to code with codable children: Secondary | ICD-10-CM | POA: Diagnosis not present

## 2013-12-23 DIAGNOSIS — E11311 Type 2 diabetes mellitus with unspecified diabetic retinopathy with macular edema: Secondary | ICD-10-CM | POA: Diagnosis not present

## 2013-12-23 DIAGNOSIS — E11329 Type 2 diabetes mellitus with mild nonproliferative diabetic retinopathy without macular edema: Secondary | ICD-10-CM | POA: Diagnosis not present

## 2013-12-23 DIAGNOSIS — E1139 Type 2 diabetes mellitus with other diabetic ophthalmic complication: Secondary | ICD-10-CM | POA: Diagnosis not present

## 2013-12-29 DIAGNOSIS — IMO0001 Reserved for inherently not codable concepts without codable children: Secondary | ICD-10-CM | POA: Diagnosis not present

## 2013-12-29 DIAGNOSIS — E782 Mixed hyperlipidemia: Secondary | ICD-10-CM | POA: Diagnosis not present

## 2013-12-29 DIAGNOSIS — I10 Essential (primary) hypertension: Secondary | ICD-10-CM | POA: Diagnosis not present

## 2014-01-05 DIAGNOSIS — C3490 Malignant neoplasm of unspecified part of unspecified bronchus or lung: Secondary | ICD-10-CM | POA: Diagnosis not present

## 2014-01-05 DIAGNOSIS — R911 Solitary pulmonary nodule: Secondary | ICD-10-CM | POA: Diagnosis not present

## 2014-01-07 DIAGNOSIS — Z09 Encounter for follow-up examination after completed treatment for conditions other than malignant neoplasm: Secondary | ICD-10-CM | POA: Diagnosis not present

## 2014-01-07 DIAGNOSIS — Z85118 Personal history of other malignant neoplasm of bronchus and lung: Secondary | ICD-10-CM | POA: Diagnosis not present

## 2014-01-13 DIAGNOSIS — Z6832 Body mass index (BMI) 32.0-32.9, adult: Secondary | ICD-10-CM | POA: Diagnosis not present

## 2014-01-13 DIAGNOSIS — IMO0001 Reserved for inherently not codable concepts without codable children: Secondary | ICD-10-CM | POA: Diagnosis not present

## 2014-03-30 DIAGNOSIS — H5710 Ocular pain, unspecified eye: Secondary | ICD-10-CM | POA: Diagnosis not present

## 2014-04-20 DIAGNOSIS — D649 Anemia, unspecified: Secondary | ICD-10-CM | POA: Diagnosis not present

## 2014-04-20 DIAGNOSIS — Z09 Encounter for follow-up examination after completed treatment for conditions other than malignant neoplasm: Secondary | ICD-10-CM | POA: Diagnosis not present

## 2014-04-20 DIAGNOSIS — Z85118 Personal history of other malignant neoplasm of bronchus and lung: Secondary | ICD-10-CM | POA: Diagnosis not present

## 2014-04-25 DIAGNOSIS — C3490 Malignant neoplasm of unspecified part of unspecified bronchus or lung: Secondary | ICD-10-CM | POA: Diagnosis not present

## 2014-04-25 DIAGNOSIS — E538 Deficiency of other specified B group vitamins: Secondary | ICD-10-CM | POA: Diagnosis not present

## 2014-04-25 DIAGNOSIS — D649 Anemia, unspecified: Secondary | ICD-10-CM | POA: Diagnosis not present

## 2014-04-26 DIAGNOSIS — E538 Deficiency of other specified B group vitamins: Secondary | ICD-10-CM | POA: Diagnosis not present

## 2014-04-26 DIAGNOSIS — C3490 Malignant neoplasm of unspecified part of unspecified bronchus or lung: Secondary | ICD-10-CM | POA: Diagnosis not present

## 2014-04-26 DIAGNOSIS — D649 Anemia, unspecified: Secondary | ICD-10-CM | POA: Diagnosis not present

## 2014-04-27 DIAGNOSIS — C3490 Malignant neoplasm of unspecified part of unspecified bronchus or lung: Secondary | ICD-10-CM | POA: Diagnosis not present

## 2014-04-27 DIAGNOSIS — E538 Deficiency of other specified B group vitamins: Secondary | ICD-10-CM | POA: Diagnosis not present

## 2014-05-11 DIAGNOSIS — E538 Deficiency of other specified B group vitamins: Secondary | ICD-10-CM | POA: Diagnosis not present

## 2014-05-11 DIAGNOSIS — C3490 Malignant neoplasm of unspecified part of unspecified bronchus or lung: Secondary | ICD-10-CM | POA: Diagnosis not present

## 2014-05-18 DIAGNOSIS — E538 Deficiency of other specified B group vitamins: Secondary | ICD-10-CM | POA: Diagnosis not present

## 2014-05-20 ENCOUNTER — Ambulatory Visit: Payer: Medicare Other | Admitting: Cardiothoracic Surgery

## 2014-05-25 ENCOUNTER — Ambulatory Visit: Payer: Medicare Other | Admitting: Cardiothoracic Surgery

## 2014-05-25 DIAGNOSIS — E538 Deficiency of other specified B group vitamins: Secondary | ICD-10-CM | POA: Diagnosis not present

## 2014-05-25 DIAGNOSIS — C3490 Malignant neoplasm of unspecified part of unspecified bronchus or lung: Secondary | ICD-10-CM | POA: Diagnosis not present

## 2014-06-01 DIAGNOSIS — H25811 Combined forms of age-related cataract, right eye: Secondary | ICD-10-CM | POA: Diagnosis not present

## 2014-06-01 DIAGNOSIS — E11329 Type 2 diabetes mellitus with mild nonproliferative diabetic retinopathy without macular edema: Secondary | ICD-10-CM | POA: Diagnosis not present

## 2014-06-03 ENCOUNTER — Encounter: Payer: Self-pay | Admitting: Cardiothoracic Surgery

## 2014-06-03 ENCOUNTER — Ambulatory Visit (INDEPENDENT_AMBULATORY_CARE_PROVIDER_SITE_OTHER): Payer: Medicare Other | Admitting: Cardiothoracic Surgery

## 2014-06-03 ENCOUNTER — Ambulatory Visit
Admission: RE | Admit: 2014-06-03 | Discharge: 2014-06-03 | Disposition: A | Discharge status: Home / Self Care | Payer: Medicare Other | Source: Ambulatory Visit | Attending: Cardiothoracic Surgery | Admitting: Cardiothoracic Surgery

## 2014-06-03 VITALS — BP 133/76 | HR 67 | Ht 68.0 in | Wt 201.0 lb

## 2014-06-03 DIAGNOSIS — C3492 Malignant neoplasm of unspecified part of left bronchus or lung: Secondary | ICD-10-CM | POA: Diagnosis not present

## 2014-06-03 DIAGNOSIS — Z951 Presence of aortocoronary bypass graft: Secondary | ICD-10-CM | POA: Diagnosis not present

## 2014-06-03 DIAGNOSIS — Z85118 Personal history of other malignant neoplasm of bronchus and lung: Secondary | ICD-10-CM | POA: Diagnosis not present

## 2014-06-03 DIAGNOSIS — I517 Cardiomegaly: Secondary | ICD-10-CM | POA: Diagnosis not present

## 2014-06-03 DIAGNOSIS — C3490 Malignant neoplasm of unspecified part of unspecified bronchus or lung: Secondary | ICD-10-CM

## 2014-06-03 NOTE — Progress Notes (Signed)
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  SURGICAL PROCEDURE: Bronchoscopy left video-assisted thoracoscopy, mini  thoracotomy, left upper lobe resection with lymph node dissection, and  placement of On-Q device.  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 The L858R is a missense mutation in exon 21 that is associated with response of NSCLC to gefitinib or erlotinib monotherapy. (also note Mirad results)  History of Present Illness:      Patient returns to the office today in followup after left upper lobectomy with node dissection for adenocarcinoma of the lung, on June 02 2013 now one year post op. He's been seen by medical oncology in [place], Dr Hinton Rao and no further treatment other than observation has been recommended. Since seen 6 months ago he has done well with out limitations in activity.   Zubrod Score: At the time of surgery this patient's most appropriate activity status/level should be described as: _0     0    Normal activity, no symptoms _1     1    Restricted in physical strenuous activity but ambulatory, able to do out light work _2     2    Ambulatory and capable of self care, unable to do work activities, up and about >50 % of waking hours                              _3     3    Only limited self care, in bed greater than 50% of waking hours _4     4    Completely disabled, no self care, confined to bed or chair _5     5    Moribund   History  Smoking status  . Never Smoker   Smokeless tobacco  . Not on file         No Known Allergies  Current Outpatient Prescriptions  Medication Sig Dispense Refill  . aspirin EC 81 MG tablet Take 81 mg by mouth daily.      Marland Kitchen atorvastatin (LIPITOR) 40 MG tablet Take 40 mg by mouth at bedtime.       . Dutasteride-Tamsulosin HCl (JALYN) 0.5-0.4 MG CAPS Take 1 capsule by mouth at bedtime.      . hydrochlorothiazide (HYDRODIURIL) 25 MG tablet Take 25 mg by mouth daily.       . insulin NPH-regular (NOVOLIN 70/30) (70-30) 100 UNIT/ML injection Inject 45-55 Units into the skin 3 (three) times daily. Inject 55 units every morning, 45 units in the afternoon and 55 units at bedtime      . metFORMIN (GLUCOPHAGE) 500 MG tablet Take 1,000 mg by mouth 2 (two) times daily with a meal.      . metoprolol (LOPRESSOR) 50 MG tablet Take 50 mg by mouth 2 (two) times daily.      . ramipril (ALTACE) 10 MG capsule Take 10 mg  by mouth 2 (two) times daily.      Marland Kitchen acetaminophen (TYLENOL) 325 MG tablet Take 325 mg by mouth daily as needed for pain.      Marland Kitchen albuterol (PROVENTIL HFA;VENTOLIN HFA) 108 (90 BASE) MCG/ACT inhaler Inhale 2 puffs into the lungs every 6 (six) hours as needed for wheezing or shortness of breath.        No current facility-administered medications for this visit.       Physical Exam: BP 133/76  Pulse 67  Ht _0  (1.727 m)  Wt 201 lb (91.173 kg)  BMI 30.57 kg/m2  SpO2 97%  General appearance: alert and cooperative Neurologic: intact Heart: regular rate and rhythm, S1, S2 normal, slight early ejection murmur , click, rub or gallop and normal apical impulse Lungs: clear to auscultation bilaterally and normal percussion bilaterally Abdomen: soft, non-tender; bowel sounds normal; no masses,  no organomegaly Extremities: extremities normal, atraumatic, no cyanosis or edema and Homans sign is negative, no sign of DVT Wound:  healing well without sign of infection Patient has no cervical or supraclavicular adenopathy, no axillary adenopathy He has no carotid  bruits Has palpable DP and PT pulses bilaterally  Diagnostic Studies & Laboratory data:         Recent Radiology Findings: Dg Chest 2 View  06/03/2014   CLINICAL DATA:  Prior chest surgery. History of lung cancer. Follow-up evaluation.  EXAM: CHEST  2 VIEW  COMPARISON:  CT 01/05/2014.  Chest x-ray 11/12/2013.  FINDINGS: Mediastinum and hilar structures normal. Stable cardiomegaly. Prior CABG. Pulmonary vascularity is normal. Stable pleural parenchymal thickening left lung base consistent with scarring. Postsurgical changes noted over left chest PA no acute bony abnormality. Degenerative changes thoracic spine.  IMPRESSION: 1.  Postsurgical changes left chest.  No acute pulmonary disease.  2.  Prior CABG.  Stable cardiomegaly, no CHF.   Electronically Signed   By: Marcello Moores  Register   On: 06/03/2014 13:24      Recent Labs: Lab Results  Component Value Date   WBC 9.7 06/06/2013   HGB 11.0* 06/06/2013   HCT 32.4* 06/06/2013   PLT 225 06/06/2013   GLUCOSE 92 06/08/2013   ALT 31 06/04/2013   AST 108* 06/04/2013   NA 131* 06/08/2013   K 3.1* 06/08/2013   CL 96 06/08/2013   CREATININE 0.83 06/08/2013   BUN 12 06/08/2013   CO2 26 06/08/2013   INR 1.04 05/29/2013   HGBA1C 8.9* 06/05/2013      Assessment / Plan:   Patient doing well postoperatively after left upper lobectomy for stage IB adenocarcinoma of the lung EGRF  Positive Patient now 12 months  postoperatively doing well and returned to near-normal activities. He notes his glucose has been much better controlled recently Dr. Hinton Rao has arranged for a CT scan of the chest in November 2015  and send me the followup results. Plan to see the patient back in 6 months or sooner if Dr. Remi Deter request if needed.   Tayton Decaire B 06/03/2014 2:19 PM

## 2014-06-04 ENCOUNTER — Ambulatory Visit: Payer: Medicare Other | Admitting: Cardiothoracic Surgery

## 2014-06-09 DIAGNOSIS — H25811 Combined forms of age-related cataract, right eye: Secondary | ICD-10-CM | POA: Diagnosis not present

## 2014-06-09 DIAGNOSIS — H2181 Floppy iris syndrome: Secondary | ICD-10-CM | POA: Diagnosis not present

## 2014-06-09 DIAGNOSIS — H2511 Age-related nuclear cataract, right eye: Secondary | ICD-10-CM | POA: Diagnosis not present

## 2014-06-23 DIAGNOSIS — R0600 Dyspnea, unspecified: Secondary | ICD-10-CM | POA: Diagnosis not present

## 2014-06-24 DIAGNOSIS — R918 Other nonspecific abnormal finding of lung field: Secondary | ICD-10-CM | POA: Diagnosis not present

## 2014-06-24 DIAGNOSIS — R599 Enlarged lymph nodes, unspecified: Secondary | ICD-10-CM | POA: Diagnosis not present

## 2014-06-24 DIAGNOSIS — J984 Other disorders of lung: Secondary | ICD-10-CM | POA: Diagnosis not present

## 2014-06-24 DIAGNOSIS — R0600 Dyspnea, unspecified: Secondary | ICD-10-CM | POA: Diagnosis not present

## 2014-06-24 DIAGNOSIS — J90 Pleural effusion, not elsewhere classified: Secondary | ICD-10-CM | POA: Diagnosis not present

## 2014-06-29 DIAGNOSIS — R0600 Dyspnea, unspecified: Secondary | ICD-10-CM | POA: Diagnosis not present

## 2014-06-29 DIAGNOSIS — Z23 Encounter for immunization: Secondary | ICD-10-CM | POA: Diagnosis not present

## 2014-07-14 DIAGNOSIS — H04123 Dry eye syndrome of bilateral lacrimal glands: Secondary | ICD-10-CM | POA: Diagnosis not present

## 2014-07-27 DIAGNOSIS — R590 Localized enlarged lymph nodes: Secondary | ICD-10-CM | POA: Diagnosis not present

## 2014-07-27 DIAGNOSIS — E538 Deficiency of other specified B group vitamins: Secondary | ICD-10-CM | POA: Diagnosis not present

## 2014-07-27 DIAGNOSIS — C3482 Malignant neoplasm of overlapping sites of left bronchus and lung: Secondary | ICD-10-CM | POA: Diagnosis not present

## 2014-08-02 DIAGNOSIS — E1129 Type 2 diabetes mellitus with other diabetic kidney complication: Secondary | ICD-10-CM | POA: Diagnosis not present

## 2014-08-02 DIAGNOSIS — E784 Other hyperlipidemia: Secondary | ICD-10-CM | POA: Diagnosis not present

## 2014-08-02 DIAGNOSIS — E1165 Type 2 diabetes mellitus with hyperglycemia: Secondary | ICD-10-CM | POA: Diagnosis not present

## 2014-08-02 DIAGNOSIS — I10 Essential (primary) hypertension: Secondary | ICD-10-CM | POA: Diagnosis not present

## 2014-08-30 DIAGNOSIS — I501 Left ventricular failure: Secondary | ICD-10-CM | POA: Diagnosis not present

## 2014-08-30 DIAGNOSIS — J449 Chronic obstructive pulmonary disease, unspecified: Secondary | ICD-10-CM | POA: Diagnosis not present

## 2014-08-30 DIAGNOSIS — C3412 Malignant neoplasm of upper lobe, left bronchus or lung: Secondary | ICD-10-CM | POA: Diagnosis not present

## 2014-08-31 DIAGNOSIS — Z6831 Body mass index (BMI) 31.0-31.9, adult: Secondary | ICD-10-CM | POA: Diagnosis not present

## 2014-08-31 DIAGNOSIS — R0602 Shortness of breath: Secondary | ICD-10-CM | POA: Diagnosis not present

## 2014-08-31 DIAGNOSIS — L309 Dermatitis, unspecified: Secondary | ICD-10-CM | POA: Diagnosis not present

## 2014-09-02 DIAGNOSIS — R0600 Dyspnea, unspecified: Secondary | ICD-10-CM | POA: Diagnosis not present

## 2014-09-02 DIAGNOSIS — I340 Nonrheumatic mitral (valve) insufficiency: Secondary | ICD-10-CM | POA: Diagnosis not present

## 2014-09-02 DIAGNOSIS — I361 Nonrheumatic tricuspid (valve) insufficiency: Secondary | ICD-10-CM | POA: Diagnosis not present

## 2014-09-02 DIAGNOSIS — R0602 Shortness of breath: Secondary | ICD-10-CM | POA: Diagnosis not present

## 2014-09-02 DIAGNOSIS — I351 Nonrheumatic aortic (valve) insufficiency: Secondary | ICD-10-CM | POA: Diagnosis not present

## 2014-09-08 DIAGNOSIS — I2510 Atherosclerotic heart disease of native coronary artery without angina pectoris: Secondary | ICD-10-CM | POA: Diagnosis not present

## 2014-09-08 DIAGNOSIS — E669 Obesity, unspecified: Secondary | ICD-10-CM | POA: Diagnosis not present

## 2014-09-08 DIAGNOSIS — E785 Hyperlipidemia, unspecified: Secondary | ICD-10-CM | POA: Diagnosis not present

## 2014-09-08 DIAGNOSIS — I429 Cardiomyopathy, unspecified: Secondary | ICD-10-CM | POA: Diagnosis not present

## 2014-09-08 DIAGNOSIS — I10 Essential (primary) hypertension: Secondary | ICD-10-CM | POA: Diagnosis not present

## 2014-09-08 DIAGNOSIS — R0602 Shortness of breath: Secondary | ICD-10-CM | POA: Diagnosis not present

## 2014-09-08 DIAGNOSIS — E119 Type 2 diabetes mellitus without complications: Secondary | ICD-10-CM | POA: Diagnosis not present

## 2014-09-09 DIAGNOSIS — I2510 Atherosclerotic heart disease of native coronary artery without angina pectoris: Secondary | ICD-10-CM | POA: Diagnosis not present

## 2014-09-09 DIAGNOSIS — I429 Cardiomyopathy, unspecified: Secondary | ICD-10-CM | POA: Diagnosis not present

## 2014-09-09 DIAGNOSIS — R0602 Shortness of breath: Secondary | ICD-10-CM | POA: Diagnosis not present

## 2014-09-15 DIAGNOSIS — I429 Cardiomyopathy, unspecified: Secondary | ICD-10-CM | POA: Diagnosis not present

## 2014-09-15 DIAGNOSIS — Z951 Presence of aortocoronary bypass graft: Secondary | ICD-10-CM | POA: Diagnosis not present

## 2014-09-15 DIAGNOSIS — I10 Essential (primary) hypertension: Secondary | ICD-10-CM | POA: Diagnosis not present

## 2014-09-15 DIAGNOSIS — I2510 Atherosclerotic heart disease of native coronary artery without angina pectoris: Secondary | ICD-10-CM | POA: Diagnosis not present

## 2014-09-15 DIAGNOSIS — E119 Type 2 diabetes mellitus without complications: Secondary | ICD-10-CM | POA: Diagnosis not present

## 2014-09-15 DIAGNOSIS — E669 Obesity, unspecified: Secondary | ICD-10-CM | POA: Diagnosis not present

## 2014-09-15 DIAGNOSIS — E785 Hyperlipidemia, unspecified: Secondary | ICD-10-CM | POA: Diagnosis not present

## 2014-09-16 DIAGNOSIS — C3412 Malignant neoplasm of upper lobe, left bronchus or lung: Secondary | ICD-10-CM | POA: Diagnosis not present

## 2014-09-16 DIAGNOSIS — I501 Left ventricular failure: Secondary | ICD-10-CM | POA: Diagnosis not present

## 2014-09-16 DIAGNOSIS — J449 Chronic obstructive pulmonary disease, unspecified: Secondary | ICD-10-CM | POA: Diagnosis not present

## 2014-09-21 DIAGNOSIS — L309 Dermatitis, unspecified: Secondary | ICD-10-CM | POA: Diagnosis not present

## 2014-09-21 DIAGNOSIS — Z6831 Body mass index (BMI) 31.0-31.9, adult: Secondary | ICD-10-CM | POA: Diagnosis not present

## 2014-10-06 DIAGNOSIS — L298 Other pruritus: Secondary | ICD-10-CM | POA: Diagnosis not present

## 2014-10-06 DIAGNOSIS — Z6831 Body mass index (BMI) 31.0-31.9, adult: Secondary | ICD-10-CM | POA: Diagnosis not present

## 2014-10-21 DIAGNOSIS — E11649 Type 2 diabetes mellitus with hypoglycemia without coma: Secondary | ICD-10-CM | POA: Diagnosis not present

## 2014-10-21 DIAGNOSIS — E161 Other hypoglycemia: Secondary | ICD-10-CM | POA: Diagnosis not present

## 2014-10-21 DIAGNOSIS — R531 Weakness: Secondary | ICD-10-CM | POA: Diagnosis not present

## 2014-10-21 DIAGNOSIS — I10 Essential (primary) hypertension: Secondary | ICD-10-CM | POA: Diagnosis not present

## 2014-10-21 DIAGNOSIS — E162 Hypoglycemia, unspecified: Secondary | ICD-10-CM | POA: Diagnosis not present

## 2014-10-21 DIAGNOSIS — R40243 Glasgow coma scale score 3-8: Secondary | ICD-10-CM | POA: Diagnosis not present

## 2014-10-21 DIAGNOSIS — E780 Pure hypercholesterolemia: Secondary | ICD-10-CM | POA: Diagnosis not present

## 2014-10-21 DIAGNOSIS — E860 Dehydration: Secondary | ICD-10-CM | POA: Diagnosis not present

## 2014-10-21 DIAGNOSIS — E13649 Other specified diabetes mellitus with hypoglycemia without coma: Secondary | ICD-10-CM | POA: Diagnosis not present

## 2014-10-25 DIAGNOSIS — E1169 Type 2 diabetes mellitus with other specified complication: Secondary | ICD-10-CM | POA: Diagnosis not present

## 2014-10-25 DIAGNOSIS — Z794 Long term (current) use of insulin: Secondary | ICD-10-CM | POA: Diagnosis not present

## 2014-10-25 DIAGNOSIS — I129 Hypertensive chronic kidney disease with stage 1 through stage 4 chronic kidney disease, or unspecified chronic kidney disease: Secondary | ICD-10-CM | POA: Diagnosis not present

## 2014-10-25 DIAGNOSIS — T383X5A Adverse effect of insulin and oral hypoglycemic [antidiabetic] drugs, initial encounter: Secondary | ICD-10-CM | POA: Diagnosis not present

## 2014-10-25 DIAGNOSIS — E160 Drug-induced hypoglycemia without coma: Secondary | ICD-10-CM | POA: Diagnosis not present

## 2014-10-25 DIAGNOSIS — E1159 Type 2 diabetes mellitus with other circulatory complications: Secondary | ICD-10-CM | POA: Diagnosis not present

## 2014-11-01 DIAGNOSIS — Z6831 Body mass index (BMI) 31.0-31.9, adult: Secondary | ICD-10-CM | POA: Diagnosis not present

## 2014-11-01 DIAGNOSIS — E1169 Type 2 diabetes mellitus with other specified complication: Secondary | ICD-10-CM | POA: Diagnosis not present

## 2014-11-01 DIAGNOSIS — Z794 Long term (current) use of insulin: Secondary | ICD-10-CM | POA: Diagnosis not present

## 2014-11-01 DIAGNOSIS — E1159 Type 2 diabetes mellitus with other circulatory complications: Secondary | ICD-10-CM | POA: Diagnosis not present

## 2014-11-16 ENCOUNTER — Other Ambulatory Visit: Payer: Self-pay | Admitting: *Deleted

## 2014-11-18 DIAGNOSIS — I10 Essential (primary) hypertension: Secondary | ICD-10-CM | POA: Diagnosis not present

## 2014-11-18 DIAGNOSIS — J189 Pneumonia, unspecified organism: Secondary | ICD-10-CM | POA: Diagnosis not present

## 2014-11-18 DIAGNOSIS — R0689 Other abnormalities of breathing: Secondary | ICD-10-CM | POA: Diagnosis not present

## 2014-11-18 DIAGNOSIS — E119 Type 2 diabetes mellitus without complications: Secondary | ICD-10-CM | POA: Diagnosis not present

## 2014-11-18 DIAGNOSIS — I509 Heart failure, unspecified: Secondary | ICD-10-CM | POA: Diagnosis not present

## 2014-11-18 DIAGNOSIS — R000 Tachycardia, unspecified: Secondary | ICD-10-CM | POA: Diagnosis not present

## 2014-11-18 DIAGNOSIS — E780 Pure hypercholesterolemia: Secondary | ICD-10-CM | POA: Diagnosis not present

## 2014-11-18 DIAGNOSIS — Z794 Long term (current) use of insulin: Secondary | ICD-10-CM | POA: Diagnosis not present

## 2014-11-18 DIAGNOSIS — R911 Solitary pulmonary nodule: Secondary | ICD-10-CM | POA: Diagnosis not present

## 2014-11-18 DIAGNOSIS — I517 Cardiomegaly: Secondary | ICD-10-CM | POA: Diagnosis not present

## 2014-11-26 DIAGNOSIS — I5022 Chronic systolic (congestive) heart failure: Secondary | ICD-10-CM | POA: Diagnosis not present

## 2014-11-26 DIAGNOSIS — J449 Chronic obstructive pulmonary disease, unspecified: Secondary | ICD-10-CM | POA: Diagnosis not present

## 2014-11-26 DIAGNOSIS — R911 Solitary pulmonary nodule: Secondary | ICD-10-CM | POA: Diagnosis not present

## 2014-11-26 DIAGNOSIS — J189 Pneumonia, unspecified organism: Secondary | ICD-10-CM | POA: Diagnosis not present

## 2014-12-01 DIAGNOSIS — E1165 Type 2 diabetes mellitus with hyperglycemia: Secondary | ICD-10-CM | POA: Diagnosis not present

## 2014-12-01 DIAGNOSIS — R208 Other disturbances of skin sensation: Secondary | ICD-10-CM | POA: Diagnosis not present

## 2014-12-01 DIAGNOSIS — G629 Polyneuropathy, unspecified: Secondary | ICD-10-CM | POA: Diagnosis not present

## 2014-12-03 DIAGNOSIS — E1165 Type 2 diabetes mellitus with hyperglycemia: Secondary | ICD-10-CM | POA: Diagnosis not present

## 2014-12-07 DIAGNOSIS — D518 Other vitamin B12 deficiency anemias: Secondary | ICD-10-CM | POA: Diagnosis not present

## 2014-12-07 DIAGNOSIS — C3482 Malignant neoplasm of overlapping sites of left bronchus and lung: Secondary | ICD-10-CM | POA: Diagnosis not present

## 2014-12-20 DIAGNOSIS — I509 Heart failure, unspecified: Secondary | ICD-10-CM | POA: Diagnosis not present

## 2014-12-20 DIAGNOSIS — E785 Hyperlipidemia, unspecified: Secondary | ICD-10-CM | POA: Diagnosis not present

## 2014-12-20 DIAGNOSIS — I10 Essential (primary) hypertension: Secondary | ICD-10-CM | POA: Diagnosis not present

## 2014-12-20 DIAGNOSIS — E1165 Type 2 diabetes mellitus with hyperglycemia: Secondary | ICD-10-CM | POA: Diagnosis not present

## 2014-12-22 DIAGNOSIS — I2510 Atherosclerotic heart disease of native coronary artery without angina pectoris: Secondary | ICD-10-CM | POA: Diagnosis not present

## 2014-12-22 DIAGNOSIS — I509 Heart failure, unspecified: Secondary | ICD-10-CM | POA: Diagnosis not present

## 2015-01-05 DIAGNOSIS — I509 Heart failure, unspecified: Secondary | ICD-10-CM | POA: Diagnosis not present

## 2015-01-05 DIAGNOSIS — E785 Hyperlipidemia, unspecified: Secondary | ICD-10-CM | POA: Diagnosis not present

## 2015-01-05 DIAGNOSIS — E1165 Type 2 diabetes mellitus with hyperglycemia: Secondary | ICD-10-CM | POA: Diagnosis not present

## 2015-01-05 DIAGNOSIS — I10 Essential (primary) hypertension: Secondary | ICD-10-CM | POA: Diagnosis not present

## 2015-01-12 DIAGNOSIS — E11329 Type 2 diabetes mellitus with mild nonproliferative diabetic retinopathy without macular edema: Secondary | ICD-10-CM | POA: Diagnosis not present

## 2015-01-19 DIAGNOSIS — I10 Essential (primary) hypertension: Secondary | ICD-10-CM | POA: Diagnosis not present

## 2015-01-19 DIAGNOSIS — I2510 Atherosclerotic heart disease of native coronary artery without angina pectoris: Secondary | ICD-10-CM | POA: Diagnosis not present

## 2015-01-19 DIAGNOSIS — E119 Type 2 diabetes mellitus without complications: Secondary | ICD-10-CM | POA: Diagnosis not present

## 2015-01-19 DIAGNOSIS — I429 Cardiomyopathy, unspecified: Secondary | ICD-10-CM | POA: Diagnosis not present

## 2015-01-19 DIAGNOSIS — E782 Mixed hyperlipidemia: Secondary | ICD-10-CM | POA: Diagnosis not present

## 2015-02-04 DIAGNOSIS — E1165 Type 2 diabetes mellitus with hyperglycemia: Secondary | ICD-10-CM | POA: Diagnosis not present

## 2015-02-04 DIAGNOSIS — R600 Localized edema: Secondary | ICD-10-CM | POA: Diagnosis not present

## 2015-02-04 DIAGNOSIS — E785 Hyperlipidemia, unspecified: Secondary | ICD-10-CM | POA: Diagnosis not present

## 2015-02-04 DIAGNOSIS — I509 Heart failure, unspecified: Secondary | ICD-10-CM | POA: Diagnosis not present

## 2015-02-07 DIAGNOSIS — I350 Nonrheumatic aortic (valve) stenosis: Secondary | ICD-10-CM | POA: Diagnosis not present

## 2015-02-07 DIAGNOSIS — I371 Nonrheumatic pulmonary valve insufficiency: Secondary | ICD-10-CM | POA: Diagnosis not present

## 2015-02-07 DIAGNOSIS — I361 Nonrheumatic tricuspid (valve) insufficiency: Secondary | ICD-10-CM | POA: Diagnosis not present

## 2015-02-07 DIAGNOSIS — I340 Nonrheumatic mitral (valve) insufficiency: Secondary | ICD-10-CM | POA: Diagnosis not present

## 2015-02-10 DIAGNOSIS — M79605 Pain in left leg: Secondary | ICD-10-CM | POA: Diagnosis not present

## 2015-02-10 DIAGNOSIS — M7989 Other specified soft tissue disorders: Secondary | ICD-10-CM | POA: Diagnosis not present

## 2015-02-10 DIAGNOSIS — M79604 Pain in right leg: Secondary | ICD-10-CM | POA: Diagnosis not present

## 2015-02-10 DIAGNOSIS — R600 Localized edema: Secondary | ICD-10-CM | POA: Diagnosis not present

## 2015-02-21 DIAGNOSIS — I10 Essential (primary) hypertension: Secondary | ICD-10-CM | POA: Diagnosis not present

## 2015-02-21 DIAGNOSIS — I509 Heart failure, unspecified: Secondary | ICD-10-CM | POA: Diagnosis not present

## 2015-02-21 DIAGNOSIS — E785 Hyperlipidemia, unspecified: Secondary | ICD-10-CM | POA: Diagnosis not present

## 2015-02-21 DIAGNOSIS — E1165 Type 2 diabetes mellitus with hyperglycemia: Secondary | ICD-10-CM | POA: Diagnosis not present

## 2015-03-16 DIAGNOSIS — I509 Heart failure, unspecified: Secondary | ICD-10-CM | POA: Diagnosis not present

## 2015-03-16 DIAGNOSIS — E785 Hyperlipidemia, unspecified: Secondary | ICD-10-CM | POA: Diagnosis not present

## 2015-03-16 DIAGNOSIS — E1165 Type 2 diabetes mellitus with hyperglycemia: Secondary | ICD-10-CM | POA: Diagnosis not present

## 2015-03-16 DIAGNOSIS — I10 Essential (primary) hypertension: Secondary | ICD-10-CM | POA: Diagnosis not present

## 2015-03-17 DIAGNOSIS — I428 Other cardiomyopathies: Secondary | ICD-10-CM | POA: Diagnosis not present

## 2015-03-17 DIAGNOSIS — I429 Cardiomyopathy, unspecified: Secondary | ICD-10-CM | POA: Diagnosis not present

## 2015-03-21 DIAGNOSIS — I10 Essential (primary) hypertension: Secondary | ICD-10-CM | POA: Diagnosis not present

## 2015-03-23 DIAGNOSIS — Z951 Presence of aortocoronary bypass graft: Secondary | ICD-10-CM | POA: Diagnosis not present

## 2015-03-23 DIAGNOSIS — E119 Type 2 diabetes mellitus without complications: Secondary | ICD-10-CM | POA: Diagnosis not present

## 2015-03-23 DIAGNOSIS — I10 Essential (primary) hypertension: Secondary | ICD-10-CM | POA: Diagnosis not present

## 2015-03-23 DIAGNOSIS — E780 Pure hypercholesterolemia: Secondary | ICD-10-CM | POA: Diagnosis not present

## 2015-03-25 DIAGNOSIS — D518 Other vitamin B12 deficiency anemias: Secondary | ICD-10-CM | POA: Diagnosis not present

## 2015-03-25 DIAGNOSIS — C3482 Malignant neoplasm of overlapping sites of left bronchus and lung: Secondary | ICD-10-CM | POA: Diagnosis not present

## 2015-04-10 DIAGNOSIS — I200 Unstable angina: Secondary | ICD-10-CM | POA: Diagnosis not present

## 2015-04-10 DIAGNOSIS — I10 Essential (primary) hypertension: Secondary | ICD-10-CM | POA: Diagnosis not present

## 2015-04-10 DIAGNOSIS — I2510 Atherosclerotic heart disease of native coronary artery without angina pectoris: Secondary | ICD-10-CM | POA: Diagnosis not present

## 2015-04-10 DIAGNOSIS — E780 Pure hypercholesterolemia: Secondary | ICD-10-CM | POA: Diagnosis not present

## 2015-04-10 DIAGNOSIS — I209 Angina pectoris, unspecified: Secondary | ICD-10-CM | POA: Diagnosis not present

## 2015-04-10 DIAGNOSIS — I447 Left bundle-branch block, unspecified: Secondary | ICD-10-CM | POA: Diagnosis not present

## 2015-04-10 DIAGNOSIS — I5022 Chronic systolic (congestive) heart failure: Secondary | ICD-10-CM | POA: Diagnosis not present

## 2015-04-10 DIAGNOSIS — I080 Rheumatic disorders of both mitral and aortic valves: Secondary | ICD-10-CM | POA: Diagnosis not present

## 2015-04-10 DIAGNOSIS — I517 Cardiomegaly: Secondary | ICD-10-CM | POA: Diagnosis not present

## 2015-04-10 DIAGNOSIS — E1165 Type 2 diabetes mellitus with hyperglycemia: Secondary | ICD-10-CM | POA: Diagnosis not present

## 2015-04-10 DIAGNOSIS — Z951 Presence of aortocoronary bypass graft: Secondary | ICD-10-CM | POA: Diagnosis not present

## 2015-04-10 DIAGNOSIS — R0600 Dyspnea, unspecified: Secondary | ICD-10-CM | POA: Diagnosis not present

## 2015-04-10 DIAGNOSIS — R079 Chest pain, unspecified: Secondary | ICD-10-CM | POA: Diagnosis not present

## 2015-04-10 DIAGNOSIS — Z902 Acquired absence of lung [part of]: Secondary | ICD-10-CM | POA: Diagnosis not present

## 2015-04-10 DIAGNOSIS — R339 Retention of urine, unspecified: Secondary | ICD-10-CM | POA: Diagnosis not present

## 2015-04-10 DIAGNOSIS — Z85118 Personal history of other malignant neoplasm of bronchus and lung: Secondary | ICD-10-CM | POA: Diagnosis not present

## 2015-04-10 DIAGNOSIS — Z79899 Other long term (current) drug therapy: Secondary | ICD-10-CM | POA: Diagnosis not present

## 2015-04-10 DIAGNOSIS — Z794 Long term (current) use of insulin: Secondary | ICD-10-CM | POA: Diagnosis not present

## 2015-04-10 DIAGNOSIS — I25700 Atherosclerosis of coronary artery bypass graft(s), unspecified, with unstable angina pectoris: Secondary | ICD-10-CM | POA: Diagnosis not present

## 2015-04-10 DIAGNOSIS — Z7982 Long term (current) use of aspirin: Secondary | ICD-10-CM | POA: Diagnosis not present

## 2015-04-11 DIAGNOSIS — Z794 Long term (current) use of insulin: Secondary | ICD-10-CM | POA: Diagnosis not present

## 2015-04-11 DIAGNOSIS — I509 Heart failure, unspecified: Secondary | ICD-10-CM | POA: Diagnosis not present

## 2015-04-11 DIAGNOSIS — I25708 Atherosclerosis of coronary artery bypass graft(s), unspecified, with other forms of angina pectoris: Secondary | ICD-10-CM | POA: Diagnosis not present

## 2015-04-11 DIAGNOSIS — R079 Chest pain, unspecified: Secondary | ICD-10-CM | POA: Diagnosis not present

## 2015-04-11 DIAGNOSIS — I25810 Atherosclerosis of coronary artery bypass graft(s) without angina pectoris: Secondary | ICD-10-CM | POA: Diagnosis not present

## 2015-04-11 DIAGNOSIS — R9431 Abnormal electrocardiogram [ECG] [EKG]: Secondary | ICD-10-CM | POA: Diagnosis not present

## 2015-04-11 DIAGNOSIS — I255 Ischemic cardiomyopathy: Secondary | ICD-10-CM | POA: Diagnosis not present

## 2015-04-11 DIAGNOSIS — Z79899 Other long term (current) drug therapy: Secondary | ICD-10-CM | POA: Diagnosis not present

## 2015-04-11 DIAGNOSIS — I440 Atrioventricular block, first degree: Secondary | ICD-10-CM | POA: Diagnosis not present

## 2015-04-11 DIAGNOSIS — Z951 Presence of aortocoronary bypass graft: Secondary | ICD-10-CM | POA: Diagnosis not present

## 2015-04-11 DIAGNOSIS — E119 Type 2 diabetes mellitus without complications: Secondary | ICD-10-CM | POA: Diagnosis not present

## 2015-04-11 DIAGNOSIS — I2582 Chronic total occlusion of coronary artery: Secondary | ICD-10-CM | POA: Diagnosis not present

## 2015-04-11 DIAGNOSIS — I200 Unstable angina: Secondary | ICD-10-CM | POA: Diagnosis not present

## 2015-04-11 DIAGNOSIS — Z7982 Long term (current) use of aspirin: Secondary | ICD-10-CM | POA: Diagnosis not present

## 2015-04-11 DIAGNOSIS — I25700 Atherosclerosis of coronary artery bypass graft(s), unspecified, with unstable angina pectoris: Secondary | ICD-10-CM | POA: Diagnosis not present

## 2015-04-11 DIAGNOSIS — E785 Hyperlipidemia, unspecified: Secondary | ICD-10-CM | POA: Diagnosis not present

## 2015-04-11 DIAGNOSIS — I10 Essential (primary) hypertension: Secondary | ICD-10-CM | POA: Diagnosis not present

## 2015-04-11 DIAGNOSIS — I5022 Chronic systolic (congestive) heart failure: Secondary | ICD-10-CM | POA: Diagnosis not present

## 2015-04-11 DIAGNOSIS — I2510 Atherosclerotic heart disease of native coronary artery without angina pectoris: Secondary | ICD-10-CM | POA: Diagnosis not present

## 2015-04-12 DIAGNOSIS — I255 Ischemic cardiomyopathy: Secondary | ICD-10-CM | POA: Diagnosis not present

## 2015-04-12 DIAGNOSIS — I25810 Atherosclerosis of coronary artery bypass graft(s) without angina pectoris: Secondary | ICD-10-CM | POA: Diagnosis not present

## 2015-04-12 DIAGNOSIS — I25118 Atherosclerotic heart disease of native coronary artery with other forms of angina pectoris: Secondary | ICD-10-CM | POA: Diagnosis not present

## 2015-04-12 DIAGNOSIS — I10 Essential (primary) hypertension: Secondary | ICD-10-CM | POA: Diagnosis not present

## 2015-04-12 DIAGNOSIS — Z9861 Coronary angioplasty status: Secondary | ICD-10-CM | POA: Diagnosis not present

## 2015-04-12 DIAGNOSIS — E119 Type 2 diabetes mellitus without complications: Secondary | ICD-10-CM | POA: Diagnosis not present

## 2015-04-12 DIAGNOSIS — Z79899 Other long term (current) drug therapy: Secondary | ICD-10-CM | POA: Diagnosis not present

## 2015-04-12 DIAGNOSIS — I2510 Atherosclerotic heart disease of native coronary artery without angina pectoris: Secondary | ICD-10-CM | POA: Diagnosis not present

## 2015-04-19 DIAGNOSIS — R600 Localized edema: Secondary | ICD-10-CM | POA: Diagnosis not present

## 2015-04-19 DIAGNOSIS — I2510 Atherosclerotic heart disease of native coronary artery without angina pectoris: Secondary | ICD-10-CM | POA: Diagnosis not present

## 2015-04-19 DIAGNOSIS — I10 Essential (primary) hypertension: Secondary | ICD-10-CM | POA: Diagnosis not present

## 2015-04-19 DIAGNOSIS — E119 Type 2 diabetes mellitus without complications: Secondary | ICD-10-CM | POA: Diagnosis not present

## 2015-05-06 DIAGNOSIS — I10 Essential (primary) hypertension: Secondary | ICD-10-CM | POA: Diagnosis not present

## 2015-05-06 DIAGNOSIS — E785 Hyperlipidemia, unspecified: Secondary | ICD-10-CM | POA: Diagnosis not present

## 2015-05-06 DIAGNOSIS — I5023 Acute on chronic systolic (congestive) heart failure: Secondary | ICD-10-CM | POA: Diagnosis not present

## 2015-05-06 DIAGNOSIS — R600 Localized edema: Secondary | ICD-10-CM | POA: Diagnosis not present

## 2015-05-06 DIAGNOSIS — E119 Type 2 diabetes mellitus without complications: Secondary | ICD-10-CM | POA: Diagnosis not present

## 2015-05-10 DIAGNOSIS — Z23 Encounter for immunization: Secondary | ICD-10-CM | POA: Diagnosis not present

## 2015-05-31 DIAGNOSIS — C3482 Malignant neoplasm of overlapping sites of left bronchus and lung: Secondary | ICD-10-CM | POA: Diagnosis not present

## 2015-05-31 DIAGNOSIS — R918 Other nonspecific abnormal finding of lung field: Secondary | ICD-10-CM | POA: Diagnosis not present

## 2015-05-31 DIAGNOSIS — J90 Pleural effusion, not elsewhere classified: Secondary | ICD-10-CM | POA: Diagnosis not present

## 2015-06-01 DIAGNOSIS — R599 Enlarged lymph nodes, unspecified: Secondary | ICD-10-CM | POA: Diagnosis not present

## 2015-06-01 DIAGNOSIS — R17 Unspecified jaundice: Secondary | ICD-10-CM | POA: Diagnosis not present

## 2015-06-01 DIAGNOSIS — C3482 Malignant neoplasm of overlapping sites of left bronchus and lung: Secondary | ICD-10-CM | POA: Diagnosis not present

## 2015-06-01 DIAGNOSIS — Z85118 Personal history of other malignant neoplasm of bronchus and lung: Secondary | ICD-10-CM | POA: Diagnosis not present

## 2015-06-01 DIAGNOSIS — R918 Other nonspecific abnormal finding of lung field: Secondary | ICD-10-CM | POA: Diagnosis not present

## 2015-06-27 DIAGNOSIS — I10 Essential (primary) hypertension: Secondary | ICD-10-CM | POA: Diagnosis not present

## 2015-06-27 DIAGNOSIS — E782 Mixed hyperlipidemia: Secondary | ICD-10-CM | POA: Diagnosis not present

## 2015-06-27 DIAGNOSIS — I255 Ischemic cardiomyopathy: Secondary | ICD-10-CM | POA: Diagnosis not present

## 2015-06-27 DIAGNOSIS — Z951 Presence of aortocoronary bypass graft: Secondary | ICD-10-CM | POA: Diagnosis not present

## 2015-06-27 DIAGNOSIS — I25709 Atherosclerosis of coronary artery bypass graft(s), unspecified, with unspecified angina pectoris: Secondary | ICD-10-CM | POA: Diagnosis not present

## 2015-06-27 DIAGNOSIS — E119 Type 2 diabetes mellitus without complications: Secondary | ICD-10-CM | POA: Diagnosis not present

## 2015-07-20 DIAGNOSIS — I10 Essential (primary) hypertension: Secondary | ICD-10-CM | POA: Diagnosis not present

## 2015-07-20 DIAGNOSIS — I5023 Acute on chronic systolic (congestive) heart failure: Secondary | ICD-10-CM | POA: Diagnosis not present

## 2015-07-20 DIAGNOSIS — E1151 Type 2 diabetes mellitus with diabetic peripheral angiopathy without gangrene: Secondary | ICD-10-CM | POA: Diagnosis not present

## 2015-07-20 DIAGNOSIS — E119 Type 2 diabetes mellitus without complications: Secondary | ICD-10-CM | POA: Diagnosis not present

## 2015-07-23 DIAGNOSIS — R1031 Right lower quadrant pain: Secondary | ICD-10-CM | POA: Diagnosis not present

## 2015-07-23 DIAGNOSIS — R0789 Other chest pain: Secondary | ICD-10-CM | POA: Diagnosis not present

## 2015-07-23 DIAGNOSIS — R072 Precordial pain: Secondary | ICD-10-CM | POA: Diagnosis not present

## 2015-07-23 DIAGNOSIS — R110 Nausea: Secondary | ICD-10-CM | POA: Diagnosis not present

## 2015-07-23 DIAGNOSIS — R0602 Shortness of breath: Secondary | ICD-10-CM | POA: Diagnosis not present

## 2015-08-03 DIAGNOSIS — I509 Heart failure, unspecified: Secondary | ICD-10-CM | POA: Diagnosis not present

## 2015-08-03 DIAGNOSIS — J209 Acute bronchitis, unspecified: Secondary | ICD-10-CM | POA: Diagnosis not present

## 2015-08-05 DIAGNOSIS — C801 Malignant (primary) neoplasm, unspecified: Secondary | ICD-10-CM | POA: Diagnosis not present

## 2015-08-05 DIAGNOSIS — R0600 Dyspnea, unspecified: Secondary | ICD-10-CM | POA: Diagnosis not present

## 2015-08-05 DIAGNOSIS — R918 Other nonspecific abnormal finding of lung field: Secondary | ICD-10-CM | POA: Diagnosis not present

## 2015-08-12 DIAGNOSIS — E119 Type 2 diabetes mellitus without complications: Secondary | ICD-10-CM | POA: Diagnosis not present

## 2015-08-12 DIAGNOSIS — I509 Heart failure, unspecified: Secondary | ICD-10-CM | POA: Diagnosis not present

## 2015-08-12 DIAGNOSIS — I10 Essential (primary) hypertension: Secondary | ICD-10-CM | POA: Diagnosis not present

## 2015-08-12 DIAGNOSIS — I2510 Atherosclerotic heart disease of native coronary artery without angina pectoris: Secondary | ICD-10-CM | POA: Diagnosis not present

## 2015-08-12 DIAGNOSIS — E1151 Type 2 diabetes mellitus with diabetic peripheral angiopathy without gangrene: Secondary | ICD-10-CM | POA: Diagnosis not present

## 2015-08-12 DIAGNOSIS — R600 Localized edema: Secondary | ICD-10-CM | POA: Diagnosis not present

## 2015-10-13 DIAGNOSIS — C3482 Malignant neoplasm of overlapping sites of left bronchus and lung: Secondary | ICD-10-CM | POA: Diagnosis not present

## 2015-10-13 DIAGNOSIS — R918 Other nonspecific abnormal finding of lung field: Secondary | ICD-10-CM | POA: Diagnosis not present

## 2015-10-13 DIAGNOSIS — Z85118 Personal history of other malignant neoplasm of bronchus and lung: Secondary | ICD-10-CM | POA: Diagnosis not present

## 2015-10-18 DIAGNOSIS — J90 Pleural effusion, not elsewhere classified: Secondary | ICD-10-CM | POA: Diagnosis not present

## 2015-10-18 DIAGNOSIS — D518 Other vitamin B12 deficiency anemias: Secondary | ICD-10-CM | POA: Diagnosis not present

## 2015-10-18 DIAGNOSIS — I700 Atherosclerosis of aorta: Secondary | ICD-10-CM | POA: Diagnosis not present

## 2015-10-18 DIAGNOSIS — I2510 Atherosclerotic heart disease of native coronary artery without angina pectoris: Secondary | ICD-10-CM | POA: Diagnosis not present

## 2015-10-18 DIAGNOSIS — R911 Solitary pulmonary nodule: Secondary | ICD-10-CM | POA: Diagnosis not present

## 2015-10-18 DIAGNOSIS — R591 Generalized enlarged lymph nodes: Secondary | ICD-10-CM | POA: Diagnosis not present

## 2015-10-18 DIAGNOSIS — C3482 Malignant neoplasm of overlapping sites of left bronchus and lung: Secondary | ICD-10-CM | POA: Diagnosis not present

## 2015-11-23 DIAGNOSIS — E113393 Type 2 diabetes mellitus with moderate nonproliferative diabetic retinopathy without macular edema, bilateral: Secondary | ICD-10-CM | POA: Diagnosis not present

## 2015-11-29 DIAGNOSIS — E1151 Type 2 diabetes mellitus with diabetic peripheral angiopathy without gangrene: Secondary | ICD-10-CM | POA: Diagnosis not present

## 2015-11-29 DIAGNOSIS — E785 Hyperlipidemia, unspecified: Secondary | ICD-10-CM | POA: Diagnosis not present

## 2015-11-29 DIAGNOSIS — I2510 Atherosclerotic heart disease of native coronary artery without angina pectoris: Secondary | ICD-10-CM | POA: Diagnosis not present

## 2015-11-29 DIAGNOSIS — R609 Edema, unspecified: Secondary | ICD-10-CM | POA: Diagnosis not present

## 2015-11-29 DIAGNOSIS — I509 Heart failure, unspecified: Secondary | ICD-10-CM | POA: Diagnosis not present

## 2015-12-02 DIAGNOSIS — I509 Heart failure, unspecified: Secondary | ICD-10-CM | POA: Diagnosis not present

## 2015-12-02 DIAGNOSIS — E1159 Type 2 diabetes mellitus with other circulatory complications: Secondary | ICD-10-CM | POA: Diagnosis not present

## 2015-12-07 DIAGNOSIS — I5022 Chronic systolic (congestive) heart failure: Secondary | ICD-10-CM | POA: Diagnosis not present

## 2015-12-07 DIAGNOSIS — I25709 Atherosclerosis of coronary artery bypass graft(s), unspecified, with unspecified angina pectoris: Secondary | ICD-10-CM | POA: Diagnosis not present

## 2015-12-07 DIAGNOSIS — I255 Ischemic cardiomyopathy: Secondary | ICD-10-CM | POA: Diagnosis not present

## 2015-12-07 DIAGNOSIS — Z951 Presence of aortocoronary bypass graft: Secondary | ICD-10-CM | POA: Diagnosis not present

## 2015-12-07 DIAGNOSIS — I10 Essential (primary) hypertension: Secondary | ICD-10-CM | POA: Diagnosis not present

## 2015-12-07 DIAGNOSIS — E782 Mixed hyperlipidemia: Secondary | ICD-10-CM | POA: Diagnosis not present

## 2015-12-07 DIAGNOSIS — E119 Type 2 diabetes mellitus without complications: Secondary | ICD-10-CM | POA: Diagnosis not present

## 2016-01-04 DIAGNOSIS — E1151 Type 2 diabetes mellitus with diabetic peripheral angiopathy without gangrene: Secondary | ICD-10-CM | POA: Diagnosis not present

## 2016-01-04 DIAGNOSIS — I10 Essential (primary) hypertension: Secondary | ICD-10-CM | POA: Diagnosis not present

## 2016-01-04 DIAGNOSIS — E785 Hyperlipidemia, unspecified: Secondary | ICD-10-CM | POA: Diagnosis not present

## 2016-01-04 DIAGNOSIS — I2510 Atherosclerotic heart disease of native coronary artery without angina pectoris: Secondary | ICD-10-CM | POA: Diagnosis not present

## 2016-01-04 DIAGNOSIS — E1159 Type 2 diabetes mellitus with other circulatory complications: Secondary | ICD-10-CM | POA: Diagnosis not present

## 2016-01-04 DIAGNOSIS — I509 Heart failure, unspecified: Secondary | ICD-10-CM | POA: Diagnosis not present

## 2016-01-04 DIAGNOSIS — R531 Weakness: Secondary | ICD-10-CM | POA: Diagnosis not present

## 2016-02-10 DIAGNOSIS — J209 Acute bronchitis, unspecified: Secondary | ICD-10-CM | POA: Diagnosis not present

## 2016-03-27 DIAGNOSIS — R918 Other nonspecific abnormal finding of lung field: Secondary | ICD-10-CM | POA: Diagnosis not present

## 2016-03-27 DIAGNOSIS — I700 Atherosclerosis of aorta: Secondary | ICD-10-CM | POA: Diagnosis not present

## 2016-03-27 DIAGNOSIS — D518 Other vitamin B12 deficiency anemias: Secondary | ICD-10-CM | POA: Diagnosis not present

## 2016-03-27 DIAGNOSIS — C3482 Malignant neoplasm of overlapping sites of left bronchus and lung: Secondary | ICD-10-CM | POA: Diagnosis not present

## 2016-03-27 DIAGNOSIS — M47894 Other spondylosis, thoracic region: Secondary | ICD-10-CM | POA: Diagnosis not present

## 2016-03-28 DIAGNOSIS — C3490 Malignant neoplasm of unspecified part of unspecified bronchus or lung: Secondary | ICD-10-CM | POA: Diagnosis not present

## 2016-03-28 DIAGNOSIS — Z85118 Personal history of other malignant neoplasm of bronchus and lung: Secondary | ICD-10-CM | POA: Diagnosis not present

## 2016-03-29 DIAGNOSIS — I255 Ischemic cardiomyopathy: Secondary | ICD-10-CM | POA: Diagnosis not present

## 2016-03-29 DIAGNOSIS — E119 Type 2 diabetes mellitus without complications: Secondary | ICD-10-CM | POA: Diagnosis not present

## 2016-03-29 DIAGNOSIS — I10 Essential (primary) hypertension: Secondary | ICD-10-CM | POA: Diagnosis not present

## 2016-03-29 DIAGNOSIS — I25709 Atherosclerosis of coronary artery bypass graft(s), unspecified, with unspecified angina pectoris: Secondary | ICD-10-CM | POA: Diagnosis not present

## 2016-03-29 DIAGNOSIS — Z951 Presence of aortocoronary bypass graft: Secondary | ICD-10-CM | POA: Diagnosis not present

## 2016-04-17 DIAGNOSIS — E119 Type 2 diabetes mellitus without complications: Secondary | ICD-10-CM | POA: Diagnosis not present

## 2016-04-17 DIAGNOSIS — I255 Ischemic cardiomyopathy: Secondary | ICD-10-CM | POA: Diagnosis not present

## 2016-04-17 DIAGNOSIS — I10 Essential (primary) hypertension: Secondary | ICD-10-CM | POA: Diagnosis not present

## 2016-04-17 DIAGNOSIS — Z951 Presence of aortocoronary bypass graft: Secondary | ICD-10-CM | POA: Diagnosis not present

## 2016-04-17 DIAGNOSIS — I25709 Atherosclerosis of coronary artery bypass graft(s), unspecified, with unspecified angina pectoris: Secondary | ICD-10-CM | POA: Diagnosis not present

## 2016-05-10 DIAGNOSIS — E782 Mixed hyperlipidemia: Secondary | ICD-10-CM | POA: Diagnosis not present

## 2016-05-10 DIAGNOSIS — I10 Essential (primary) hypertension: Secondary | ICD-10-CM | POA: Diagnosis not present

## 2016-05-10 DIAGNOSIS — I350 Nonrheumatic aortic (valve) stenosis: Secondary | ICD-10-CM | POA: Diagnosis not present

## 2016-05-10 DIAGNOSIS — Z951 Presence of aortocoronary bypass graft: Secondary | ICD-10-CM | POA: Diagnosis not present

## 2016-05-10 DIAGNOSIS — I25709 Atherosclerosis of coronary artery bypass graft(s), unspecified, with unspecified angina pectoris: Secondary | ICD-10-CM | POA: Diagnosis not present

## 2016-05-10 DIAGNOSIS — I255 Ischemic cardiomyopathy: Secondary | ICD-10-CM | POA: Diagnosis not present

## 2016-05-23 DIAGNOSIS — E785 Hyperlipidemia, unspecified: Secondary | ICD-10-CM | POA: Diagnosis not present

## 2016-05-23 DIAGNOSIS — E119 Type 2 diabetes mellitus without complications: Secondary | ICD-10-CM | POA: Diagnosis not present

## 2016-05-23 DIAGNOSIS — E1151 Type 2 diabetes mellitus with diabetic peripheral angiopathy without gangrene: Secondary | ICD-10-CM | POA: Diagnosis not present

## 2016-05-23 DIAGNOSIS — I10 Essential (primary) hypertension: Secondary | ICD-10-CM | POA: Diagnosis not present

## 2016-05-23 DIAGNOSIS — Z6829 Body mass index (BMI) 29.0-29.9, adult: Secondary | ICD-10-CM | POA: Diagnosis not present

## 2016-05-25 DIAGNOSIS — Z951 Presence of aortocoronary bypass graft: Secondary | ICD-10-CM | POA: Diagnosis not present

## 2016-05-25 DIAGNOSIS — N400 Enlarged prostate without lower urinary tract symptoms: Secondary | ICD-10-CM | POA: Diagnosis not present

## 2016-05-25 DIAGNOSIS — I5022 Chronic systolic (congestive) heart failure: Secondary | ICD-10-CM | POA: Diagnosis not present

## 2016-05-25 DIAGNOSIS — Z7984 Long term (current) use of oral hypoglycemic drugs: Secondary | ICD-10-CM | POA: Diagnosis not present

## 2016-05-25 DIAGNOSIS — E785 Hyperlipidemia, unspecified: Secondary | ICD-10-CM | POA: Diagnosis not present

## 2016-05-25 DIAGNOSIS — Z85118 Personal history of other malignant neoplasm of bronchus and lung: Secondary | ICD-10-CM | POA: Diagnosis not present

## 2016-05-25 DIAGNOSIS — E669 Obesity, unspecified: Secondary | ICD-10-CM | POA: Diagnosis not present

## 2016-05-25 DIAGNOSIS — I272 Other secondary pulmonary hypertension: Secondary | ICD-10-CM | POA: Diagnosis not present

## 2016-05-25 DIAGNOSIS — I110 Hypertensive heart disease with heart failure: Secondary | ICD-10-CM | POA: Diagnosis not present

## 2016-05-25 DIAGNOSIS — I10 Essential (primary) hypertension: Secondary | ICD-10-CM | POA: Diagnosis not present

## 2016-05-25 DIAGNOSIS — E119 Type 2 diabetes mellitus without complications: Secondary | ICD-10-CM | POA: Diagnosis not present

## 2016-05-25 DIAGNOSIS — I255 Ischemic cardiomyopathy: Secondary | ICD-10-CM | POA: Diagnosis not present

## 2016-05-25 DIAGNOSIS — I25119 Atherosclerotic heart disease of native coronary artery with unspecified angina pectoris: Secondary | ICD-10-CM | POA: Diagnosis not present

## 2016-05-25 DIAGNOSIS — I350 Nonrheumatic aortic (valve) stenosis: Secondary | ICD-10-CM | POA: Diagnosis not present

## 2016-05-25 DIAGNOSIS — I25719 Atherosclerosis of autologous vein coronary artery bypass graft(s) with unspecified angina pectoris: Secondary | ICD-10-CM | POA: Diagnosis not present

## 2016-05-25 DIAGNOSIS — Z955 Presence of coronary angioplasty implant and graft: Secondary | ICD-10-CM | POA: Diagnosis not present

## 2016-05-25 DIAGNOSIS — I2582 Chronic total occlusion of coronary artery: Secondary | ICD-10-CM | POA: Diagnosis not present

## 2016-05-25 DIAGNOSIS — I25709 Atherosclerosis of coronary artery bypass graft(s), unspecified, with unspecified angina pectoris: Secondary | ICD-10-CM | POA: Diagnosis not present

## 2016-05-25 DIAGNOSIS — Z7982 Long term (current) use of aspirin: Secondary | ICD-10-CM | POA: Diagnosis not present

## 2016-05-25 DIAGNOSIS — Z8249 Family history of ischemic heart disease and other diseases of the circulatory system: Secondary | ICD-10-CM | POA: Diagnosis not present

## 2016-05-25 DIAGNOSIS — Z79899 Other long term (current) drug therapy: Secondary | ICD-10-CM | POA: Diagnosis not present

## 2016-05-29 DIAGNOSIS — I25709 Atherosclerosis of coronary artery bypass graft(s), unspecified, with unspecified angina pectoris: Secondary | ICD-10-CM | POA: Diagnosis not present

## 2016-05-29 DIAGNOSIS — I10 Essential (primary) hypertension: Secondary | ICD-10-CM | POA: Diagnosis not present

## 2016-05-29 DIAGNOSIS — I255 Ischemic cardiomyopathy: Secondary | ICD-10-CM | POA: Diagnosis not present

## 2016-05-29 DIAGNOSIS — E782 Mixed hyperlipidemia: Secondary | ICD-10-CM | POA: Diagnosis not present

## 2016-05-29 DIAGNOSIS — I5022 Chronic systolic (congestive) heart failure: Secondary | ICD-10-CM | POA: Diagnosis not present

## 2016-05-29 DIAGNOSIS — Z951 Presence of aortocoronary bypass graft: Secondary | ICD-10-CM | POA: Diagnosis not present

## 2016-05-29 DIAGNOSIS — E119 Type 2 diabetes mellitus without complications: Secondary | ICD-10-CM | POA: Diagnosis not present

## 2016-06-06 DIAGNOSIS — I5042 Chronic combined systolic (congestive) and diastolic (congestive) heart failure: Secondary | ICD-10-CM | POA: Diagnosis not present

## 2016-06-06 DIAGNOSIS — I5022 Chronic systolic (congestive) heart failure: Secondary | ICD-10-CM | POA: Diagnosis not present

## 2016-06-06 DIAGNOSIS — I350 Nonrheumatic aortic (valve) stenosis: Secondary | ICD-10-CM | POA: Diagnosis not present

## 2016-06-06 DIAGNOSIS — I2510 Atherosclerotic heart disease of native coronary artery without angina pectoris: Secondary | ICD-10-CM | POA: Diagnosis not present

## 2016-06-06 DIAGNOSIS — Z951 Presence of aortocoronary bypass graft: Secondary | ICD-10-CM | POA: Diagnosis not present

## 2016-06-08 DIAGNOSIS — I429 Cardiomyopathy, unspecified: Secondary | ICD-10-CM | POA: Diagnosis not present

## 2016-06-08 DIAGNOSIS — I10 Essential (primary) hypertension: Secondary | ICD-10-CM | POA: Diagnosis not present

## 2016-06-08 DIAGNOSIS — E1151 Type 2 diabetes mellitus with diabetic peripheral angiopathy without gangrene: Secondary | ICD-10-CM | POA: Diagnosis not present

## 2016-06-08 DIAGNOSIS — Z6828 Body mass index (BMI) 28.0-28.9, adult: Secondary | ICD-10-CM | POA: Diagnosis not present

## 2016-06-08 DIAGNOSIS — Z23 Encounter for immunization: Secondary | ICD-10-CM | POA: Diagnosis not present

## 2016-06-08 DIAGNOSIS — E785 Hyperlipidemia, unspecified: Secondary | ICD-10-CM | POA: Diagnosis not present

## 2016-06-25 DIAGNOSIS — E113393 Type 2 diabetes mellitus with moderate nonproliferative diabetic retinopathy without macular edema, bilateral: Secondary | ICD-10-CM | POA: Diagnosis not present

## 2016-06-26 DIAGNOSIS — I25709 Atherosclerosis of coronary artery bypass graft(s), unspecified, with unspecified angina pectoris: Secondary | ICD-10-CM | POA: Diagnosis not present

## 2016-06-26 DIAGNOSIS — E119 Type 2 diabetes mellitus without complications: Secondary | ICD-10-CM | POA: Diagnosis not present

## 2016-06-26 DIAGNOSIS — I10 Essential (primary) hypertension: Secondary | ICD-10-CM | POA: Diagnosis not present

## 2016-06-26 DIAGNOSIS — E782 Mixed hyperlipidemia: Secondary | ICD-10-CM | POA: Diagnosis not present

## 2016-06-26 DIAGNOSIS — I5042 Chronic combined systolic (congestive) and diastolic (congestive) heart failure: Secondary | ICD-10-CM | POA: Diagnosis not present

## 2016-06-26 DIAGNOSIS — Z951 Presence of aortocoronary bypass graft: Secondary | ICD-10-CM | POA: Diagnosis not present

## 2016-06-26 DIAGNOSIS — I255 Ischemic cardiomyopathy: Secondary | ICD-10-CM | POA: Diagnosis not present

## 2016-07-30 DIAGNOSIS — E1151 Type 2 diabetes mellitus with diabetic peripheral angiopathy without gangrene: Secondary | ICD-10-CM | POA: Diagnosis not present

## 2016-07-30 DIAGNOSIS — E1140 Type 2 diabetes mellitus with diabetic neuropathy, unspecified: Secondary | ICD-10-CM | POA: Diagnosis not present

## 2016-07-30 DIAGNOSIS — E785 Hyperlipidemia, unspecified: Secondary | ICD-10-CM | POA: Diagnosis not present

## 2016-08-15 DIAGNOSIS — I255 Ischemic cardiomyopathy: Secondary | ICD-10-CM | POA: Diagnosis not present

## 2016-08-15 DIAGNOSIS — I5022 Chronic systolic (congestive) heart failure: Secondary | ICD-10-CM | POA: Diagnosis not present

## 2016-08-15 DIAGNOSIS — I4589 Other specified conduction disorders: Secondary | ICD-10-CM | POA: Diagnosis not present

## 2016-08-15 DIAGNOSIS — I459 Conduction disorder, unspecified: Secondary | ICD-10-CM | POA: Diagnosis not present

## 2016-08-15 DIAGNOSIS — I5042 Chronic combined systolic (congestive) and diastolic (congestive) heart failure: Secondary | ICD-10-CM | POA: Diagnosis not present

## 2016-08-15 DIAGNOSIS — I272 Pulmonary hypertension, unspecified: Secondary | ICD-10-CM | POA: Diagnosis not present

## 2016-08-15 DIAGNOSIS — I519 Heart disease, unspecified: Secondary | ICD-10-CM | POA: Diagnosis not present

## 2016-08-15 DIAGNOSIS — I2510 Atherosclerotic heart disease of native coronary artery without angina pectoris: Secondary | ICD-10-CM | POA: Diagnosis not present

## 2016-08-15 DIAGNOSIS — I501 Left ventricular failure: Secondary | ICD-10-CM | POA: Diagnosis not present

## 2016-08-15 DIAGNOSIS — I440 Atrioventricular block, first degree: Secondary | ICD-10-CM | POA: Diagnosis not present

## 2016-08-15 DIAGNOSIS — Z951 Presence of aortocoronary bypass graft: Secondary | ICD-10-CM | POA: Diagnosis not present

## 2016-09-03 DIAGNOSIS — J209 Acute bronchitis, unspecified: Secondary | ICD-10-CM | POA: Diagnosis not present

## 2016-09-05 DIAGNOSIS — J9600 Acute respiratory failure, unspecified whether with hypoxia or hypercapnia: Secondary | ICD-10-CM | POA: Diagnosis not present

## 2016-09-05 DIAGNOSIS — I252 Old myocardial infarction: Secondary | ICD-10-CM | POA: Diagnosis not present

## 2016-09-05 DIAGNOSIS — Z79899 Other long term (current) drug therapy: Secondary | ICD-10-CM | POA: Diagnosis not present

## 2016-09-05 DIAGNOSIS — I5033 Acute on chronic diastolic (congestive) heart failure: Secondary | ICD-10-CM | POA: Diagnosis not present

## 2016-09-05 DIAGNOSIS — I509 Heart failure, unspecified: Secondary | ICD-10-CM | POA: Diagnosis not present

## 2016-09-05 DIAGNOSIS — I10 Essential (primary) hypertension: Secondary | ICD-10-CM

## 2016-09-05 DIAGNOSIS — R339 Retention of urine, unspecified: Secondary | ICD-10-CM | POA: Diagnosis not present

## 2016-09-05 DIAGNOSIS — R079 Chest pain, unspecified: Secondary | ICD-10-CM | POA: Diagnosis not present

## 2016-09-05 DIAGNOSIS — R911 Solitary pulmonary nodule: Secondary | ICD-10-CM | POA: Diagnosis present

## 2016-09-05 DIAGNOSIS — Z794 Long term (current) use of insulin: Secondary | ICD-10-CM | POA: Diagnosis not present

## 2016-09-05 DIAGNOSIS — J189 Pneumonia, unspecified organism: Secondary | ICD-10-CM | POA: Diagnosis not present

## 2016-09-05 DIAGNOSIS — E871 Hypo-osmolality and hyponatremia: Secondary | ICD-10-CM | POA: Diagnosis not present

## 2016-09-05 DIAGNOSIS — Z7984 Long term (current) use of oral hypoglycemic drugs: Secondary | ICD-10-CM | POA: Diagnosis not present

## 2016-09-05 DIAGNOSIS — Z7982 Long term (current) use of aspirin: Secondary | ICD-10-CM | POA: Diagnosis not present

## 2016-09-05 DIAGNOSIS — E785 Hyperlipidemia, unspecified: Secondary | ICD-10-CM | POA: Diagnosis present

## 2016-09-05 DIAGNOSIS — R0602 Shortness of breath: Secondary | ICD-10-CM | POA: Diagnosis not present

## 2016-09-05 DIAGNOSIS — Z951 Presence of aortocoronary bypass graft: Secondary | ICD-10-CM | POA: Diagnosis not present

## 2016-09-05 DIAGNOSIS — I110 Hypertensive heart disease with heart failure: Secondary | ICD-10-CM | POA: Diagnosis not present

## 2016-09-05 DIAGNOSIS — E1165 Type 2 diabetes mellitus with hyperglycemia: Secondary | ICD-10-CM | POA: Diagnosis not present

## 2016-09-05 DIAGNOSIS — E7800 Pure hypercholesterolemia, unspecified: Secondary | ICD-10-CM | POA: Diagnosis not present

## 2016-09-06 DIAGNOSIS — R079 Chest pain, unspecified: Secondary | ICD-10-CM

## 2016-09-11 DIAGNOSIS — J449 Chronic obstructive pulmonary disease, unspecified: Secondary | ICD-10-CM | POA: Diagnosis not present

## 2016-09-11 DIAGNOSIS — J189 Pneumonia, unspecified organism: Secondary | ICD-10-CM | POA: Diagnosis not present

## 2016-09-28 DIAGNOSIS — R918 Other nonspecific abnormal finding of lung field: Secondary | ICD-10-CM | POA: Diagnosis not present

## 2016-09-28 DIAGNOSIS — R079 Chest pain, unspecified: Secondary | ICD-10-CM | POA: Diagnosis not present

## 2016-09-28 DIAGNOSIS — E119 Type 2 diabetes mellitus without complications: Secondary | ICD-10-CM | POA: Diagnosis not present

## 2016-09-28 DIAGNOSIS — R05 Cough: Secondary | ICD-10-CM | POA: Diagnosis not present

## 2016-09-28 DIAGNOSIS — E1165 Type 2 diabetes mellitus with hyperglycemia: Secondary | ICD-10-CM | POA: Diagnosis not present

## 2016-09-28 DIAGNOSIS — J40 Bronchitis, not specified as acute or chronic: Secondary | ICD-10-CM | POA: Diagnosis not present

## 2016-10-29 DIAGNOSIS — E1151 Type 2 diabetes mellitus with diabetic peripheral angiopathy without gangrene: Secondary | ICD-10-CM | POA: Diagnosis not present

## 2016-10-29 DIAGNOSIS — J40 Bronchitis, not specified as acute or chronic: Secondary | ICD-10-CM | POA: Diagnosis not present

## 2016-10-29 DIAGNOSIS — E1165 Type 2 diabetes mellitus with hyperglycemia: Secondary | ICD-10-CM | POA: Diagnosis not present

## 2016-11-27 DIAGNOSIS — J40 Bronchitis, not specified as acute or chronic: Secondary | ICD-10-CM | POA: Diagnosis not present

## 2016-11-28 DIAGNOSIS — I25709 Atherosclerosis of coronary artery bypass graft(s), unspecified, with unspecified angina pectoris: Secondary | ICD-10-CM | POA: Diagnosis not present

## 2016-11-28 DIAGNOSIS — E782 Mixed hyperlipidemia: Secondary | ICD-10-CM | POA: Diagnosis not present

## 2016-11-28 DIAGNOSIS — I10 Essential (primary) hypertension: Secondary | ICD-10-CM | POA: Diagnosis not present

## 2016-11-28 DIAGNOSIS — I255 Ischemic cardiomyopathy: Secondary | ICD-10-CM | POA: Diagnosis not present

## 2016-11-28 DIAGNOSIS — E119 Type 2 diabetes mellitus without complications: Secondary | ICD-10-CM | POA: Diagnosis not present

## 2016-11-28 DIAGNOSIS — Z951 Presence of aortocoronary bypass graft: Secondary | ICD-10-CM | POA: Diagnosis not present

## 2016-12-06 DIAGNOSIS — Z95810 Presence of automatic (implantable) cardiac defibrillator: Secondary | ICD-10-CM | POA: Diagnosis not present

## 2016-12-06 DIAGNOSIS — Z951 Presence of aortocoronary bypass graft: Secondary | ICD-10-CM | POA: Diagnosis not present

## 2016-12-06 DIAGNOSIS — I509 Heart failure, unspecified: Secondary | ICD-10-CM | POA: Diagnosis not present

## 2016-12-06 DIAGNOSIS — I10 Essential (primary) hypertension: Secondary | ICD-10-CM | POA: Diagnosis not present

## 2016-12-06 DIAGNOSIS — I440 Atrioventricular block, first degree: Secondary | ICD-10-CM | POA: Diagnosis not present

## 2016-12-06 DIAGNOSIS — E119 Type 2 diabetes mellitus without complications: Secondary | ICD-10-CM | POA: Diagnosis not present

## 2016-12-06 DIAGNOSIS — J90 Pleural effusion, not elsewhere classified: Secondary | ICD-10-CM | POA: Diagnosis not present

## 2016-12-06 DIAGNOSIS — I25709 Atherosclerosis of coronary artery bypass graft(s), unspecified, with unspecified angina pectoris: Secondary | ICD-10-CM | POA: Diagnosis not present

## 2016-12-06 DIAGNOSIS — I447 Left bundle-branch block, unspecified: Secondary | ICD-10-CM | POA: Diagnosis not present

## 2016-12-06 DIAGNOSIS — I5022 Chronic systolic (congestive) heart failure: Secondary | ICD-10-CM | POA: Diagnosis not present

## 2016-12-06 DIAGNOSIS — I517 Cardiomegaly: Secondary | ICD-10-CM | POA: Diagnosis not present

## 2016-12-06 DIAGNOSIS — I110 Hypertensive heart disease with heart failure: Secondary | ICD-10-CM | POA: Diagnosis not present

## 2016-12-06 DIAGNOSIS — I255 Ischemic cardiomyopathy: Secondary | ICD-10-CM | POA: Diagnosis not present

## 2016-12-06 DIAGNOSIS — J811 Chronic pulmonary edema: Secondary | ICD-10-CM | POA: Diagnosis not present

## 2016-12-20 DIAGNOSIS — Z7982 Long term (current) use of aspirin: Secondary | ICD-10-CM | POA: Diagnosis not present

## 2016-12-20 DIAGNOSIS — Z4502 Encounter for adjustment and management of automatic implantable cardiac defibrillator: Secondary | ICD-10-CM | POA: Diagnosis not present

## 2017-01-23 DIAGNOSIS — E1151 Type 2 diabetes mellitus with diabetic peripheral angiopathy without gangrene: Secondary | ICD-10-CM | POA: Diagnosis not present

## 2017-01-23 DIAGNOSIS — I5023 Acute on chronic systolic (congestive) heart failure: Secondary | ICD-10-CM | POA: Diagnosis not present

## 2017-01-23 DIAGNOSIS — I259 Chronic ischemic heart disease, unspecified: Secondary | ICD-10-CM | POA: Diagnosis not present

## 2017-01-23 DIAGNOSIS — Z794 Long term (current) use of insulin: Secondary | ICD-10-CM | POA: Diagnosis not present

## 2017-03-20 DIAGNOSIS — Z95810 Presence of automatic (implantable) cardiac defibrillator: Secondary | ICD-10-CM | POA: Diagnosis not present

## 2017-03-20 DIAGNOSIS — Z4502 Encounter for adjustment and management of automatic implantable cardiac defibrillator: Secondary | ICD-10-CM | POA: Diagnosis not present

## 2017-03-20 DIAGNOSIS — I429 Cardiomyopathy, unspecified: Secondary | ICD-10-CM | POA: Diagnosis not present

## 2017-04-23 DIAGNOSIS — R0609 Other forms of dyspnea: Secondary | ICD-10-CM | POA: Diagnosis not present

## 2017-04-23 DIAGNOSIS — J209 Acute bronchitis, unspecified: Secondary | ICD-10-CM | POA: Diagnosis not present

## 2017-04-23 DIAGNOSIS — R05 Cough: Secondary | ICD-10-CM | POA: Diagnosis not present

## 2017-04-23 DIAGNOSIS — I509 Heart failure, unspecified: Secondary | ICD-10-CM | POA: Diagnosis not present

## 2017-04-23 DIAGNOSIS — R0602 Shortness of breath: Secondary | ICD-10-CM | POA: Diagnosis not present

## 2017-05-01 DIAGNOSIS — I5043 Acute on chronic combined systolic (congestive) and diastolic (congestive) heart failure: Secondary | ICD-10-CM | POA: Diagnosis not present

## 2017-05-01 DIAGNOSIS — R0609 Other forms of dyspnea: Secondary | ICD-10-CM | POA: Diagnosis not present

## 2017-05-01 DIAGNOSIS — E1140 Type 2 diabetes mellitus with diabetic neuropathy, unspecified: Secondary | ICD-10-CM | POA: Diagnosis not present

## 2017-05-01 DIAGNOSIS — E1165 Type 2 diabetes mellitus with hyperglycemia: Secondary | ICD-10-CM | POA: Diagnosis not present

## 2017-06-11 DIAGNOSIS — N400 Enlarged prostate without lower urinary tract symptoms: Secondary | ICD-10-CM | POA: Diagnosis not present

## 2017-06-11 DIAGNOSIS — I5043 Acute on chronic combined systolic (congestive) and diastolic (congestive) heart failure: Secondary | ICD-10-CM | POA: Diagnosis not present

## 2017-06-11 DIAGNOSIS — E1140 Type 2 diabetes mellitus with diabetic neuropathy, unspecified: Secondary | ICD-10-CM | POA: Diagnosis not present

## 2017-06-11 DIAGNOSIS — E1165 Type 2 diabetes mellitus with hyperglycemia: Secondary | ICD-10-CM | POA: Diagnosis not present

## 2017-06-19 DIAGNOSIS — Z45018 Encounter for adjustment and management of other part of cardiac pacemaker: Secondary | ICD-10-CM | POA: Diagnosis not present

## 2017-06-19 DIAGNOSIS — Z4502 Encounter for adjustment and management of automatic implantable cardiac defibrillator: Secondary | ICD-10-CM | POA: Diagnosis not present

## 2017-07-01 DIAGNOSIS — Z23 Encounter for immunization: Secondary | ICD-10-CM | POA: Diagnosis not present

## 2017-07-01 DIAGNOSIS — I5043 Acute on chronic combined systolic (congestive) and diastolic (congestive) heart failure: Secondary | ICD-10-CM | POA: Diagnosis not present

## 2017-07-01 DIAGNOSIS — E1140 Type 2 diabetes mellitus with diabetic neuropathy, unspecified: Secondary | ICD-10-CM | POA: Diagnosis not present

## 2017-07-01 DIAGNOSIS — N400 Enlarged prostate without lower urinary tract symptoms: Secondary | ICD-10-CM | POA: Diagnosis not present

## 2017-07-05 DIAGNOSIS — I5022 Chronic systolic (congestive) heart failure: Secondary | ICD-10-CM | POA: Diagnosis not present

## 2017-07-05 DIAGNOSIS — I255 Ischemic cardiomyopathy: Secondary | ICD-10-CM | POA: Diagnosis not present

## 2017-07-05 DIAGNOSIS — I10 Essential (primary) hypertension: Secondary | ICD-10-CM | POA: Diagnosis not present

## 2017-07-05 DIAGNOSIS — I110 Hypertensive heart disease with heart failure: Secondary | ICD-10-CM | POA: Diagnosis not present

## 2017-07-05 DIAGNOSIS — I2510 Atherosclerotic heart disease of native coronary artery without angina pectoris: Secondary | ICD-10-CM | POA: Diagnosis not present

## 2017-07-05 DIAGNOSIS — Z951 Presence of aortocoronary bypass graft: Secondary | ICD-10-CM | POA: Diagnosis not present

## 2017-07-05 DIAGNOSIS — R0600 Dyspnea, unspecified: Secondary | ICD-10-CM | POA: Diagnosis not present

## 2017-07-05 DIAGNOSIS — E782 Mixed hyperlipidemia: Secondary | ICD-10-CM | POA: Diagnosis not present

## 2017-07-05 DIAGNOSIS — Z95810 Presence of automatic (implantable) cardiac defibrillator: Secondary | ICD-10-CM | POA: Diagnosis not present

## 2017-07-05 DIAGNOSIS — I350 Nonrheumatic aortic (valve) stenosis: Secondary | ICD-10-CM | POA: Diagnosis not present

## 2017-07-05 DIAGNOSIS — I440 Atrioventricular block, first degree: Secondary | ICD-10-CM | POA: Diagnosis not present

## 2017-07-05 DIAGNOSIS — Z955 Presence of coronary angioplasty implant and graft: Secondary | ICD-10-CM | POA: Diagnosis not present

## 2017-07-29 DIAGNOSIS — E113293 Type 2 diabetes mellitus with mild nonproliferative diabetic retinopathy without macular edema, bilateral: Secondary | ICD-10-CM | POA: Diagnosis not present

## 2017-08-23 DIAGNOSIS — M549 Dorsalgia, unspecified: Secondary | ICD-10-CM | POA: Diagnosis not present

## 2017-08-23 DIAGNOSIS — E1165 Type 2 diabetes mellitus with hyperglycemia: Secondary | ICD-10-CM | POA: Diagnosis not present

## 2017-08-23 DIAGNOSIS — E1140 Type 2 diabetes mellitus with diabetic neuropathy, unspecified: Secondary | ICD-10-CM | POA: Diagnosis not present

## 2017-08-23 DIAGNOSIS — I5043 Acute on chronic combined systolic (congestive) and diastolic (congestive) heart failure: Secondary | ICD-10-CM | POA: Diagnosis not present

## 2017-08-23 DIAGNOSIS — N400 Enlarged prostate without lower urinary tract symptoms: Secondary | ICD-10-CM | POA: Diagnosis not present

## 2017-09-05 ENCOUNTER — Encounter (HOSPITAL_COMMUNITY)
Admission: EM | Disposition: A | Discharge status: Nursing Home (Includes ICF) | Payer: Self-pay | Source: Home / Self Care | Attending: Internal Medicine

## 2017-09-05 ENCOUNTER — Encounter (HOSPITAL_COMMUNITY): Payer: Self-pay | Admitting: Emergency Medicine

## 2017-09-05 ENCOUNTER — Inpatient Hospital Stay (HOSPITAL_COMMUNITY): Payer: Medicare Other

## 2017-09-05 ENCOUNTER — Other Ambulatory Visit: Payer: Self-pay

## 2017-09-05 ENCOUNTER — Emergency Department (HOSPITAL_COMMUNITY): Payer: Medicare Other

## 2017-09-05 ENCOUNTER — Inpatient Hospital Stay (HOSPITAL_COMMUNITY)
Admission: EM | Admit: 2017-09-05 | Discharge: 2017-09-23 | DRG: 004 | Disposition: A | Discharge status: Other Acute Inpatient Hospital | Payer: Medicare Other | Attending: Internal Medicine | Admitting: Internal Medicine

## 2017-09-05 ENCOUNTER — Other Ambulatory Visit (HOSPITAL_COMMUNITY): Payer: Medicare Other

## 2017-09-05 ENCOUNTER — Ambulatory Visit (HOSPITAL_COMMUNITY): Admit: 2017-09-05 | Payer: Medicare Other | Admitting: Cardiovascular Disease

## 2017-09-05 DIAGNOSIS — Y92230 Patient room in hospital as the place of occurrence of the external cause: Secondary | ICD-10-CM | POA: Diagnosis not present

## 2017-09-05 DIAGNOSIS — J9690 Respiratory failure, unspecified, unspecified whether with hypoxia or hypercapnia: Secondary | ICD-10-CM | POA: Diagnosis not present

## 2017-09-05 DIAGNOSIS — R918 Other nonspecific abnormal finding of lung field: Secondary | ICD-10-CM | POA: Diagnosis not present

## 2017-09-05 DIAGNOSIS — J9811 Atelectasis: Secondary | ICD-10-CM | POA: Diagnosis present

## 2017-09-05 DIAGNOSIS — E1122 Type 2 diabetes mellitus with diabetic chronic kidney disease: Secondary | ICD-10-CM | POA: Diagnosis present

## 2017-09-05 DIAGNOSIS — J9620 Acute and chronic respiratory failure, unspecified whether with hypoxia or hypercapnia: Secondary | ICD-10-CM | POA: Diagnosis not present

## 2017-09-05 DIAGNOSIS — I878 Other specified disorders of veins: Secondary | ICD-10-CM | POA: Diagnosis present

## 2017-09-05 DIAGNOSIS — E785 Hyperlipidemia, unspecified: Secondary | ICD-10-CM | POA: Diagnosis present

## 2017-09-05 DIAGNOSIS — I469 Cardiac arrest, cause unspecified: Secondary | ICD-10-CM

## 2017-09-05 DIAGNOSIS — R0603 Acute respiratory distress: Secondary | ICD-10-CM

## 2017-09-05 DIAGNOSIS — Z4502 Encounter for adjustment and management of automatic implantable cardiac defibrillator: Secondary | ICD-10-CM | POA: Diagnosis not present

## 2017-09-05 DIAGNOSIS — I481 Persistent atrial fibrillation: Secondary | ICD-10-CM | POA: Diagnosis not present

## 2017-09-05 DIAGNOSIS — N183 Chronic kidney disease, stage 3 (moderate): Secondary | ICD-10-CM | POA: Diagnosis present

## 2017-09-05 DIAGNOSIS — E876 Hypokalemia: Secondary | ICD-10-CM | POA: Diagnosis present

## 2017-09-05 DIAGNOSIS — R0902 Hypoxemia: Secondary | ICD-10-CM | POA: Diagnosis not present

## 2017-09-05 DIAGNOSIS — I25119 Atherosclerotic heart disease of native coronary artery with unspecified angina pectoris: Secondary | ICD-10-CM | POA: Diagnosis not present

## 2017-09-05 DIAGNOSIS — Z978 Presence of other specified devices: Secondary | ICD-10-CM

## 2017-09-05 DIAGNOSIS — Y838 Other surgical procedures as the cause of abnormal reaction of the patient, or of later complication, without mention of misadventure at the time of the procedure: Secondary | ICD-10-CM | POA: Diagnosis not present

## 2017-09-05 DIAGNOSIS — Z7189 Other specified counseling: Secondary | ICD-10-CM

## 2017-09-05 DIAGNOSIS — R339 Retention of urine, unspecified: Secondary | ICD-10-CM

## 2017-09-05 DIAGNOSIS — Z452 Encounter for adjustment and management of vascular access device: Secondary | ICD-10-CM | POA: Diagnosis not present

## 2017-09-05 DIAGNOSIS — I509 Heart failure, unspecified: Secondary | ICD-10-CM | POA: Diagnosis not present

## 2017-09-05 DIAGNOSIS — Z789 Other specified health status: Secondary | ICD-10-CM | POA: Diagnosis not present

## 2017-09-05 DIAGNOSIS — J9601 Acute respiratory failure with hypoxia: Secondary | ICD-10-CM | POA: Diagnosis not present

## 2017-09-05 DIAGNOSIS — I214 Non-ST elevation (NSTEMI) myocardial infarction: Secondary | ICD-10-CM | POA: Diagnosis not present

## 2017-09-05 DIAGNOSIS — R0989 Other specified symptoms and signs involving the circulatory and respiratory systems: Secondary | ICD-10-CM

## 2017-09-05 DIAGNOSIS — I25709 Atherosclerosis of coronary artery bypass graft(s), unspecified, with unspecified angina pectoris: Secondary | ICD-10-CM | POA: Diagnosis present

## 2017-09-05 DIAGNOSIS — N1330 Unspecified hydronephrosis: Secondary | ICD-10-CM | POA: Diagnosis not present

## 2017-09-05 DIAGNOSIS — R000 Tachycardia, unspecified: Secondary | ICD-10-CM | POA: Diagnosis not present

## 2017-09-05 DIAGNOSIS — I2510 Atherosclerotic heart disease of native coronary artery without angina pectoris: Secondary | ICD-10-CM | POA: Diagnosis not present

## 2017-09-05 DIAGNOSIS — J984 Other disorders of lung: Secondary | ICD-10-CM | POA: Diagnosis not present

## 2017-09-05 DIAGNOSIS — K8020 Calculus of gallbladder without cholecystitis without obstruction: Secondary | ICD-10-CM | POA: Diagnosis present

## 2017-09-05 DIAGNOSIS — J9501 Hemorrhage from tracheostomy stoma: Secondary | ICD-10-CM | POA: Diagnosis not present

## 2017-09-05 DIAGNOSIS — E875 Hyperkalemia: Secondary | ICD-10-CM | POA: Diagnosis not present

## 2017-09-05 DIAGNOSIS — I4901 Ventricular fibrillation: Secondary | ICD-10-CM | POA: Diagnosis not present

## 2017-09-05 DIAGNOSIS — Z8249 Family history of ischemic heart disease and other diseases of the circulatory system: Secondary | ICD-10-CM

## 2017-09-05 DIAGNOSIS — I420 Dilated cardiomyopathy: Secondary | ICD-10-CM | POA: Diagnosis not present

## 2017-09-05 DIAGNOSIS — R1310 Dysphagia, unspecified: Secondary | ICD-10-CM | POA: Diagnosis not present

## 2017-09-05 DIAGNOSIS — I5043 Acute on chronic combined systolic (congestive) and diastolic (congestive) heart failure: Secondary | ICD-10-CM | POA: Diagnosis not present

## 2017-09-05 DIAGNOSIS — Z515 Encounter for palliative care: Secondary | ICD-10-CM

## 2017-09-05 DIAGNOSIS — R570 Cardiogenic shock: Secondary | ICD-10-CM | POA: Diagnosis not present

## 2017-09-05 DIAGNOSIS — G9340 Encephalopathy, unspecified: Secondary | ICD-10-CM | POA: Diagnosis not present

## 2017-09-05 DIAGNOSIS — J90 Pleural effusion, not elsewhere classified: Secondary | ICD-10-CM | POA: Diagnosis not present

## 2017-09-05 DIAGNOSIS — I959 Hypotension, unspecified: Secondary | ICD-10-CM | POA: Diagnosis not present

## 2017-09-05 DIAGNOSIS — I272 Pulmonary hypertension, unspecified: Secondary | ICD-10-CM | POA: Diagnosis present

## 2017-09-05 DIAGNOSIS — Z4682 Encounter for fitting and adjustment of non-vascular catheter: Secondary | ICD-10-CM | POA: Diagnosis not present

## 2017-09-05 DIAGNOSIS — I130 Hypertensive heart and chronic kidney disease with heart failure and stage 1 through stage 4 chronic kidney disease, or unspecified chronic kidney disease: Secondary | ICD-10-CM | POA: Diagnosis not present

## 2017-09-05 DIAGNOSIS — R4020 Unspecified coma: Secondary | ICD-10-CM | POA: Diagnosis not present

## 2017-09-05 DIAGNOSIS — J9600 Acute respiratory failure, unspecified whether with hypoxia or hypercapnia: Secondary | ICD-10-CM | POA: Diagnosis not present

## 2017-09-05 DIAGNOSIS — Z7982 Long term (current) use of aspirin: Secondary | ICD-10-CM

## 2017-09-05 DIAGNOSIS — Z833 Family history of diabetes mellitus: Secondary | ICD-10-CM

## 2017-09-05 DIAGNOSIS — E1165 Type 2 diabetes mellitus with hyperglycemia: Secondary | ICD-10-CM | POA: Diagnosis present

## 2017-09-05 DIAGNOSIS — I472 Ventricular tachycardia: Secondary | ICD-10-CM | POA: Diagnosis not present

## 2017-09-05 DIAGNOSIS — E872 Acidosis: Secondary | ICD-10-CM | POA: Diagnosis present

## 2017-09-05 DIAGNOSIS — Z95810 Presence of automatic (implantable) cardiac defibrillator: Secondary | ICD-10-CM

## 2017-09-05 DIAGNOSIS — I361 Nonrheumatic tricuspid (valve) insufficiency: Secondary | ICD-10-CM | POA: Diagnosis not present

## 2017-09-05 DIAGNOSIS — I4581 Long QT syndrome: Secondary | ICD-10-CM | POA: Diagnosis present

## 2017-09-05 DIAGNOSIS — N400 Enlarged prostate without lower urinary tract symptoms: Secondary | ICD-10-CM | POA: Diagnosis present

## 2017-09-05 DIAGNOSIS — I255 Ischemic cardiomyopathy: Secondary | ICD-10-CM | POA: Diagnosis not present

## 2017-09-05 DIAGNOSIS — N171 Acute kidney failure with acute cortical necrosis: Secondary | ICD-10-CM | POA: Diagnosis not present

## 2017-09-05 DIAGNOSIS — Z9911 Dependence on respirator [ventilator] status: Secondary | ICD-10-CM

## 2017-09-05 DIAGNOSIS — Z9289 Personal history of other medical treatment: Secondary | ICD-10-CM | POA: Diagnosis not present

## 2017-09-05 DIAGNOSIS — Z4659 Encounter for fitting and adjustment of other gastrointestinal appliance and device: Secondary | ICD-10-CM

## 2017-09-05 DIAGNOSIS — I513 Intracardiac thrombosis, not elsewhere classified: Secondary | ICD-10-CM | POA: Diagnosis present

## 2017-09-05 DIAGNOSIS — H409 Unspecified glaucoma: Secondary | ICD-10-CM | POA: Diagnosis present

## 2017-09-05 DIAGNOSIS — G931 Anoxic brain damage, not elsewhere classified: Secondary | ICD-10-CM | POA: Diagnosis present

## 2017-09-05 DIAGNOSIS — I252 Old myocardial infarction: Secondary | ICD-10-CM | POA: Diagnosis not present

## 2017-09-05 DIAGNOSIS — J181 Lobar pneumonia, unspecified organism: Secondary | ICD-10-CM | POA: Diagnosis not present

## 2017-09-05 DIAGNOSIS — R34 Anuria and oliguria: Secondary | ICD-10-CM | POA: Diagnosis not present

## 2017-09-05 DIAGNOSIS — T8111XA Postprocedural  cardiogenic shock, initial encounter: Secondary | ICD-10-CM | POA: Diagnosis not present

## 2017-09-05 DIAGNOSIS — N179 Acute kidney failure, unspecified: Secondary | ICD-10-CM | POA: Diagnosis not present

## 2017-09-05 DIAGNOSIS — R51 Headache: Secondary | ICD-10-CM | POA: Diagnosis not present

## 2017-09-05 DIAGNOSIS — E870 Hyperosmolality and hypernatremia: Secondary | ICD-10-CM | POA: Diagnosis not present

## 2017-09-05 DIAGNOSIS — I700 Atherosclerosis of aorta: Secondary | ICD-10-CM | POA: Diagnosis present

## 2017-09-05 DIAGNOSIS — Z794 Long term (current) use of insulin: Secondary | ICD-10-CM

## 2017-09-05 DIAGNOSIS — Z781 Physical restraint status: Secondary | ICD-10-CM

## 2017-09-05 DIAGNOSIS — Z930 Tracheostomy status: Secondary | ICD-10-CM | POA: Diagnosis not present

## 2017-09-05 DIAGNOSIS — Z951 Presence of aortocoronary bypass graft: Secondary | ICD-10-CM

## 2017-09-05 DIAGNOSIS — R0600 Dyspnea, unspecified: Secondary | ICD-10-CM | POA: Diagnosis not present

## 2017-09-05 DIAGNOSIS — Z85118 Personal history of other malignant neoplasm of bronchus and lung: Secondary | ICD-10-CM

## 2017-09-05 DIAGNOSIS — N170 Acute kidney failure with tubular necrosis: Secondary | ICD-10-CM | POA: Diagnosis not present

## 2017-09-05 DIAGNOSIS — R069 Unspecified abnormalities of breathing: Secondary | ICD-10-CM

## 2017-09-05 HISTORY — DX: Cardiac arrhythmia, unspecified: I49.9

## 2017-09-05 HISTORY — DX: Acute myocardial infarction, unspecified: I21.9

## 2017-09-05 HISTORY — DX: Presence of automatic (implantable) cardiac defibrillator: Z95.810

## 2017-09-05 HISTORY — DX: Atherosclerotic heart disease of native coronary artery without angina pectoris: I25.10

## 2017-09-05 HISTORY — DX: Malignant (primary) neoplasm, unspecified: C80.1

## 2017-09-05 LAB — BASIC METABOLIC PANEL
Anion gap: 16 — ABNORMAL HIGH (ref 5–15)
BUN: 23 mg/dL — AB (ref 6–20)
CALCIUM: 7.9 mg/dL — AB (ref 8.9–10.3)
CO2: 20 mmol/L — ABNORMAL LOW (ref 22–32)
CREATININE: 1.42 mg/dL — AB (ref 0.61–1.24)
Chloride: 102 mmol/L (ref 101–111)
GFR calc Af Amer: 59 mL/min — ABNORMAL LOW (ref >60)
GFR, EST NON AFRICAN AMERICAN: 51 mL/min — AB (ref >60)
GLUCOSE: 176 mg/dL — AB (ref 65–99)
Potassium: 3.1 mmol/L — ABNORMAL LOW (ref 3.5–5.1)
SODIUM: 138 mmol/L (ref 135–145)

## 2017-09-05 LAB — CBC WITH DIFFERENTIAL/PLATELET
BASOS ABS: 0 10*3/uL (ref 0.0–0.1)
Basophils Relative: 0 %
EOS ABS: 0.1 10*3/uL (ref 0.0–0.7)
Eosinophils Relative: 1 %
HCT: 47.5 % (ref 39.0–52.0)
Hemoglobin: 15.1 g/dL (ref 13.0–17.0)
Lymphocytes Relative: 13 %
Lymphs Abs: 1.5 10*3/uL (ref 0.7–4.0)
MCH: 26.6 pg (ref 26.0–34.0)
MCHC: 31.8 g/dL (ref 30.0–36.0)
MCV: 83.8 fL (ref 78.0–100.0)
Monocytes Absolute: 0.6 10*3/uL (ref 0.1–1.0)
Monocytes Relative: 5 %
NEUTROS PCT: 81 %
Neutro Abs: 9.1 10*3/uL — ABNORMAL HIGH (ref 1.7–7.7)
PLATELETS: 221 10*3/uL (ref 150–400)
RBC: 5.67 MIL/uL (ref 4.22–5.81)
RDW: 17.2 % — ABNORMAL HIGH (ref 11.5–15.5)
WBC: 11.2 10*3/uL — AB (ref 4.0–10.5)

## 2017-09-05 LAB — POCT I-STAT 3, ART BLOOD GAS (G3+)
ACID-BASE DEFICIT: 14 mmol/L — AB (ref 0.0–2.0)
Bicarbonate: 12.8 mmol/L — ABNORMAL LOW (ref 20.0–28.0)
O2 Saturation: 96 %
PCO2 ART: 30.8 mmHg — AB (ref 32.0–48.0)
PO2 ART: 90 mmHg (ref 83.0–108.0)
TCO2: 14 mmol/L — AB (ref 22–32)
pH, Arterial: 7.212 — ABNORMAL LOW (ref 7.350–7.450)

## 2017-09-05 LAB — I-STAT ARTERIAL BLOOD GAS, ED
ACID-BASE DEFICIT: 8 mmol/L — AB (ref 0.0–2.0)
Bicarbonate: 18.4 mmol/L — ABNORMAL LOW (ref 20.0–28.0)
O2 SAT: 100 %
PCO2 ART: 40.8 mmHg (ref 32.0–48.0)
PH ART: 7.263 — AB (ref 7.350–7.450)
TCO2: 20 mmol/L — ABNORMAL LOW (ref 22–32)
pO2, Arterial: 197 mmHg — ABNORMAL HIGH (ref 83.0–108.0)

## 2017-09-05 LAB — GLUCOSE, CAPILLARY
GLUCOSE-CAPILLARY: 164 mg/dL — AB (ref 65–99)
GLUCOSE-CAPILLARY: 187 mg/dL — AB (ref 65–99)

## 2017-09-05 LAB — BRAIN NATRIURETIC PEPTIDE: B Natriuretic Peptide: 921.9 pg/mL — ABNORMAL HIGH (ref 0.0–100.0)

## 2017-09-05 LAB — COMPREHENSIVE METABOLIC PANEL
ALBUMIN: 2.8 g/dL — AB (ref 3.5–5.0)
ALT: 25 U/L (ref 17–63)
AST: 76 U/L — ABNORMAL HIGH (ref 15–41)
Alkaline Phosphatase: 121 U/L (ref 38–126)
Anion gap: 16 — ABNORMAL HIGH (ref 5–15)
BUN: 23 mg/dL — ABNORMAL HIGH (ref 6–20)
CHLORIDE: 103 mmol/L (ref 101–111)
CO2: 17 mmol/L — AB (ref 22–32)
Calcium: 8.4 mg/dL — ABNORMAL LOW (ref 8.9–10.3)
Creatinine, Ser: 1.31 mg/dL — ABNORMAL HIGH (ref 0.61–1.24)
GFR calc non Af Amer: 56 mL/min — ABNORMAL LOW (ref >60)
GLUCOSE: 160 mg/dL — AB (ref 65–99)
Potassium: 4.1 mmol/L (ref 3.5–5.1)
SODIUM: 136 mmol/L (ref 135–145)
Total Bilirubin: 1.3 mg/dL — ABNORMAL HIGH (ref 0.3–1.2)
Total Protein: 5.5 g/dL — ABNORMAL LOW (ref 6.5–8.1)

## 2017-09-05 LAB — PROTIME-INR
INR: 1.34
INR: 1.38
Prothrombin Time: 16.4 seconds — ABNORMAL HIGH (ref 11.4–15.2)
Prothrombin Time: 16.8 seconds — ABNORMAL HIGH (ref 11.4–15.2)

## 2017-09-05 LAB — TROPONIN I
TROPONIN I: 2.65 ng/mL — AB (ref <0.03)
TROPONIN I: 9.73 ng/mL — AB (ref <0.03)
TROPONIN I: 22.82 ng/mL — AB (ref <0.03)

## 2017-09-05 LAB — URINALYSIS, ROUTINE W REFLEX MICROSCOPIC
BILIRUBIN URINE: NEGATIVE
Glucose, UA: NEGATIVE mg/dL
Ketones, ur: NEGATIVE mg/dL
Leukocytes, UA: NEGATIVE
Nitrite: NEGATIVE
PH: 5 (ref 5.0–8.0)
Protein, ur: 100 mg/dL — AB
SPECIFIC GRAVITY, URINE: 1.019 (ref 1.005–1.030)

## 2017-09-05 LAB — I-STAT CG4 LACTIC ACID, ED: Lactic Acid, Venous: 5.78 mmol/L (ref 0.5–1.9)

## 2017-09-05 LAB — APTT
APTT: 34 s (ref 24–36)
aPTT: 37 seconds — ABNORMAL HIGH (ref 24–36)

## 2017-09-05 LAB — MRSA PCR SCREENING: MRSA BY PCR: NEGATIVE

## 2017-09-05 LAB — I-STAT TROPONIN, ED: Troponin i, poc: 4.05 ng/mL (ref 0.00–0.08)

## 2017-09-05 LAB — CBG MONITORING, ED: Glucose-Capillary: 162 mg/dL — ABNORMAL HIGH (ref 65–99)

## 2017-09-05 SURGERY — LEFT HEART CATH AND CORONARY ANGIOGRAPHY
Anesthesia: LOCAL

## 2017-09-05 SURGERY — Surgical Case
Anesthesia: *Unknown

## 2017-09-05 MED ORDER — FENTANYL CITRATE (PF) 100 MCG/2ML IJ SOLN
100.0000 ug | INTRAMUSCULAR | Status: AC | PRN
  Administered 2017-09-05 (×3): 100 ug via INTRAVENOUS
  Filled 2017-09-05 (×2): qty 2

## 2017-09-05 MED ORDER — FENTANYL CITRATE (PF) 100 MCG/2ML IJ SOLN
100.0000 ug | INTRAMUSCULAR | Status: DC | PRN
  Administered 2017-09-06 (×2): 100 ug via INTRAVENOUS
  Filled 2017-09-05 (×2): qty 2

## 2017-09-05 MED ORDER — MIDAZOLAM HCL 5 MG/5ML IJ SOLN
INTRAMUSCULAR | Status: AC | PRN
  Administered 2017-09-05: 2 mg via INTRAVENOUS

## 2017-09-05 MED ORDER — SODIUM CHLORIDE 0.9 % IV SOLN
INTRAVENOUS | Status: DC | PRN
  Administered 2017-09-08: 16:00:00 via INTRA_ARTERIAL

## 2017-09-05 MED ORDER — EPINEPHRINE PF 1 MG/ML IJ SOLN
0.5000 ug/min | INTRAVENOUS | Status: DC
  Filled 2017-09-05: qty 4

## 2017-09-05 MED ORDER — POTASSIUM CHLORIDE 20 MEQ/15ML (10%) PO SOLN
40.0000 meq | Freq: Once | ORAL | Status: AC
  Administered 2017-09-05: 40 meq
  Filled 2017-09-05: qty 30

## 2017-09-05 MED ORDER — INSULIN ASPART 100 UNIT/ML ~~LOC~~ SOLN
0.0000 [IU] | SUBCUTANEOUS | Status: DC
  Administered 2017-09-05 (×2): 2 [IU] via SUBCUTANEOUS
  Administered 2017-09-05: 3 [IU] via SUBCUTANEOUS
  Administered 2017-09-05: 2 [IU] via SUBCUTANEOUS
  Administered 2017-09-06: 1 [IU] via SUBCUTANEOUS
  Administered 2017-09-06: 2 [IU] via SUBCUTANEOUS
  Administered 2017-09-07: 1 [IU] via SUBCUTANEOUS
  Administered 2017-09-07 (×2): 2 [IU] via SUBCUTANEOUS
  Administered 2017-09-07 – 2017-09-08 (×2): 1 [IU] via SUBCUTANEOUS
  Administered 2017-09-08 (×2): 2 [IU] via SUBCUTANEOUS
  Administered 2017-09-08: 3 [IU] via SUBCUTANEOUS
  Administered 2017-09-08 – 2017-09-09 (×5): 2 [IU] via SUBCUTANEOUS
  Administered 2017-09-09 (×2): 1 [IU] via SUBCUTANEOUS
  Administered 2017-09-10 (×5): 2 [IU] via SUBCUTANEOUS
  Administered 2017-09-10: 3 [IU] via SUBCUTANEOUS
  Administered 2017-09-11: 2 [IU] via SUBCUTANEOUS
  Administered 2017-09-11 (×2): 3 [IU] via SUBCUTANEOUS
  Administered 2017-09-11 (×2): 2 [IU] via SUBCUTANEOUS
  Administered 2017-09-11: 3 [IU] via SUBCUTANEOUS
  Administered 2017-09-11: 1 [IU] via SUBCUTANEOUS
  Administered 2017-09-12: 2 [IU] via SUBCUTANEOUS
  Administered 2017-09-12: 3 [IU] via SUBCUTANEOUS
  Administered 2017-09-12: 2 [IU] via SUBCUTANEOUS
  Administered 2017-09-12 – 2017-09-13 (×3): 3 [IU] via SUBCUTANEOUS
  Administered 2017-09-13: 5 [IU] via SUBCUTANEOUS
  Administered 2017-09-13: 3 [IU] via SUBCUTANEOUS
  Administered 2017-09-13: 2 [IU] via SUBCUTANEOUS
  Administered 2017-09-13 – 2017-09-14 (×5): 3 [IU] via SUBCUTANEOUS
  Administered 2017-09-14 (×3): 2 [IU] via SUBCUTANEOUS
  Administered 2017-09-15 (×2): 3 [IU] via SUBCUTANEOUS
  Administered 2017-09-15: 2 [IU] via SUBCUTANEOUS
  Administered 2017-09-15: 3 [IU] via SUBCUTANEOUS
  Administered 2017-09-15 – 2017-09-16 (×5): 2 [IU] via SUBCUTANEOUS
  Filled 2017-09-05: qty 1

## 2017-09-05 MED ORDER — EPINEPHRINE PF 1 MG/10ML IJ SOSY
PREFILLED_SYRINGE | INTRAMUSCULAR | Status: AC | PRN
  Administered 2017-09-05: 0.5 mg via INTRAVENOUS
  Administered 2017-09-05: 1 via INTRAVENOUS

## 2017-09-05 MED ORDER — MIDAZOLAM HCL 2 MG/2ML IJ SOLN
2.0000 mg | INTRAMUSCULAR | Status: DC | PRN
  Administered 2017-09-05 – 2017-09-06 (×3): 2 mg via INTRAVENOUS
  Filled 2017-09-05 (×3): qty 2

## 2017-09-05 MED ORDER — ORAL CARE MOUTH RINSE
15.0000 mL | OROMUCOSAL | Status: DC
  Administered 2017-09-05 – 2017-09-15 (×99): 15 mL via OROMUCOSAL

## 2017-09-05 MED ORDER — NOREPINEPHRINE BITARTRATE 1 MG/ML IV SOLN
0.0000 ug/min | INTRAVENOUS | Status: AC
  Administered 2017-09-05: 5 ug/min via INTRAVENOUS
  Administered 2017-09-06: 6 ug/min via INTRAVENOUS
  Administered 2017-09-06: 12 ug/min via INTRAVENOUS
  Filled 2017-09-05 (×3): qty 4

## 2017-09-05 MED ORDER — MIDAZOLAM HCL 2 MG/2ML IJ SOLN
2.0000 mg | INTRAMUSCULAR | Status: DC | PRN
  Administered 2017-09-05 (×2): 2 mg via INTRAVENOUS
  Filled 2017-09-05 (×2): qty 2

## 2017-09-05 MED ORDER — SODIUM BICARBONATE 8.4 % IV SOLN
100.0000 meq | Freq: Once | INTRAVENOUS | Status: AC
  Administered 2017-09-05: 100 meq via INTRAVENOUS

## 2017-09-05 MED ORDER — HEPARIN SODIUM (PORCINE) 5000 UNIT/ML IJ SOLN
INTRAMUSCULAR | Status: AC
  Filled 2017-09-05: qty 1

## 2017-09-05 MED ORDER — SODIUM CHLORIDE 0.9 % IV BOLUS (SEPSIS)
1000.0000 mL | Freq: Once | INTRAVENOUS | Status: AC
  Administered 2017-09-05: 1000 mL via INTRAVENOUS

## 2017-09-05 MED ORDER — SODIUM BICARBONATE 8.4 % IV SOLN
INTRAVENOUS | Status: AC
  Filled 2017-09-05: qty 100

## 2017-09-05 MED ORDER — SODIUM BICARBONATE 8.4 % IV SOLN
INTRAVENOUS | Status: DC
  Administered 2017-09-05 – 2017-09-06 (×2): via INTRAVENOUS
  Filled 2017-09-05 (×4): qty 150

## 2017-09-05 MED ORDER — ASPIRIN 300 MG RE SUPP
300.0000 mg | RECTAL | Status: AC
  Administered 2017-09-05: 300 mg via RECTAL
  Filled 2017-09-05: qty 1

## 2017-09-05 MED ORDER — CHLORHEXIDINE GLUCONATE CLOTH 2 % EX PADS
6.0000 | MEDICATED_PAD | Freq: Every day | CUTANEOUS | Status: DC
  Administered 2017-09-06 – 2017-09-11 (×6): 6 via TOPICAL

## 2017-09-05 MED ORDER — CHLORHEXIDINE GLUCONATE 0.12% ORAL RINSE (MEDLINE KIT)
15.0000 mL | Freq: Two times a day (BID) | OROMUCOSAL | Status: DC
  Administered 2017-09-05 – 2017-09-16 (×23): 15 mL via OROMUCOSAL

## 2017-09-05 MED ORDER — SODIUM CHLORIDE 0.9% FLUSH
10.0000 mL | Freq: Two times a day (BID) | INTRAVENOUS | Status: DC
  Administered 2017-09-05: 10 mL
  Administered 2017-09-06: 20 mL
  Administered 2017-09-07 – 2017-09-10 (×8): 10 mL

## 2017-09-05 MED ORDER — EPINEPHRINE PF 1 MG/10ML IJ SOSY
PREFILLED_SYRINGE | INTRAMUSCULAR | Status: AC | PRN
  Administered 2017-09-05: 1 via INTRAVENOUS

## 2017-09-05 MED ORDER — SODIUM CHLORIDE 0.9% FLUSH: 10.0000 mL | INTRAVENOUS | Status: DC | PRN

## 2017-09-05 MED ORDER — POTASSIUM CHLORIDE 10 MEQ/50ML IV SOLN
10.0000 meq | INTRAVENOUS | Status: AC
  Administered 2017-09-05 (×2): 10 meq via INTRAVENOUS
  Filled 2017-09-05: qty 50

## 2017-09-05 MED ORDER — SODIUM CHLORIDE 0.9 % IV SOLN
INTRAVENOUS | Status: DC
  Administered 2017-09-05: 100 mL/h via INTRAVENOUS
  Administered 2017-09-06: 06:00:00 via INTRAVENOUS
  Administered 2017-09-07: 75 mL/h via INTRAVENOUS
  Administered 2017-09-07: 03:00:00 via INTRAVENOUS

## 2017-09-05 MED ORDER — MIDAZOLAM HCL 2 MG/2ML IJ SOLN
INTRAMUSCULAR | Status: AC
  Filled 2017-09-05: qty 2

## 2017-09-05 MED ORDER — FENTANYL CITRATE (PF) 100 MCG/2ML IJ SOLN
INTRAMUSCULAR | Status: AC
  Filled 2017-09-05: qty 2

## 2017-09-05 MED ORDER — PANTOPRAZOLE SODIUM 40 MG IV SOLR
40.0000 mg | Freq: Every day | INTRAVENOUS | Status: DC
  Administered 2017-09-05 – 2017-09-16 (×12): 40 mg via INTRAVENOUS
  Filled 2017-09-05 (×12): qty 40

## 2017-09-05 MED ORDER — EPINEPHRINE PF 1 MG/ML IJ SOLN
0.5000 ug/min | INTRAVENOUS | Status: DC
  Administered 2017-09-05: 0.5 ug/min via INTRAVENOUS
  Administered 2017-09-06: 4 ug/min via INTRAVENOUS
  Administered 2017-09-07: 2 ug/min via INTRAVENOUS
  Administered 2017-09-08: 1 ug/min via INTRAVENOUS
  Filled 2017-09-05 (×4): qty 4

## 2017-09-05 MED ORDER — FENTANYL CITRATE (PF) 100 MCG/2ML IJ SOLN
INTRAMUSCULAR | Status: AC | PRN
  Administered 2017-09-05: 100 ug via INTRAVENOUS

## 2017-09-05 MED ORDER — HEPARIN SODIUM (PORCINE) 5000 UNIT/ML IJ SOLN
5000.0000 [IU] | Freq: Three times a day (TID) | INTRAMUSCULAR | Status: DC
  Administered 2017-09-05 – 2017-09-06 (×3): 5000 [IU] via SUBCUTANEOUS
  Filled 2017-09-05 (×2): qty 1

## 2017-09-05 MED ORDER — POTASSIUM CHLORIDE 10 MEQ/50ML IV SOLN
INTRAVENOUS | Status: AC
  Filled 2017-09-05: qty 50

## 2017-09-05 MED ORDER — SODIUM CHLORIDE 0.9 % IV SOLN
INTRAVENOUS | Status: AC | PRN
  Administered 2017-09-05: 1000 mL via INTRAVENOUS

## 2017-09-05 MED FILL — Medication: Qty: 1 | Status: AC

## 2017-09-05 NOTE — ED Triage Notes (Signed)
Pt arrives from home via [facility] reporting cardiac arrest, witnessed fall.  EMS report family started CPR, report giving 1 round epi, pt in PEA.  EMS reports ROSC after 25 minutes of compressions.  EMS reports giving 100 mL NS and 100 mcg Fentanyl.

## 2017-09-05 NOTE — Progress Notes (Signed)
Pt unavailable  X 2 due to transferring from ED to [facility]; then pt went to CT.

## 2017-09-05 NOTE — Progress Notes (Signed)
Initial referral to [facility] completed, referal # [id number]

## 2017-09-05 NOTE — Progress Notes (Signed)
Patient arrived to [facility] from ED, labs reviewed and potassium level 3.1 from labs drawn at 1244.  Spoke with NP [place] and reported lab value,  New orders received.

## 2017-09-05 NOTE — Progress Notes (Signed)
Spoke with MD Elsworth Soho to clarify orders from patient's ABG, MD clarified order for bicarb drip.  RN also discussed patient's previous potassium lab result and treatment ordered.  OG tube had been clamped for 2 hours after Potassium replaced and then turned back to LIS.  RN concerned that Potassium was not absorbed per color of OG tube output.  MD Elsworth Soho advised OG tube to be to gravity and does not need to be to LIS.  New orders received for IV potassium and MD advised to recheck BMP as scheduled per 36 degree protocol.

## 2017-09-05 NOTE — Progress Notes (Signed)
RT note: RT transported patient on ventilator to 2H01. Vital signs stable and airway in place.

## 2017-09-05 NOTE — Progress Notes (Signed)
Responded to code stemi/post arrest page. Patient still in ED. Per pt daughter patient ate something last night and threw up. This morning patient stood up then fell and became unconscious. Pt is currently intubated and critical.  Escorted family to consult room and accompanied EDP to talk with family. Provided emotional support, listening and presence.  Facilitated information sharing between staff and family. Will follow as needed.   09/05/17 1054  Clinical Encounter Type  Visited With Patient;Family;Health care provider  Visit Type Initial;Spiritual support;Critical Care;ED  Referral From Nurse  Stress Factors  Family Stress Factors Family relationships;Health changes  Cristopher Peru, [facility], Pager [phone]

## 2017-09-05 NOTE — Procedures (Signed)
Arterial Catheter Insertion Procedure Note Mark Martin [id number] [birth year]  Procedure: Insertion of Arterial Catheter  Indications: Blood pressure monitoring and Frequent blood sampling  Procedure Details Consent: Unable to obtain consent because of emergent medical necessity. Time Out: Verified patient identification, verified procedure, site/side was marked, verified correct patient position, special equipment/implants available, medications/allergies/relevent history reviewed, required imaging and test results available.  Performed  Maximum sterile technique was used including antiseptics, cap, gloves, gown, hand hygiene, mask and sheet. Skin prep: Chlorhexidine; local anesthetic administered 20 gauge catheter was inserted into right femoral artery using the Seldinger technique.  Evaluation Blood flow good; BP tracing good. Complications: No apparent complications.   Mark Martin Minor ACNP Maryanna Shape PCCM Pager [phone] till 3 pm If no answer page [phone] 09/05/2017, 11:43 AM

## 2017-09-05 NOTE — ED Notes (Signed)
Ice applied as ordered core temp 35 now per monitor.

## 2017-09-05 NOTE — ED Notes (Signed)
CBG: 162. RN notified.

## 2017-09-05 NOTE — Consult Note (Signed)
Cardiology Consult    Patient ID: Mark Martin MRN: [id number], DOB/AGE: 1956-05-02   Admit date: 09/05/2017 Date of Consult: 09/05/2017  Primary Physician: Garwin Brothers, MD Primary Cardiologist: Otho Perl [facility]) Requesting Provider: Maryan Rued Reason for Consultation: Cardiac Arrest/STEMI   Patient Profile    62 yo male with PMH of CABG x5( '02), ICM, HTN, HL, DM, Lung mass s/p VATS who presented with out of hospital cardiac arrest.   Past Medical History   Past Medical History:  Diagnosis Date  . Ankle injury    RIGHT  . Arthritis   . BPH (benign prostatic hypertrophy)   . Diabetes mellitus without complication    TYPE 2  . Glaucoma   . H/O: knee surgery    2 PINS IN RIGHT KNEE AND IN RIGHT ANKLE  . Hyperlipidemia   . Hypertension   . Mass of lung    RIGHT UPPER LOBE  . 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 Shortness of breath    WITH EXERTION    Past Surgical History:  Procedure Laterality Date  . APPENDECTOMY    . CORONARY ARTERY BYPASS GRAFT     CABG X 5 08/14/2001  . EYE SURGERY Left    CATARACT  . THORACOTOMY/LOBECTOMY Left 06/02/2013   Procedure: THORACOTOMY/LOBECTOMY;  Surgeon: Grace Isaac, MD;  Location: [place];  Service: Thoracic;  Laterality: Left;  Marland Kitchen VIDEO ASSISTED THORACOSCOPY (VATS)/WEDGE RESECTION Left 06/02/2013   Procedure: VIDEO ASSISTED THORACOSCOPY (VATS)/WEDGE RESECTION;  Surgeon: Grace Isaac, MD;  Location: [place];  Service: Thoracic;  Laterality: Left;  Marland Kitchen VIDEO BRONCHOSCOPY N/A 06/02/2013   Procedure: VIDEO BRONCHOSCOPY;  Surgeon: Grace Isaac, MD;  Location: [facility] OR;  Service: Thoracic;  Laterality: N/A;     Allergies  No Known Allergies  History of Present Illness    Mr. Deutscher is a 62 yo male with PMH of CABG x5( '02), ICM, HTN, HL, DM, Lung mass s/p VATS. He is followed by Dr. Otho Perl with [facility]. Last cath in care  everywhere was 2016 that showed patent LIMA--LAD, SVG-- 1 diag and occluded SVG--RPDA, SVG--OM. EF was noted at 30%. Successful PCI to the mLCx. Placed on DAPT with ASA/Brilinta. Also known to have a PPM/ICD. Notes indicate that he has been followed in the AHF clinic at [facility].   Per EMS report he was at home with his wife today when he suddenly collapsed. CPR was started by family and on Fire arrival he was noted to be pulseless and apneic. On EMS arrival noted to be PEA. Given 1 epi. Total down time of around 25 minutes. ROSC obtained prior to arrival at the ED. Initial EKG showed concern for STEMI with ST elevation in anterior leads. Subsequent EKGs showed resolution of these changes. On arrival to the ED, repeat EKG showed paced rhythm. Seen by Dr. Gwenlyn Found and decide was made to not proceed with emergent cath.   Inpatient Medications    . fentaNYL      . midazolam        Family History    Family History  Problem Relation Age of Onset  . Diabetes Mother   . Hypertension Father     Social History    Social History   Socioeconomic History  . Marital status: Married    Spouse name: Not on file  .  Number of children: 3  . Years of education: Not on file  . Highest education level: Not on file  Social Needs  . Financial resource strain: Not on file  . Food insecurity - worry: Not on file  . Food insecurity - inability: Not on file  . Transportation needs - medical: Not on file  . Transportation needs - non-medical: Not on file  Occupational History    Employer: [employer]  Tobacco Use  . Smoking status: Never Smoker  Substance and Sexual Activity  . Alcohol use: No  . Drug use: No  . Sexual activity: Not on file  Other Topics Concern  . Not on file  Social History Narrative  . Not on file     Review of Systems    Obtained from EMS.  All other systems reviewed and are otherwise negative except as noted above.  Physical Exam    SpO2 100 %.  General:  Intubated older male, no purposeful movement. On vent Neuro: Intubated HEENT: Normal  Neck: Supple, mild JVD. Lungs:  Resp regular and unlabored, crackles anteriorly. Heart: RR, distant, faint heart sounds, systolic murmur. Abdomen: Soft, but distended, BS + x 4.  Extremities: No clubbing, cyanosis, but chronic venous changes.   Labs    Troponin (Point of Care Test) No results for input(s): TROPIPOC in the last 72 hours. No results for input(s): CKTOTAL, CKMB, TROPONINI in the last 72 hours. Lab Results  Component Value Date   WBC 9.7 06/06/2013   HGB 11.0 (L) 06/06/2013   HCT 32.4 (L) 06/06/2013   MCV 81.2 06/06/2013   PLT 225 06/06/2013   No results for input(s): NA, K, CL, CO2, BUN, CREATININE, CALCIUM, PROT, BILITOT, ALKPHOS, ALT, AST, GLUCOSE in the last 168 hours.  Invalid input(s): LABALBU No results found for: CHOL, HDL, LDLCALC, TRIG No results found for: [facility]   Radiology Studies    No results found.  ECG & Cardiac Imaging    EKG: V paced (in the ED)  Cath: 8/16  Diagnostic Summary 1. Patent LIMA-LAD 2. Patent SVG-Diagonal. 3. Chronically occluded SVG-OM 4. Chronically occluded SVG-PDA-RPL. 5. Chronic occlusion of mid circumflex (this feeds the viable territory seen by PET). 6. Known ischemic cardiomyopathy with echo EF 30% (an LV gram was not done). Interventional Summary Successful PCI / Xience Drug Eluting Stent (2.5/23 mm) of the mid Circumflex Coronary Artery to treat chronic total occlusion. Interventional Recommendations 1. Continue medical therapy for aggressive CAD risk factor modification. 2. Continue medical therapy for CHF. 3. Exercise for cardiac rehab was advised. 4. Dual antiplatelet therapy without interruption for at least 6 months. Brilinta was added to the patient's aspirin and the dose was decreased to aspirin 81 mg daily.  Signatures  Electronically signed by Bishop Limbo, DO, FACC(Interventional  Physician) on 04/11/2015  17:38  Assessment & Plan    62 yo male with PMH of CABG x5( '02), ICM, HTN, HL, DM, Lung mass s/p VATS who presented with out of hospital cardiac arrest.   1. Cardiac arrest: Witnessed by wife, with bystander CPR. Total down time of 25 minutes, with 1 epi. PEA was initial rhythm. EKG showed resolution of ST changes on 2nd and 3rd EKG while in the field. Currently intubated, no purposeful movement on arrival. Seen by Dr. Gwenlyn Found and decision made to not take for emergent cardiac cath. PCCM to admit. Pulses lost again in the ED. Could consider ischemic evaluation at some point if he recovers.   2. CAD s/p  CABG: Last cath in 2016 noted above with PCI to Lcx, and EF noted at 30%.   3. ICM s/p PPM/ICD (St Jude): Notes indicate he is followed in the AHF clinic through [facility]. Pacer spikes noted on EKG. Last device check showed 6 NSVT episodes, with 1 VF shock back on 11/18.  Barnet Pall, NP-C Pager [phone] 09/05/2017, 10:24 AM

## 2017-09-05 NOTE — Procedures (Signed)
Central Venous Catheter Insertion Procedure Note Khayden Herzberg [id number] 1956/06/26  Procedure: Insertion of Central Venous Catheter Indications: Assessment of intravascular volume, Drug and/or fluid administration and Frequent blood sampling  Procedure Details Consent: Unable to obtain consent because of emergent medical necessity. Time Out: Verified patient identification, verified procedure, site/side was marked, verified correct patient position, special equipment/implants available, medications/allergies/relevent history reviewed, required imaging and test results available.  Performed  Maximum sterile technique was used including antiseptics, cap, gloves, gown, hand hygiene, mask and sheet. Skin prep: Chlorhexidine; local anesthetic administered A antimicrobial bonded/coated triple lumen catheter was placed in the right femoral vein due to emergent situation using the Seldinger technique. Ultrasound guidance used.Yes.   Catheter placed to 20 cm. Blood aspirated via all 3 ports and then flushed x 3. Line sutured x 2 and dressing applied.  Evaluation Blood flow good Complications: No apparent complications Patient did tolerate procedure well. Chest X-ray ordered to verify placement.  Not needed  [facility] Shia Eber ACNP Maryanna Shape PCCM Pager [phone] till 1 pm If no answer page [phone] 09/05/2017, 11:42 AM

## 2017-09-05 NOTE — Plan of Care (Signed)
  Cardiac: Ability to achieve and maintain adequate cardiopulmonary perfusion will improve 09/05/2017 2114 - Progressing by Tish Frederickson, RN   Neurologic: Promote progressive neurologic recovery 09/05/2017 2114 - Not Progressing by Tish Frederickson, RN

## 2017-09-05 NOTE — H&P (Signed)
PULMONARY / CRITICAL CARE MEDICINE   Name: Mark Martin MRN: [id number] DOB: 09-01-1955    ADMISSION DATE:  09/05/2017   REFERRING MD: Emergency department physician  CHIEF COMPLAINT: Cardiac arrest  HISTORY OF PRESENT ILLNESS:    [name] is a 62 year old male with known ischemic cardiomyopathy, ejection fraction less than 20%, with an AICD in place.  Uses his usual state of health until 09/05/2017 at which time he was noted to go down this was witnessed.  Family started CPR EMS arrived and return of spontaneous circulation within 25 minutes with 1 dose of epinephrine.  His AICD was interrogated and showed 25 minutes of VT in PEA.  His AICD did shock him x1. He is transferred to [facility] emergency department he was intubated and started on epinephrine drip.  He was seen by cardiology.  He had multiple episodes of PEA requiring increasing doses of epinephrine. Pulmonary critical care at bedside arterial line and central line were placed into the right femoral artery and vein respectively.  Levophed was added to his pharmaceutical regimen along with amiodarone. He was stabilized in the emergency department he will be placed on hypothermia protocol transferred to the coronary intensive care unit.   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  PAST SURGICAL HISTORY: He  has a past surgical history that includes Coronary artery bypass graft; Appendectomy; Eye surgery (Left); Video bronchoscopy (N/A, 06/02/2013); Video assisted thoracoscopy (vats)/wedge resection (Left, 06/02/2013); and Thoracotomy/lobectomy (Left, 06/02/2013).  No Known Allergies  No current facility-administered medications on file prior to encounter.    Current  Outpatient Medications on File Prior to Encounter  Medication Sig  . acetaminophen (TYLENOL) 325 MG tablet Take 325 mg by mouth daily as needed for pain.  Marland Kitchen albuterol (PROVENTIL HFA;VENTOLIN HFA) 108 (90 BASE) MCG/ACT inhaler Inhale 2 puffs into the lungs every 6 (six) hours as needed for wheezing or shortness of breath.   Marland Kitchen aspirin EC 81 MG tablet Take 81 mg by mouth daily.  Marland Kitchen atorvastatin (LIPITOR) 40 MG tablet Take 40 mg by mouth at bedtime.   . Dutasteride-Tamsulosin HCl (JALYN) 0.5-0.4 MG CAPS Take 1 capsule by mouth at bedtime.  . hydrochlorothiazide (HYDRODIURIL) 25 MG tablet Take 25 mg by mouth daily.   . insulin NPH-regular (NOVOLIN 70/30) (70-30) 100 UNIT/ML injection Inject 45-55 Units into the skin 3 (three) times daily. Inject 55 units every morning, 45 units in the afternoon and 55 units at bedtime  . metFORMIN (GLUCOPHAGE) 500 MG tablet Take 1,000 mg by mouth 2 (two) times daily with a meal.  . metoprolol (LOPRESSOR) 50 MG tablet Take 50 mg by mouth 2 (two) times daily.  . ramipril (ALTACE) 10 MG capsule Take 10 mg by mouth 2 (two) times daily.    FAMILY HISTORY:  His indicated that the status of his mother is unknown. He indicated that the status of his father is unknown.   SOCIAL HISTORY: He  reports that  has never smoked. He does not have any smokeless tobacco history on file. He reports that he does not drink alcohol or use drugs.  REVIEW OF SYSTEMS:   None available  SUBJECTIVE:  62 year old post cardiac arrest on full ventilatory support and life support  VITAL SIGNS: BP (!) 78/65   Pulse Marland Kitchen)  108   Temp (!) 97.3 F (36.3 C) (Axillary)   Resp (!) 23   Ht 5\' 6"  (1.676 m)   Wt 88.5 kg (195 lb)   SpO2 96%   BMI 31.47 kg/m   HEMODYNAMICS:    VENTILATOR SETTINGS: Vent Mode: PRVC FiO2 (%):  [100 %] 100 % Set Rate:  [15 bmp] 15 bmp Vt Set:  [510 mL] 510 mL PEEP:  [5 cmH20] 5 cmH20 Plateau Pressure:  [13 cmH20] 13 cmH20  INTAKE / OUTPUT: No  intake/output data recorded.  PHYSICAL EXAMINATION: General: Well-nourished well-developed male Neuro: Withdraws to noxious stimuli. HEENT: Pupils equal reactive at 3 mm, jVD is noted Cardiovascular: Heart sounds are distant Lungs: Decreased breath sounds bilateral AICD is noted in left upper quadrant of the chest left thoracotomy scar is noted Abdomen: Soft tender no bowel sounds Musculoskeletal: Back Skin: Lower extremity edema and venous stasis noted  LABS:  BMET No results for input(s): NA, K, CL, CO2, BUN, CREATININE, GLUCOSE in the last 168 hours.  Electrolytes No results for input(s): CALCIUM, MG, PHOS in the last 168 hours.  CBC No results for input(s): WBC, HGB, HCT, PLT in the last 168 hours.  Coag's No results for input(s): APTT, INR in the last 168 hours.  Sepsis Markers Recent Labs  Lab 09/05/17 1027  LATICACIDVEN 5.78*    ABG No results for input(s): PHART, PCO2ART, PO2ART in the last 168 hours.  Liver Enzymes No results for input(s): AST, ALT, ALKPHOS, BILITOT, ALBUMIN in the last 168 hours.  Cardiac Enzymes No results for input(s): TROPONINI, PROBNP in the last 168 hours.  Glucose No results for input(s): GLUCAP in the last 168 hours.  Imaging Dg Chest Port 1 View  Result Date: 09/05/2017 CLINICAL DATA:  Post CPR EXAM: PORTABLE CHEST 1 VIEW COMPARISON:  04/23/2017 FINDINGS: Endotracheal tube is 6 cm above the carina. NG tube enters the stomach. Left AICD remains in place, unchanged. Prior CABG. Bilateral perihilar opacities, likely mild edema. Cardiomegaly. No visible rib fracture or pneumothorax. IMPRESSION: Perihilar opacities, likely mild edema. Cardiomegaly. Support devices as above. Electronically Signed   By: Rolm Baptise M.D.   On: 09/05/2017 10:44     STUDIES:  09/05/2017 2D echo will be performed>>  CULTURES:   ANTIBIOTICS:   SIGNIFICANT EVENTS: 09/05/2017 VT PEA arrest LINES/TUBES: 09/05/2017 endotracheal tube>> 09/05/2017 right  femoral central line>> 09/05/2017 right femoral arterial line>>  DISCUSSION: [name] is a 62 year old male with known ischemic cardiomyopathy, ejection fraction less than 20%, with an AICD in place.  Uses his usual state of health until 09/05/2017 at which time he was noted to go down this was witnessed.  Family started CPR EMS arrived and return of spontaneous circulation within 25 minutes with 1 dose of epinephrine.  His AICD was interrogated and showed 25 minutes of VT in PEA.  His AICD did shock him x1. He is transferred to [facility] emergency department he was intubated and started on epinephrine drip.  He was seen by cardiology.  He had multiple episodes of PEA requiring increasing doses of epinephrine. Pulmonary critical care at bedside arterial line and central line were placed into the right femoral artery and vein respectively.  Levophed was added to his pharmaceutical regimen along with amiodarone. He was stabilized in the emergency department he will be placed on hypothermia protocol transferred to the coronary intensive care unit.  ASSESSMENT / PLAN:  PULMONARY A: Vent dependent respiratory failure secondary to PEA cardiac arrest History of left adenocarcinoma with  resection in 2014 P:   Vent bundle Rate increased to 28 for presumed acidosis Check ABGs to confirm acidotic state and to influence ventilator settings  CARDIOVASCULAR A:  Status post VT PEA arrest Cardiomyopathy with known ejection fraction less than 20% with AICD in place ST elevation MI Shock per AICD for VT PEA Cardiogenic shock P:  Cardiology consult Amiodarone drip Vasopressors as needed Hypo-thermia protocol  RENAL Lab Results  Component Value Date   CREATININE 1.31 (H) 09/05/2017   CREATININE 0.83 06/08/2013   CREATININE 0.80 06/06/2013   Recent Labs  Lab 09/05/17 1018  K 4.1     A:   Renal insufficiency P:   Final renal functions Replete electrolytes as  needed  GASTROINTESTINAL A:   GI protection P:   PPI  HEMATOLOGIC Recent Labs    09/05/17 1018  HGB 15.1     A:   Anticoagulation per cardiology P:  Per cardiology  INFECTIOUS A:   No overt infectious process P:   Monitor fever and white count curve  ENDOCRINE CBG (last 3)  No results for input(s): GLUCAP in the last 72 hours.    A:   Hyperglycemia P:   Sliding scale insulin  NEUROLOGIC A:   Post PEA arrest does not follow commands P:   RASS goal: 0 Sedation as needed   FAMILY  - Updates: Family updated per MD 09/05/2014  - Inter-disciplinary family meet or Palliative Care meeting due by:  day [place] ACNP Maryanna Shape PCCM Pager [phone] till 1 pm If no answer page [phone] 09/05/2017, 11:00 AM

## 2017-09-05 NOTE — ED Provider Notes (Signed)
[facility] EMERGENCY DEPARTMENT Provider Note   CSN: [id number] Arrival date & time: 09/05/17  1005     History   Chief Complaint Chief Complaint  Patient presents with  . Cardiac Arrest    HPI Chasyn Cinque is a 62 y.o. male.  Patient is a 62 year old male being brought in by EMS today after witnessed arrest at home.  Per family yesterday patient was not feeling great had an episode of vomiting and may be some chest discomfort but this morning seemed normal.  He was standing and suddenly collapsed onto the floor.  Son started CPR and fire was there within 5 minutes and continued CPR.  Paramedics gave 20 minutes of CPR and 1 round of epi with ROSC.  Upon arrival here patient became more hypotensive and coded a second time resulting in 1 round of epi with return of pulses.  Patient was given fentanyl in the field for gagging on the tube.  Patient is currently intubated on the ventilator and has an IO in the right lower extremity and an 18-gauge IV in his upper extremity.  All of these were placed in the field.   The history is provided by the EMS personnel, a relative and medical records. The history is limited by the condition of the patient.    Past Medical History:  Diagnosis Date  . Ankle injury    RIGHT  . Arthritis   . BPH (benign prostatic hypertrophy)   . Diabetes mellitus without complication (HCC)    TYPE 2  . Glaucoma   . H/O: knee surgery    2 PINS IN RIGHT KNEE AND IN RIGHT ANKLE  . Hyperlipidemia   . Hypertension   . Mass of lung    RIGHT UPPER LOBE  . 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 Shortness of breath    WITH EXERTION    Patient Active Problem List   Diagnosis Date Noted  . Cardiac arrest ([place]) 09/05/2017  . 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 Hyperlipidemia   . Diabetes mellitus without complication ([place])   .  Hypertension   . BPH (benign prostatic hypertrophy)   . Adenocarcinoma of left lung [facility])     Past Surgical History:  Procedure Laterality Date  . APPENDECTOMY    . CORONARY ARTERY BYPASS GRAFT     CABG X 5 08/14/2001  . EYE SURGERY Left    CATARACT  . THORACOTOMY/LOBECTOMY Left 06/02/2013   Procedure: THORACOTOMY/LOBECTOMY;  Surgeon: Grace Isaac, MD;  Location: [facility];  Service: Thoracic;  Laterality: Left;  Marland Kitchen VIDEO ASSISTED THORACOSCOPY (VATS)/WEDGE RESECTION Left 06/02/2013   Procedure: VIDEO ASSISTED THORACOSCOPY (VATS)/WEDGE RESECTION;  Surgeon: Grace Isaac, MD;  Location: [facility];  Service: Thoracic;  Laterality: Left;  Marland Kitchen VIDEO BRONCHOSCOPY N/A 06/02/2013   Procedure: VIDEO BRONCHOSCOPY;  Surgeon: Grace Isaac, MD;  Location: [facility] OR;  Service: Thoracic;  Laterality: N/A;       Home Medications    Prior to Admission medications   Medication Sig Start Date End Date Taking? Authorizing Provider  acetaminophen (TYLENOL) 325 MG tablet Take 325 mg by mouth daily as needed for pain.    [provider]  albuterol (PROVENTIL HFA;VENTOLIN HFA) 108 (90 BASE) MCG/ACT inhaler Inhale 2 puffs into the lungs every 6 (six)  hours as needed for wheezing or shortness of breath.     [provider]  aspirin EC 81 MG tablet Take 81 mg by mouth daily.    [provider]  atorvastatin (LIPITOR) 40 MG tablet Take 40 mg by mouth at bedtime.     [provider]  Dutasteride-Tamsulosin HCl (JALYN) 0.5-0.4 MG CAPS Take 1 capsule by mouth at bedtime.    [provider]  hydrochlorothiazide (HYDRODIURIL) 25 MG tablet Take 25 mg by mouth daily.     [provider]  insulin NPH-regular (NOVOLIN 70/30) (70-30) 100 UNIT/ML injection Inject 45-55 Units into the skin 3 (three) times daily. Inject 55 units every morning, 45 units in the afternoon and 55 units at bedtime    [provider]  metFORMIN (GLUCOPHAGE) 500 MG tablet Take 1,000 mg by  mouth 2 (two) times daily with a meal.    [provider]  metoprolol (LOPRESSOR) 50 MG tablet Take 50 mg by mouth 2 (two) times daily.    [provider]  ramipril (ALTACE) 10 MG capsule Take 10 mg by mouth 2 (two) times daily.    [provider]    Family History Family History  Problem Relation Age of Onset  . Diabetes Mother   . Hypertension Father     Social History Social History   Tobacco Use  . Smoking status: Never Smoker  Substance Use Topics  . Alcohol use: No  . Drug use: No     Allergies   Patient has no known allergies.   Review of Systems Review of Systems  Unable to perform ROS: Acuity of condition     Physical Exam Updated Vital Signs BP (!) 70/59   Pulse 93   Temp (!) 97.3 F (36.3 C) (Axillary)   Resp (!) 29   Ht 5\' 6"  (1.676 m)   Wt 88.5 kg (195 lb)   SpO2 98%   BMI 31.47 kg/m   Physical Exam  Constitutional: He appears well-developed and well-nourished. No distress.  HENT:  Head: Normocephalic and atraumatic.  Mouth/Throat: Oropharynx is clear and moist.  No evidence of trauma to the head or face  Eyes: Conjunctivae and EOM are normal.  Pupils are 2 mm and sluggishly reactive bilaterally  Neck: Normal range of motion. Neck supple.  Cardiovascular: Regular rhythm and intact distal pulses. Tachycardia present.  No murmur heard. Pulmonary/Chest: Effort normal and breath sounds normal. No respiratory distress. He has no wheezes. He has no rales.  Sounds are equal bilaterally.  7.0 ET tube present  Abdominal: Soft. He exhibits no distension. There is no tenderness. There is no rebound and no guarding.  Musculoskeletal: Normal range of motion. He exhibits no edema or tenderness.  Skin changes consistent with chronic venous stasis in bilateral lower extremities.  1+ pitting edema to the midshin bilaterally  Neurological:  GCS of 3.  Intubated and sedated  Skin: Skin is warm and dry. No rash noted. No erythema.    Nursing note and vitals reviewed.    ED Treatments / Results  Labs (all labs ordered are listed, but only abnormal results are displayed) Labs Reviewed  BRAIN NATRIURETIC PEPTIDE - Abnormal; Notable for the following components:      Result Value   B Natriuretic Peptide 921.9 (*)    All other components within normal limits  PROTIME-INR - Abnormal; Notable for the following components:   Prothrombin Time 16.4 (*)    All other components within normal limits  APTT -  Abnormal; Notable for the following components:   aPTT 37 (*)    All other components within normal limits  I-STAT TROPONIN, ED - Abnormal; Notable for the following components:   Troponin i, poc 4.05 (*)    All other components within normal limits  I-STAT CG4 LACTIC ACID, ED - Abnormal; Notable for the following components:   Lactic Acid, Venous 5.78 (*)    All other components within normal limits  CBC WITH DIFFERENTIAL/PLATELET  COMPREHENSIVE METABOLIC PANEL  TROPONIN I  TROPONIN I  TROPONIN I  TROPONIN I  BASIC METABOLIC PANEL  BASIC METABOLIC PANEL  BLOOD GAS, ARTERIAL  BLOOD GAS, ARTERIAL  BLOOD GAS, ARTERIAL  I-STAT ARTERIAL BLOOD GAS, ED    EKG  EKG Interpretation  Date/Time:  Thursday September 05 2017 10:11:25 EST Ventricular Rate:  87 PR Interval:    QRS Duration: 173 QT Interval:  455 QTC Calculation: 548 R Axis:   -75 Text Interpretation:  AV dual-paced complexes No further analysis attempted due to paced rhythm Baseline wander in lead(s) V6 Confirmed by Blanchie Dessert ([id number]) on 09/05/2017 10:35:47 AM       Radiology Dg Chest Port 1 View  Result Date: 09/05/2017 CLINICAL DATA:  Post CPR EXAM: PORTABLE CHEST 1 VIEW COMPARISON:  04/23/2017 FINDINGS: Endotracheal tube is 6 cm above the carina. NG tube enters the stomach. Left AICD remains in place, unchanged. Prior CABG. Bilateral perihilar opacities, likely mild edema. Cardiomegaly. No visible rib fracture or pneumothorax. IMPRESSION:  Perihilar opacities, likely mild edema. Cardiomegaly. Support devices as above. Electronically Signed   By: Rolm Baptise M.D.   On: 09/05/2017 10:44    Procedures Procedures (including critical care time)  Medications Ordered in ED Medications  EPINEPHrine (ADRENALIN) 4 mg in dextrose 5 % 250 mL (0.016 mg/mL) infusion (6.5 mcg/min Intravenous Rate/Dose Change 09/05/17 1109)  0.9 %  sodium chloride infusion (not administered)  norepinephrine (LEVOPHED) 4 mg in dextrose 5 % 250 mL (0.016 mg/mL) infusion (not administered)  aspirin suppository 300 mg (not administered)  heparin injection 5,000 Units (not administered)  0.9 %  sodium chloride infusion (not administered)  fentaNYL (SUBLIMAZE) injection 100 mcg (not administered)  fentaNYL (SUBLIMAZE) injection 100 mcg (not administered)  midazolam (VERSED) injection 2 mg (not administered)  midazolam (VERSED) injection 2 mg (not administered)  pantoprazole (PROTONIX) injection 40 mg (not administered)  insulin aspart (novoLOG) injection 0-9 Units (not administered)  EPINEPHrine (ADRENALIN) 1 MG/10ML injection (1 Syringe Intravenous Given 09/05/17 1016)  0.9 %  sodium chloride infusion (1,000 mLs Intravenous New Bag/Given 09/05/17 1019)  sodium chloride 0.9 % bolus 1,000 mL (1,000 mLs Intravenous New Bag/Given 09/05/17 1020)  fentaNYL (SUBLIMAZE) injection ( Intravenous Canceled Entry 09/05/17 1030)  midazolam (VERSED) 5 MG/5ML injection ( Intravenous Canceled Entry 09/05/17 1030)  EPINEPHrine (ADRENALIN) 1 MG/10ML injection (0.5 mg Intravenous Given 09/05/17 1047)     Initial Impression / Assessment and Plan / ED Course  I have reviewed the triage vital signs and the nursing notes.  Pertinent labs & imaging results that were available during my care of the patient were reviewed by me and considered in my medical decision making (see chart for details).     Patient presenting post arrest home with return of spontaneous circulation.  Patient  has a prior history of CABG and defibrillator pacemaker placement.  Family denied any recent infectious symptoms cough or shortness of breath.  Patient did have an episode of vomiting and may be some chest discomfort last night.  Assuming  patient's symptoms are cardiac in nature.  Patient continued to have intermittent hypotensive events that would require epi with return of circulation.  Patient was started on an epinephrine drip.  X-ray shows cardiomegaly and some mild effusions but no evidence of shift, pneumothorax and low suspicion for pericardial effusion resulting in tamponade.  Patient's EKG shows a paced rhythm.  [employer] interrogated the pacemaker which showed atrial flutter with then V. tach and V. fib which was shocked.  Patient is currently on an epinephrine drip with maps in the 40s and 50s.  We will continue to titrate up.  Cardiology is on board.  Patient's initial lactate was 5.7 and troponin was 4.  Critical care is currently handling management.  At this time we will not cooled the patient.  Will start aspirin and heparin.  Of the head first to rule out bleed as patient did fall and hit his head.  CRITICAL CARE Performed by: Lurine Imel Total critical care time: 45 minutes Critical care time was exclusive of separately billable procedures and treating other patients. Critical care was necessary to treat or prevent imminent or life-threatening deterioration. Critical care was time spent personally by me on the following activities: development of treatment plan with patient and/or surrogate as well as nursing, discussions with consultants, evaluation of patient's response to treatment, examination of patient, obtaining history from patient or surrogate, ordering and performing treatments and interventions, ordering and review of laboratory studies, ordering and review of radiographic studies, pulse oximetry and re-evaluation of patient's condition.   Final Clinical Impressions(s) /  ED Diagnoses   Final diagnoses:  Cardiac arrest [facility])  Cardiogenic shock [facility])    ED Discharge Orders    None       Blanchie Dessert, MD 09/05/17 1140

## 2017-09-05 NOTE — ED Notes (Signed)
Pt placed on zoll pads, 12-lead, bp + o2

## 2017-09-06 ENCOUNTER — Inpatient Hospital Stay (HOSPITAL_COMMUNITY): Payer: Medicare Other

## 2017-09-06 DIAGNOSIS — G931 Anoxic brain damage, not elsewhere classified: Secondary | ICD-10-CM

## 2017-09-06 DIAGNOSIS — J9601 Acute respiratory failure with hypoxia: Secondary | ICD-10-CM

## 2017-09-06 DIAGNOSIS — I361 Nonrheumatic tricuspid (valve) insufficiency: Secondary | ICD-10-CM

## 2017-09-06 DIAGNOSIS — R570 Cardiogenic shock: Secondary | ICD-10-CM

## 2017-09-06 LAB — PHOSPHORUS: Phosphorus: 6.6 mg/dL — ABNORMAL HIGH (ref 2.5–4.6)

## 2017-09-06 LAB — POCT I-STAT 3, ART BLOOD GAS (G3+)
Acid-base deficit: 13 mmol/L — ABNORMAL HIGH (ref 0.0–2.0)
Bicarbonate: 13.4 mmol/L — ABNORMAL LOW (ref 20.0–28.0)
O2 Saturation: 99 %
Patient temperature: 35.6
TCO2: 14 mmol/L — ABNORMAL LOW (ref 22–32)
pCO2 arterial: 31.2 mmHg — ABNORMAL LOW (ref 32.0–48.0)
pH, Arterial: 7.234 — ABNORMAL LOW (ref 7.350–7.450)
pO2, Arterial: 144 mmHg — ABNORMAL HIGH (ref 83.0–108.0)

## 2017-09-06 LAB — BASIC METABOLIC PANEL
ANION GAP: 20 — AB (ref 5–15)
ANION GAP: 20 — AB (ref 5–15)
BUN: 30 mg/dL — ABNORMAL HIGH (ref 6–20)
BUN: 33 mg/dL — ABNORMAL HIGH (ref 6–20)
CALCIUM: 8 mg/dL — AB (ref 8.9–10.3)
CO2: 10 mmol/L — AB (ref 22–32)
CO2: 12 mmol/L — AB (ref 22–32)
Calcium: 7.7 mg/dL — ABNORMAL LOW (ref 8.9–10.3)
Chloride: 103 mmol/L (ref 101–111)
Chloride: 104 mmol/L (ref 101–111)
Creatinine, Ser: 2.05 mg/dL — ABNORMAL HIGH (ref 0.61–1.24)
Creatinine, Ser: 2.14 mg/dL — ABNORMAL HIGH (ref 0.61–1.24)
GFR calc Af Amer: 38 mL/min — ABNORMAL LOW (ref >60)
GFR calc non Af Amer: 31 mL/min — ABNORMAL LOW (ref >60)
GFR, EST AFRICAN AMERICAN: 36 mL/min — AB (ref >60)
GFR, EST NON AFRICAN AMERICAN: 33 mL/min — AB (ref >60)
GLUCOSE: 236 mg/dL — AB (ref 65–99)
Glucose, Bld: 188 mg/dL — ABNORMAL HIGH (ref 65–99)
Potassium: 4.3 mmol/L (ref 3.5–5.1)
Potassium: 4.7 mmol/L (ref 3.5–5.1)
Sodium: 133 mmol/L — ABNORMAL LOW (ref 135–145)
Sodium: 136 mmol/L (ref 135–145)

## 2017-09-06 LAB — BLOOD GAS, ARTERIAL
ACID-BASE DEFICIT: 11.5 mmol/L — AB (ref 0.0–2.0)
BICARBONATE: 13.6 mmol/L — AB (ref 20.0–28.0)
Drawn by: 51702
FIO2: 50
LHR: 30 {breaths}/min
O2 Saturation: 98.9 %
PEEP/CPAP: 5 cmH2O
PO2 ART: 179 mmHg — AB (ref 83.0–108.0)
Patient temperature: 98.6
VT: 510 mL
pCO2 arterial: 28.5 mmHg — ABNORMAL LOW (ref 32.0–48.0)
pH, Arterial: 7.301 — ABNORMAL LOW (ref 7.350–7.450)

## 2017-09-06 LAB — CBC
HEMATOCRIT: 52.9 % — AB (ref 39.0–52.0)
Hemoglobin: 17.1 g/dL — ABNORMAL HIGH (ref 13.0–17.0)
MCH: 27.2 pg (ref 26.0–34.0)
MCHC: 32.3 g/dL (ref 30.0–36.0)
MCV: 84.1 fL (ref 78.0–100.0)
Platelets: 248 10*3/uL (ref 150–400)
RBC: 6.29 MIL/uL — ABNORMAL HIGH (ref 4.22–5.81)
RDW: 17.4 % — AB (ref 11.5–15.5)
WBC: 28.6 10*3/uL — AB (ref 4.0–10.5)

## 2017-09-06 LAB — GLUCOSE, CAPILLARY
GLUCOSE-CAPILLARY: 108 mg/dL — AB (ref 65–99)
GLUCOSE-CAPILLARY: 139 mg/dL — AB (ref 65–99)
GLUCOSE-CAPILLARY: 230 mg/dL — AB (ref 65–99)
Glucose-Capillary: 187 mg/dL — ABNORMAL HIGH (ref 65–99)
Glucose-Capillary: 97 mg/dL (ref 65–99)
Glucose-Capillary: 116 mg/dL — ABNORMAL HIGH (ref 65–99)

## 2017-09-06 LAB — ECHOCARDIOGRAM COMPLETE
HEIGHTINCHES: 66 in
Weight: 3262.81 oz

## 2017-09-06 LAB — TROPONIN I
TROPONIN I: 49.82 ng/mL — AB (ref <0.03)
Troponin I: 70.34 ng/mL (ref <0.03)

## 2017-09-06 LAB — MAGNESIUM: Magnesium: 1.8 mg/dL (ref 1.7–2.4)

## 2017-09-06 LAB — HEPARIN LEVEL (UNFRACTIONATED): HEPARIN UNFRACTIONATED: 0.53 [IU]/mL (ref 0.30–0.70)

## 2017-09-06 MED ORDER — AMIODARONE HCL IN DEXTROSE 360-4.14 MG/200ML-% IV SOLN
60.0000 mg/h | INTRAVENOUS | Status: AC
  Administered 2017-09-06 (×2): 60 mg/h via INTRAVENOUS
  Filled 2017-09-06: qty 400

## 2017-09-06 MED ORDER — ASPIRIN 81 MG PO CHEW
81.0000 mg | CHEWABLE_TABLET | Freq: Every day | ORAL | Status: DC
  Administered 2017-09-06 – 2017-09-23 (×17): 81 mg
  Filled 2017-09-06 (×17): qty 1

## 2017-09-06 MED ORDER — AMIODARONE LOAD VIA INFUSION
150.0000 mg | Freq: Once | INTRAVENOUS | Status: AC
  Administered 2017-09-06 (×2): 150 mg via INTRAVENOUS
  Filled 2017-09-06: qty 83.34

## 2017-09-06 MED ORDER — TICAGRELOR 90 MG PO TABS
90.0000 mg | ORAL_TABLET | Freq: Two times a day (BID) | ORAL | Status: DC
  Administered 2017-09-06 – 2017-09-15 (×17): 90 mg
  Filled 2017-09-06 (×18): qty 1

## 2017-09-06 MED ORDER — NOREPINEPHRINE BITARTRATE 1 MG/ML IV SOLN
0.0000 ug/min | INTRAVENOUS | Status: DC
  Administered 2017-09-07: 5 ug/min via INTRAVENOUS
  Filled 2017-09-06: qty 16

## 2017-09-06 MED ORDER — PERFLUTREN LIPID MICROSPHERE
1.0000 mL | INTRAVENOUS | Status: AC | PRN
  Administered 2017-09-06: 2 mL via INTRAVENOUS
  Filled 2017-09-06: qty 10

## 2017-09-06 MED ORDER — AMIODARONE HCL IN DEXTROSE 360-4.14 MG/200ML-% IV SOLN
30.0000 mg/h | INTRAVENOUS | Status: DC
  Administered 2017-09-06 – 2017-09-09 (×7): 30 mg/h via INTRAVENOUS
  Filled 2017-09-06 (×7): qty 200

## 2017-09-06 MED ORDER — HEPARIN (PORCINE) IN NACL 100-0.45 UNIT/ML-% IJ SOLN
800.0000 [IU]/h | INTRAMUSCULAR | Status: DC
  Administered 2017-09-06: 1200 [IU]/h via INTRAVENOUS
  Administered 2017-09-07 – 2017-09-08 (×2): 1000 [IU]/h via INTRAVENOUS
  Administered 2017-09-09 – 2017-09-11 (×3): 1200 [IU]/h via INTRAVENOUS
  Filled 2017-09-06 (×7): qty 250

## 2017-09-06 MED ORDER — PERFLUTREN LIPID MICROSPHERE
INTRAVENOUS | Status: AC
  Filled 2017-09-06: qty 10

## 2017-09-06 MED ORDER — MAGNESIUM SULFATE 2 GM/50ML IV SOLN
2.0000 g | Freq: Once | INTRAVENOUS | Status: AC
  Administered 2017-09-06: 2 g via INTRAVENOUS
  Filled 2017-09-06: qty 50

## 2017-09-06 NOTE — Progress Notes (Signed)
EEG Completed; Results Pending  

## 2017-09-06 NOTE — Procedures (Signed)
ELECTROENCEPHALOGRAM REPORT  Date of Study: 09/06/2017  Patient's Name: Mark Martin MRN: [id number] Date of Birth: 15-Jul-1956  Referring Provider: Dr. Kara Mead  Clinical History: This is a 61 year old man s/p cardiac arrest and hypothermia protocol, now normothermic.  Medications: Prn Fentanyl and Versed  Technical Summary: A multichannel digital EEG recording measured by the international 10-20 system with electrodes applied with paste and impedances below 5000 ohms performed as portable with EKG monitoring in an intubated and unresponsive patient.  Hyperventilation and photic stimulation were not performed.  The digital EEG was referentially recorded, reformatted, and digitally filtered in a variety of bipolar and referential montages for optimal display.   Description: The patient is intubated and unresponsive during the recording. No continuous sedation on board. There is no clear posterior dominant rhythm seen. The background consists of a large amount of diffuse 4-6 Hz theta and 2-3 Hz delta slowing. Normal sleep architecture is not seen. Hyperventilation and photic stimulation were not performed. There were no epileptiform discharges or electrographic seizures seen.    EKG lead was unremarkable.  Impression: This EEG is abnormal due to moderate diffuse background slowing.  Clinical Correlation of the above findings indicates diffuse cerebral dysfunction that is non-specific in etiology and can be seen with hypoxic/ischemic injury, toxic/metabolic encephalopathies, or medication effect.  There were no electrographic seizures in this study. The absence of epileptiform discharges does not rule out a clinical diagnosis of epilepsy.  Clinical correlation is advised.   Ellouise Newer, M.D.

## 2017-09-06 NOTE — Progress Notes (Signed)
ANTICOAGULATION CONSULT NOTE - Initial Consult  Pharmacy Consult for Heparin Indication: chest pain/ACS  No Known Allergies  Patient Measurements: Height: 5\' 6"  (167.6 cm) Weight: 203 lb 14.8 oz (92.5 kg) IBW/kg (Calculated) : 63.8  Vital Signs: Temp: 95.5 F (35.3 C) (01/11 1400) Temp Source: Bladder (01/11 1400) BP: 82/66 (01/11 1400) Pulse Rate: 70 (01/11 1400)  Labs: Recent Labs    09/05/17 1018 09/05/17 1244 09/05/17 1803 09/05/17 2322 09/06/17 0437  HGB 15.1  --   --   --  17.1*  HCT 47.5  --   --   --  52.9*  PLT 221  --   --   --  248  APTT 37*  --  34  --   --   LABPROT 16.4*  --  16.8*  --   --   INR 1.34  --  1.38  --   --   CREATININE 1.31* 1.42*  --  2.05* 2.14*  TROPONINI 2.65* 9.73* 22.82* 49.82* 70.34*    Estimated Creatinine Clearance: 37.6 mL/min (A) (by C-G formula based on SCr of 2.14 mg/dL (H)).   Medical History: Past Medical History:  Diagnosis Date  . Ankle injury    RIGHT  . Arthritis   . BPH (benign prostatic hypertrophy)   . Diabetes mellitus without complication (HCC)    TYPE 2  . Glaucoma   . H/O: knee surgery    2 PINS IN RIGHT KNEE AND IN RIGHT ANKLE  . Hyperlipidemia   . Hypertension   . Mass of lung    RIGHT UPPER LOBE  . 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 Shortness of breath    WITH EXERTION     Assessment: 61yom Hx CAD s/p CABG 2002, ICM with ICD admitted s/p VF arrest per pacemaker interrogation.  Amiodarone drip started in setting of VF, Mag 1.8 replace, K ok.  Troponin bump to 70 plan to start heparin for possible ACS.   Cbc ok.    Goal of Therapy:  Heparin level 0.3-0.7 units/ml Monitor platelets by anticoagulation protocol: Yes   Plan:  D/c sq heparin Heparin drip 1200 uts./hr HL in 6hr Daily HL, CBC  Bonnita Nasuti Pharm.D. CPP, BCPS Clinical Pharmacist [phone] 09/06/2017 2:26 PM

## 2017-09-06 NOTE — Progress Notes (Signed)
Patient has not made minimal to no urine overnight, NP Minor made aware during AM rounds.

## 2017-09-06 NOTE — Consult Note (Signed)
NEURO HOSPITALIST CONSULT NOTE   Requestig physician: Dr. Elsworth Soho   Reason for Consult: Prognostication   History obtained from: Chart HPI:                                                                                                                                          Mark Martin is an 61 y.o. male with known dilated cardiomyopathy, pacemaker/ICD, aortic stenosis, CABG in 2002 and stent to the circumflex in 2016 who was admitted on 1/10 for PEA arrest with a downtime of 20 minutes prior to ROSC.  Initial impression was that patient  had a cardiac arrest due to V. tach/V. fib with underlying dilated cardiomyopathy.  Patient was placed on 36 degree protocol.  Blood pressures while he was on hypothermic protocol or very low with initial blood pressure of 82/67 waxing and waning up to 146/101, and as low as 58/44.  During the time of 1107 10/08/1945 patient's blood pressure was 74/59-94/77.  Patient was changed to normothermia protocol today on 09/06/2017.  Today EEG was obtained results pending.   While being in the hospital patient's creatinine has slowly increased from 1.13-2.14,  AST 76, ALT 25,  WBC currently is at 28.5. CT head did not show any intracranial abnormalities including stroke, aneurysm, or intracranial bleed.  Plan is to re-warm at 1500 hours today. HE is non no sedation or paralytics  As this is a patient undergoing hypothermia protocol at 36 degrees that is now on requiring an epi drip to maintain BP as well as norepi.  He is not paralyzed but neuro exam per [facility] M is relatively flat.  This reason neurology was consulted for prognostication.       Past Medical History:  Diagnosis Date  . Ankle injury    RIGHT  . Arthritis   . BPH (benign prostatic hypertrophy)   . Diabetes mellitus without complication (HCC)    TYPE 2  . Glaucoma   . H/O: knee surgery    2 PINS IN RIGHT KNEE AND IN RIGHT ANKLE  . Hyperlipidemia   . Hypertension   .  Mass of lung    RIGHT UPPER LOBE  . 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 Shortness of breath    WITH EXERTION    Past Surgical History:  Procedure Laterality Date  . APPENDECTOMY    . CORONARY ARTERY BYPASS GRAFT     CABG X 5 08/14/2001  . EYE SURGERY Left    CATARACT  . THORACOTOMY/LOBECTOMY Left 06/02/2013   Procedure: THORACOTOMY/LOBECTOMY;  Surgeon: Grace Isaac, MD;  Location: [place];  Service:  Thoracic;  Laterality: Left;  Marland Kitchen VIDEO ASSISTED THORACOSCOPY (VATS)/WEDGE RESECTION Left 06/02/2013   Procedure: VIDEO ASSISTED THORACOSCOPY (VATS)/WEDGE RESECTION;  Surgeon: Grace Isaac, MD;  Location: [place];  Service: Thoracic;  Laterality: Left;  Marland Kitchen VIDEO BRONCHOSCOPY N/A 06/02/2013   Procedure: VIDEO BRONCHOSCOPY;  Surgeon: Grace Isaac, MD;  Location: [facility] OR;  Service: Thoracic;  Laterality: N/A;    Family History  Problem Relation Age of Onset  . Diabetes Mother   . Hypertension Father      Social History:  reports that  has never smoked. He does not have any smokeless tobacco history on file. He reports that he does not drink alcohol or use drugs.  No Known Allergies  MEDICATIONS:                                                                                                                     Scheduled: . chlorhexidine gluconate (MEDLINE KIT)  15 mL Mouth Rinse BID  . Chlorhexidine Gluconate Cloth  6 each Topical Daily  . heparin  5,000 Units Subcutaneous Q8H  . insulin aspart  0-9 Units Subcutaneous Q4H  . mouth rinse  15 mL Mouth Rinse 10 times per day  . pantoprazole (PROTONIX) IV  40 mg Intravenous QHS  . sodium chloride flush  10-40 mL Intracatheter Q12H     ROS:                                                                                                                                       History obtained from unobtainable from patient due to  mental status    Blood pressure 101/82, pulse 80, temperature 97.7 F (36.5 C), temperature source Bladder, resp. rate (!) 29, height '5\' 6"'$  (1.676 m), weight 92.5 kg (203 lb 14.8 oz), SpO2 100 %.   Neurologic Examination:  HEENT-  Normocephalic, no lesions, without obvious abnormality.  Normal external eye and conjunctiva.  Normal TM's bilaterally.  Normal auditory canals and external ears. Normal external nose, mucus membranes and septum.  Normal pharynx. Cardiovascular- S1, S2 normal, pulses palpable throughout   Lungs- chest clear, no wheezing, rales, normal symmetric air entry Abdomen- normal findings: bowel sounds normal Extremities- no edema Lymph-no adenopathy palpable Musculoskeletal-no joint tenderness, deformity or swelling Skin-warm and dry, no hyperpigmentation, vitiligo, or suspicious lesions  Neurological Examination Mental Status: Patient does not respond to verbal stimuli.  Intubated breathing over the vent.  Localizes to sternal rub with hands.  Does not follow commands.    Cranial Nerves: II: patient does not respond confrontation bilaterally, pupils right 2 mm, left 2 mm,and sluggishly reactive bilaterally III,IV,VI: doll's response present--initially only moving to the left and not crossing midline but later when eyelids opened he has disconjugate roving agze.  V,VII: corneal reflex present bilaterally  VIII: patient does not respond to verbal stimuli IX,X: gag reflex present, XI: trapezius strength unable to test bilaterally XII: tongue strength unable to test Motor: Extremities has increased tone throughout with no myoclonus.  No spontaneous movement noted.   Sensory: Does respond to noxious stimuli --sternal rub only no extremities Deep Tendon Reflexes:  Absent throughout. Plantars: absent bilaterally Cerebellar: Unable to perform      Lab  Results: Basic Metabolic Panel: Recent Labs  Lab 09/05/17 1018 09/05/17 1244 09/05/17 2322 09/06/17 0437  NA 136 138 133* 136  K 4.1 3.1* 4.3 4.7  CL 103 102 103 104  CO2 17* 20* 10* 12*  GLUCOSE 160* 176* 236* 188*  BUN 23* 23* 30* 33*  CREATININE 1.31* 1.42* 2.05* 2.14*  CALCIUM 8.4* 7.9* 7.7* 8.0*  MG  --   --   --  1.8  PHOS  --   --   --  6.6*    Liver Function Tests: Recent Labs  Lab 09/05/17 1018  AST 76*  ALT 25  ALKPHOS 121  BILITOT 1.3*  PROT 5.5*  ALBUMIN 2.8*   No results for input(s): LIPASE, AMYLASE in the last 168 hours. No results for input(s): AMMONIA in the last 168 hours.  CBC: Recent Labs  Lab 09/05/17 1018 09/06/17 0437  WBC 11.2* 28.6*  NEUTROABS 9.1*  --   HGB 15.1 17.1*  HCT 47.5 52.9*  MCV 83.8 84.1  PLT 221 248    Cardiac Enzymes: Recent Labs  Lab 09/05/17 1018 09/05/17 1244 09/05/17 1803 09/05/17 2322 09/06/17 0437  TROPONINI 2.65* 9.73* 22.82* 49.82* 70.34*    Lipid Panel: No results for input(s): CHOL, TRIG, HDL, CHOLHDL, VLDL, LDLCALC in the last 168 hours.  CBG: Recent Labs  Lab 09/05/17 1540 09/05/17 2014 09/05/17 2336 09/06/17 0449 09/06/17 0744  GLUCAP 164* 187* 230* 187* 139*    Microbiology: Results for orders placed or performed during the hospital encounter of 09/05/17  MRSA PCR Screening     Status: None   Collection Time: 09/05/17  3:25 PM  Result Value Ref Range Status   MRSA by PCR NEGATIVE NEGATIVE Final    Comment:        The GeneXpert MRSA Assay (FDA approved for NASAL specimens only), is one component of a comprehensive MRSA colonization surveillance program. It is not intended to diagnose MRSA infection nor to guide or monitor treatment for MRSA infections.     Coagulation Studies: Recent Labs    09/05/17 1018 09/05/17 1803  LABPROT 16.4* 16.8*  INR 1.34 1.38  Imaging: Ct Head Wo Contrast  Result Date: 09/05/2017 CLINICAL DATA:  Headache, posttraumatic EXAM: CT HEAD  WITHOUT CONTRAST TECHNIQUE: Contiguous axial images were obtained from the base of the skull through the vertex without intravenous contrast. COMPARISON:  None. FINDINGS: Brain: Mild generalized age related parenchymal atrophy with commensurate dilatation of the ventricles and sulci. Mild chronic small vessel ischemic changes within the bilateral periventricular and subcortical white matter regions. No mass, hemorrhage, edema or other evidence of acute parenchymal abnormality. No extra-axial hemorrhage. Vascular: There are chronic calcified atherosclerotic changes of the large vessels at the skull base. No unexpected hyperdense vessel. Skull: Normal. Negative for fracture or focal lesion. Sinuses/Orbits: No acute finding. Other: None. IMPRESSION: 1. No acute findings. No intracranial mass, hemorrhage or edema. No skull fracture. 2. Mild atrophy and chronic small vessel ischemic changes in the white matter. Electronically Signed   By: Franki Cabot M.D.   On: 09/05/2017 16:24   Dg Chest Port 1 View  Result Date: 09/06/2017 CLINICAL DATA:  Respiratory difficulty EXAM: PORTABLE CHEST 1 VIEW COMPARISON:  09/05/2017 FINDINGS: Endotracheal and NG tubes stable. Left subclavian AICD device and leads are stable. Mediastinal tube is been removed. Vascular congestion without Kerley B lines are stable. Opacity at the left base is stable. No pneumothorax. IMPRESSION: Mediastinal tube removal without pneumothorax. Stable vascular congestion. Electronically Signed   By: Marybelle Killings M.D.   On: 09/06/2017 09:17   Dg Chest Port 1 View  Result Date: 09/05/2017 CLINICAL DATA:  Post CPR EXAM: PORTABLE CHEST 1 VIEW COMPARISON:  04/23/2017 FINDINGS: Endotracheal tube is 6 cm above the carina. NG tube enters the stomach. Left AICD remains in place, unchanged. Prior CABG. Bilateral perihilar opacities, likely mild edema. Cardiomegaly. No visible rib fracture or pneumothorax. IMPRESSION: Perihilar opacities, likely mild edema.  Cardiomegaly. Support devices as above. Electronically Signed   By: Rolm Baptise M.D.   On: 09/05/2017 10:44   EEG: Impression: This EEG is abnormal due to moderate diffuse background slowing.  Clinical Correlation of the above findings indicates diffuse cerebral dysfunction that is non-specific in etiology and can be seen with hypoxic/ischemic injury, toxic/metabolic encephalopathies, or medication effect.  There were no electrographic seizures in this study. The absence of epileptiform discharges does not rule out a clinical diagnosis of epilepsy.  Clinical correlation is advised.      Assessment and plan per attending neurologist  Etta Quill PA-C [facility] [phone]  09/06/2017, 11:44 AM   Assessment/Plan: 63 year old male with persistent encephalopathy following cardiac arrest.  His brainstem is intact, and I think it is too early to say with any degree of certainty what to expect regarding prognosis.  With his left ventricular thrombus, I do think repeating a CT tomorrow to assess for other cortical edema or multifocal infarcts would be prudent.  1) repeat CT in a.m. 2) we will continue to follow  Roland Rack, MD [facility] [phone]  If 7pm- 7am, please page neurology on call as listed in [place].

## 2017-09-06 NOTE — Progress Notes (Signed)
Initial Nutrition Assessment  DOCUMENTATION CODES:   Obesity unspecified  INTERVENTION:   If remains intubated recommend: Vital High Protein @ 35 ml/hr (840 ml/day) 60 ml Prostat BID Provides: 1240 kcal, 133 grams protein, and 702 ml free water   NUTRITION DIAGNOSIS:   Inadequate oral intake related to inability to eat as evidenced by NPO status.  GOAL:   Provide needs based on ASPEN/SCCM guidelines  MONITOR:   I & O's, Vent status  REASON FOR ASSESSMENT:   Ventilator    ASSESSMENT:   Pt with PMH of HTN, DM, ischemic cardiomyopathy, ejection fraction less than 20%, with an AICD in place admitted after PEA cardiac arrest.    UOP: 207 ml x 24 hrs Pt is positive 7 L Pt on no sedation and normotherapy  Patient is currently intubated on ventilator support MV: 15 L/min Temp (24hrs), Avg:96.7 F (35.9 C), Min:94.6 F (34.8 C), Max:99 F (37.2 C)  Medications reviewed and include: epinephrine Labs reviewed: PO4 6.6 (H) CBG's: 187-139    NUTRITION - FOCUSED PHYSICAL EXAM:  Unable to complete Nutrition-Focused physical exam at this time.   Diet Order:  No diet orders on file  EDUCATION NEEDS:   No education needs have been identified at this time  Skin:  Skin Assessment: Reviewed RN Assessment  Last BM:  unknown  Height:   Ht Readings from Last 1 Encounters:  09/05/17 5\' 6"  (1.676 m)    Weight:   Wt Readings from Last 1 Encounters:  09/06/17 203 lb 14.8 oz (92.5 kg)    Ideal Body Weight:  64.5 kg  BMI:  Body mass index is 32.91 kg/m.  Estimated Nutritional Needs:   Kcal:  681-2751  Protein:  >129 grams  Fluid:  > 1.5 L/day   Maylon Peppers RD, LDN, CNSC [phone] Pager [phone] After Hours Pager

## 2017-09-06 NOTE — Progress Notes (Signed)
ANTICOAGULATION CONSULT NOTE - Follow Up Consult  Pharmacy Consult for Heparin Indication: chest pain/ACS  No Known Allergies  Patient Measurements: Height: 5\' 6"  (167.6 cm) Weight: 203 lb 14.8 oz (92.5 kg) IBW/kg (Calculated) : 63.8 Heparin Dosing Weight: 82.7 kg  Vital Signs: Temp: 98.6 F (37 C) (01/11 2000) Temp Source: Bladder (01/11 1900) BP: 92/72 (01/11 2200) Pulse Rate: 73 (01/11 2200)  Labs: Recent Labs    09/05/17 1018 09/05/17 1244 09/05/17 1803 09/05/17 2322 09/06/17 0437 09/06/17 2143  HGB 15.1  --   --   --  17.1*  --   HCT 47.5  --   --   --  52.9*  --   PLT 221  --   --   --  248  --   APTT 37*  --  34  --   --   --   LABPROT 16.4*  --  16.8*  --   --   --   INR 1.34  --  1.38  --   --   --   HEPARINUNFRC  --   --   --   --   --  0.53  CREATININE 1.31* 1.42*  --  2.05* 2.14*  --   TROPONINI 2.65* 9.73* 22.82* 49.82* 70.34*  --     Estimated Creatinine Clearance: 37.6 mL/min (A) (by C-G formula based on SCr of 2.14 mg/dL (H)).   Medications:  Infusions:  . sodium chloride    . sodium chloride 50 mL/hr at 09/06/17 2200  . amiodarone 30 mg/hr (09/06/17 2200)  . epinephrine 3 mcg/min (09/06/17 2200)  . heparin 1,200 Units/hr (09/06/17 2200)  . norepinephrine (LEVOPHED) Adult infusion    .  sodium bicarbonate  infusion 1000 mL 50 mL/hr at 09/06/17 2200    Assessment: 62 year old male on IV heparin for ACS.   Heparin level is therapeutic at 0.53 on 1200 units/hr.  H/H elevated. Platelets are within normal limits.  No bleeding noted.   Goal of Therapy:  Heparin level 0.3-0.7 units/ml Monitor platelets by anticoagulation protocol: Yes   Plan:  Continue Heparin at 1200 units/hr.  Follow-up daily HL and CBC.   Sloan Leiter, PharmD, BCPS, BCCCP Clinical Pharmacist Clinical phone 09/06/2017 until [phone] After hours, please call #[phone] 09/06/2017,10:25 PM

## 2017-09-06 NOTE — Progress Notes (Signed)
Weaned O2 to 30 from 40%

## 2017-09-06 NOTE — Progress Notes (Signed)
MD Sood informed that Patient has had no urine output in the last since 5pm 09/05/16. Bladder scan revealed less than 45ml of urine in bladder. Will continue to monitor

## 2017-09-06 NOTE — Progress Notes (Signed)
Progress Note  Patient Name: Mark Martin Date of Encounter: 09/06/2017  Primary Cardiologist: [facility])  Subjective   Remains intubated, sedated. + corneal and gag reflex per nurse. Patient's daughter at bedside reports he would occasionally skip medications but they're not sure which ones.  Inpatient Medications    Scheduled Meds: . chlorhexidine gluconate (MEDLINE KIT)  15 mL Mouth Rinse BID  . Chlorhexidine Gluconate Cloth  6 each Topical Daily  . heparin  5,000 Units Subcutaneous Q8H  . insulin aspart  0-9 Units Subcutaneous Q4H  . mouth rinse  15 mL Mouth Rinse 10 times per day  . pantoprazole (PROTONIX) IV  40 mg Intravenous QHS  . sodium chloride flush  10-40 mL Intracatheter Q12H   Continuous Infusions: . sodium chloride    . sodium chloride 50 mL/hr at 09/06/17 0622  . amiodarone 60 mg/hr (09/06/17 1228)   Followed by  . amiodarone    . epinephrine 5 mcg/min (09/06/17 1317)  . norepinephrine (LEVOPHED) Adult infusion 5 mcg/min (09/06/17 1317)  .  sodium bicarbonate  infusion 1000 mL 50 mL/hr at 09/05/17 1948   PRN Meds: Place/Maintain arterial line **AND** sodium chloride, fentaNYL (SUBLIMAZE) injection, midazolam, midazolam, sodium chloride flush   Vital Signs    Vitals:   09/06/17 1100 09/06/17 1102 09/06/17 1200 09/06/17 1300  BP: 101/82  100/74 (!) 85/52  Pulse: 80  80 71  Resp: (!) _0 Temp: 97.7 F (36.5 C)  (!) 96.8 F (36 C) (!) 95.7 F (35.4 C)  TempSrc: Bladder  Bladder Bladder  SpO2: 100% 100% 100% 100%  Weight:      Height:        Intake/Output Summary (Last 24 hours) at 09/06/2017 1337 Last data filed at 09/06/2017 1253 Gross per 24 hour  Intake 3506.44 ml  Output 417 ml  Net 3089.44 ml   Filed Weights   09/05/17 1026 09/05/17 1700 09/06/17 0500  Weight: 195 lb (88.5 kg) 197 lb 3.2 oz (89.4 kg) 203 lb 14.8 oz (92.5 kg)    Telemetry    Atrial sensed, ?BIV paced - Personally Reviewed  Physical Exam   GEN: No acute  distress, intubated, sedated HEENT: Normocephalic, atraumatic, sclera non-icteric. Neck: No JVD or bruits. Cardiac: RRR no murmurs, rubs, or gallops.  Radials/DP/PT 1+ and equal bilaterally.  Respiratory: Clear to auscultation bilaterally. Breathing is unlabored. GI: Soft, nontender, non-distended, BS +x 4. MS: no deformity. Extremities: No clubbing or cyanosis. Trace BLE edema. Cool but not cold extremities, pedal pulses faint bilaterally Neuro:  sedated Psych:  Unable to assess given sedation  Labs    Chemistry Recent Labs  Lab 09/05/17 1018 09/05/17 1244 09/05/17 2322 09/06/17 0437  NA 136 138 133* 136  K 4.1 3.1* 4.3 4.7  CL 103 102 103 104  CO2 17* 20* 10* 12*  GLUCOSE 160* 176* 236* 188*  BUN 23* 23* 30* 33*  CREATININE 1.31* 1.42* 2.05* 2.14*  CALCIUM 8.4* 7.9* 7.7* 8.0*  PROT 5.5*  --   --   --   ALBUMIN 2.8*  --   --   --   AST 76*  --   --   --   ALT 25  --   --   --   ALKPHOS 121  --   --   --   BILITOT 1.3*  --   --   --   GFRNONAA 56* 51* 33* 31*  GFRAA >60 59* 38* 36*  ANIONGAP 16* 16*  20* 20*     Hematology Recent Labs  Lab 09/05/17 1018 09/06/17 0437  WBC 11.2* 28.6*  RBC 5.67 6.29*  HGB 15.1 17.1*  HCT 47.5 52.9*  MCV 83.8 84.1  MCH 26.6 27.2  MCHC 31.8 32.3  RDW 17.2* 17.4*  PLT 221 248    Cardiac Enzymes Recent Labs  Lab 09/05/17 1244 09/05/17 1803 09/05/17 2322 09/06/17 0437  TROPONINI 9.73* 22.82* 49.82* 70.34*    Recent Labs  Lab 09/05/17 1025  TROPIPOC 4.05*     BNP Recent Labs  Lab 09/05/17 1018  BNP 921.9*     DDimer No results for input(s): DDIMER in the last 168 hours.   Radiology    Ct Head Wo Contrast  Result Date: 09/05/2017 CLINICAL DATA:  Headache, posttraumatic EXAM: CT HEAD WITHOUT CONTRAST TECHNIQUE: Contiguous axial images were obtained from the base of the skull through the vertex without intravenous contrast. COMPARISON:  None. FINDINGS: Brain: Mild generalized age related parenchymal atrophy  with commensurate dilatation of the ventricles and sulci. Mild chronic small vessel ischemic changes within the bilateral periventricular and subcortical white matter regions. No mass, hemorrhage, edema or other evidence of acute parenchymal abnormality. No extra-axial hemorrhage. Vascular: There are chronic calcified atherosclerotic changes of the large vessels at the skull base. No unexpected hyperdense vessel. Skull: Normal. Negative for fracture or focal lesion. Sinuses/Orbits: No acute finding. Other: None. IMPRESSION: 1. No acute findings. No intracranial mass, hemorrhage or edema. No skull fracture. 2. Mild atrophy and chronic small vessel ischemic changes in the white matter. Electronically Signed   By: Franki Cabot M.D.   On: 09/05/2017 16:24   Dg Chest Port 1 View  Result Date: 09/06/2017 CLINICAL DATA:  Respiratory difficulty EXAM: PORTABLE CHEST 1 VIEW COMPARISON:  09/05/2017 FINDINGS: Endotracheal and NG tubes stable. Left subclavian AICD device and leads are stable. Mediastinal tube is been removed. Vascular congestion without Kerley B lines are stable. Opacity at the left base is stable. No pneumothorax. IMPRESSION: Mediastinal tube removal without pneumothorax. Stable vascular congestion. Electronically Signed   By: Marybelle Killings M.D.   On: 09/06/2017 09:17   Dg Chest Port 1 View  Result Date: 09/05/2017 CLINICAL DATA:  Post CPR EXAM: PORTABLE CHEST 1 VIEW COMPARISON:  04/23/2017 FINDINGS: Endotracheal tube is 6 cm above the carina. NG tube enters the stomach. Left AICD remains in place, unchanged. Prior CABG. Bilateral perihilar opacities, likely mild edema. Cardiomegaly. No visible rib fracture or pneumothorax. IMPRESSION: Perihilar opacities, likely mild edema. Cardiomegaly. Support devices as above. Electronically Signed   By: Rolm Baptise M.D.   On: 09/05/2017 10:44    Cardiac Studies   2D Echo 09/06/17 Study Conclusions  - Left ventricle: No obvious LV thrombus but there is  swirling of   blood in the LV apex which indicates sluggish flow and possible   early forming thrombus. The cavity size was mildly dilated.   Systolic function was severely reduced. The estimated ejection   fraction was 15%. Severe diffuse hypokinesis with distinct   regional wall motion abnormalities. Features are consistent with   a pseudonormal left ventricular filling pattern, with concomitant   abnormal relaxation and increased filling pressure (grade 2   diastolic dysfunction). Doppler parameters are consistent with   high ventricular filling pressure. - Aortic valve: Valve mobility was restricted. There was mild   regurgitation. Valve area (VTI): 1.01 cm^2. Valve area (Vmax):   0.85 cm^2. Valve area (Vmean): 0.85 cm^2. - Pulmonic valve: There was trivial regurgitation. -  Pulmonary arteries: PA peak pressure: 50 mm Hg (S). - Impressions: No obvious LV thrombus but there is swirling of   blood in the LV apex which indicates sluggish flow and possible   early forming thrombus.  Impressions:  - No obvious LV thrombus but there is swirling of blood in the LV   apex which indicates sluggish flow and possible early forming   thrombus. The right ventricular systolic pressure was increased   consistent with moderate pulmonary hypertension.  Patient Profile     62 y.o. male with CABG x5( '02) and DES to mid Cx 2016, ICM, HTN, HL, DM, Lung mass s/p VATS who presented with out of hospital cardiac arrest.   Assessment & Plan    1. Cardiac arrest with VT/VF- per cardiology notes, pt had VT which degenerated to VF at which time device shocked him. He also reportedly had PEA in the ED requiring epinephrine. Inciding event not clear but troponin up to 70 certainly concerning for ischemic event. Unfortunately neuro prognosis is not clear and patient has signs of acute kidney injury, so would not be a great candidate to rush into cath. He was started on amiodarone by PCCM. ? EP eval. Plan to  review with MD. Check thyroid in AM.  2. CAD s/p CABG - profound troponin elevation as above, worrisome for artery closure. He received ASA yesterday but is not currently on any antiplatelet therapy - was on ASA/Brilinta at home but compliance was questionable. Will review further management with MD.   3. Ischemic cardiomyopathy s/p [employer] PPM/ICD with evidence of cardiogenic shock and possible setup for LV thrombus - EF now 15% - currently on pressors guided by PCCM. See above. Neurology plans repeat head CT in AM (cannot pursue MRI due to device). Initial CT head nonacute. Will review anticoag with Dr. Burt Knack.  For questions or updates, please contact [place] Please consult [email or web address] for contact info under Cardiology/STEMI.  Signed, Charlie Pitter, PA-C 09/06/2017, 1:37 PM    Patient seen, examined. Available data reviewed. Agree with findings, assessment, and plan as outlined by Melina Copa, PA-C.   On exam the patient is intubated and comatose.  There is diffuse edema.  JVP is difficult to visualize but appears moderately elevated.  Lung fields are clear anteriorly.  Heart is regular rate and rhythm.  I do not appreciate a murmur.  Abdomen is soft.  Extremities with diffuse 1+ edema.  The feet are cool.  All available data is reviewed.  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  I reviewed the echo findings and prognostic issues with the patient's family at length today.  I think he should be started on IV heparin because of his infarct and very severe LV dysfunction with apical stasis appreciated by echo.  He is at high risk for LV thrombus.  He remains in cardiogenic shock on epinephrine and norepinephrine drips.  They understand that he is critically ill.  We discussed expectations regarding neurologic recovery.  He will begin rewarming later this afternoon.  We will continue to  follow closely with you.  I would continue amiodarone for rhythm management.  Will start heparin.  He will receive aspirin and ticagrelor via tube.  The patient is critically ill with multiple organ systems failure and requires high complexity decision making for assessment and support, frequent evaluation and titration of therapies, application of advanced monitoring technologies and extensive  interpretation of multiple databases.   Critical Care Time devoted to patient care services described in this note is 40 min.  Sherren Mocha, M.D. 09/06/2017 3:10 PM

## 2017-09-06 NOTE — Progress Notes (Signed)
PULMONARY / CRITICAL CARE MEDICINE   Name: Mark Martin MRN: [id number] DOB: 1956-03-09    ADMISSION DATE:  09/05/2017   REFERRING MD: Emergency department physician  CHIEF COMPLAINT: Cardiac arrest  HISTORY OF PRESENT ILLNESS:    Mr.Okun is a 62 year old male with known ischemic cardiomyopathy, ejection fraction less than 20%, with an AICD in place.  Uses his usual state of health until 09/05/2017 at which time he was noted to go down this was witnessed.  Family started CPR EMS arrived and return of spontaneous circulation within 25 minutes with 1 dose of epinephrine.  His AICD was interrogated and showed 25 minutes of VT in PEA.  His AICD did shock him x1. He is transferred to [facility] emergency department he was intubated and started on epinephrine drip.  He was seen by cardiology.  He had multiple episodes of PEA requiring increasing doses of epinephrine. Pulmonary critical care at bedside arterial line and central line were placed into the right femoral artery and vein respectively.  Levophed was added to his pharmaceutical regimen along with amiodarone. He was stabilized in the emergency department he will be placed on hypothermia protocol transferred to the coronary intensive care unit.    SUBJECTIVE:  62 year old post cardiac arrest on full ventilatory support and life support.  Currently on normothermia protocol.  Still requiring small dose of epinephrine to maintain adequate perfusion.  VITAL SIGNS: BP (!) 119/93   Pulse 95   Temp (!) 95.5 F (35.3 C) (Bladder)   Resp 20   Ht 5\' 6"  (1.676 m)   Wt 92.5 kg (203 lb 14.8 oz)   SpO2 100%   BMI 32.91 kg/m   HEMODYNAMICS:    VENTILATOR SETTINGS: Vent Mode: PRVC FiO2 (%):  [40 %-100 %] 40 % Set Rate:  [15 bmp-30 bmp] 30 bmp Vt Set:  [510 mL] 510 mL PEEP:  [5 cmH20] 5 cmH20 Plateau Pressure:  [9 YDX41-28 cmH20] 24 cmH20  INTAKE / OUTPUT: I/O last 3 completed shifts: In: 6663.2 [I.V.:5413.2; NG/GT:150; IV  Piggyback:1100] Out: 407 [Urine:207; Emesis/NG output:200]  PHYSICAL EXAMINATION: General: Elderly male who sedated on ventilator  HEENT: Endotracheal tube to ventilator, orogastric tube to low wall intermittent suction.  Pupils equal reactive PSY: None available Neuro: Moves all extremities x4 spontaneously does not follow commands CV: Heart sounds are regular currently being paced PULM: Coarse rhonchi bilaterally NO:MVEH, non-tender, bsx4 diminished Extremities: warm/dry, 1+ edema, lower extremity venous stasis is noted cold extremities Skin: Cool    LABS:  BMET Recent Labs  Lab 09/05/17 1244 09/05/17 2322 09/06/17 0437  NA 138 133* 136  K 3.1* 4.3 4.7  CL 102 103 104  CO2 20* 10* 12*  BUN 23* 30* 33*  CREATININE 1.42* 2.05* 2.14*  GLUCOSE 176* 236* 188*    Electrolytes Recent Labs  Lab 09/05/17 1244 09/05/17 2322 09/06/17 0437  CALCIUM 7.9* 7.7* 8.0*  MG  --   --  1.8  PHOS  --   --  6.6*    CBC Recent Labs  Lab 09/05/17 1018 09/06/17 0437  WBC 11.2* 28.6*  HGB 15.1 17.1*  HCT 47.5 52.9*  PLT 221 248    Coag's Recent Labs  Lab 09/05/17 1018 09/05/17 1803  APTT 37* 34  INR 1.34 1.38    Sepsis Markers Recent Labs  Lab 09/05/17 1027  LATICACIDVEN 5.78*    ABG Recent Labs  Lab 09/05/17 1656 09/05/17 2337 09/06/17 0508  PHART 7.212* 7.234* 7.301*  PCO2ART 30.8* 31.2* 28.5*  PO2ART 90.0 144.0* 179*    Liver Enzymes Recent Labs  Lab 09/05/17 1018  AST 76*  ALT 25  ALKPHOS 121  BILITOT 1.3*  ALBUMIN 2.8*    Cardiac Enzymes Recent Labs  Lab 09/05/17 1803 09/05/17 2322 09/06/17 0437  TROPONINI 22.82* 49.82* 70.34*    Glucose Recent Labs  Lab 09/05/17 1243 09/05/17 1540 09/05/17 2014 09/05/17 2336 09/06/17 0449 09/06/17 0744  GLUCAP 162* 164* 187* 230* 187* 139*    Imaging Ct Head Wo Contrast  Result Date: 09/05/2017 CLINICAL DATA:  Headache, posttraumatic EXAM: CT HEAD WITHOUT CONTRAST TECHNIQUE: Contiguous  axial images were obtained from the base of the skull through the vertex without intravenous contrast. COMPARISON:  None. FINDINGS: Brain: Mild generalized age related parenchymal atrophy with commensurate dilatation of the ventricles and sulci. Mild chronic small vessel ischemic changes within the bilateral periventricular and subcortical white matter regions. No mass, hemorrhage, edema or other evidence of acute parenchymal abnormality. No extra-axial hemorrhage. Vascular: There are chronic calcified atherosclerotic changes of the large vessels at the skull base. No unexpected hyperdense vessel. Skull: Normal. Negative for fracture or focal lesion. Sinuses/Orbits: No acute finding. Other: None. IMPRESSION: 1. No acute findings. No intracranial mass, hemorrhage or edema. No skull fracture. 2. Mild atrophy and chronic small vessel ischemic changes in the white matter. Electronically Signed   By: Franki Cabot M.D.   On: 09/05/2017 16:24   Dg Chest Port 1 View  Result Date: 09/05/2017 CLINICAL DATA:  Post CPR EXAM: PORTABLE CHEST 1 VIEW COMPARISON:  04/23/2017 FINDINGS: Endotracheal tube is 6 cm above the carina. NG tube enters the stomach. Left AICD remains in place, unchanged. Prior CABG. Bilateral perihilar opacities, likely mild edema. Cardiomegaly. No visible rib fracture or pneumothorax. IMPRESSION: Perihilar opacities, likely mild edema. Cardiomegaly. Support devices as above. Electronically Signed   By: Rolm Baptise M.D.   On: 09/05/2017 10:44     STUDIES:  09/05/2017 2D echo will be performed>> 09/05/2017 CT of the head without acute intracranial abnormality CULTURES:   ANTIBIOTICS:   SIGNIFICANT EVENTS: 09/05/2017 VT PEA arrest LINES/TUBES: 09/05/2017 endotracheal tube>> 09/05/2017 right femoral central line>> 09/05/2017 right femoral arterial line>>  DISCUSSION: Remains on full mechanical ventilatory support, remains on low-dose epinephrine drip.  CT of the head was negative.  Urine  output is low with a bladder scan showing only 20 cc.  Amiodarone to be started today.  ASSESSMENT / PLAN:  PULMONARY A: Vent dependent respiratory failure secondary to PEA cardiac arrest History of left adenocarcinoma with resection in 2014 P:   Vent bundle Rate increased to 28 for presumed acidosis Check ABGs to confirm acidotic state and to influence ventilator settings  CARDIOVASCULAR A:  Status post VT PEA arrest Cardiomyopathy with known ejection fraction less than 20% with AICD in place ST elevation MI Shock per AICD for VT PEA Cardiogenic shock P:  Cardiology consult Amiodarone drip reordered 09/06/2017 Vasopressors as needed for further use of Levophed over epinephrine Hypo-thermia protocol Consider PA catheter placement for definitive monitoring of cardiac function.  RENAL Lab Results  Component Value Date   CREATININE 2.14 (H) 09/06/2017   CREATININE 2.05 (H) 09/05/2017   CREATININE 1.42 (H) 09/05/2017   Recent Labs  Lab 09/05/17 1244 09/05/17 2322 09/06/17 0437  K 3.1* 4.3 4.7     A:   Renal insufficiency Magnesium hypo- Metabolic acidosis P:   Follow renal functions Replete electrolytes as needed CVP per right femoral central line 22 question was accurate but this would  preclude continued volume repletion. Continue to follow renal function no need for CRRT at this time. Sodium bicarbonate drip  GASTROINTESTINAL A:   GI protection P:   PPI  HEMATOLOGIC Recent Labs    09/05/17 1018 09/06/17 0437  HGB 15.1 17.1*     A:   Anticoagulation per cardiology P:  Per cardiology Heparin for DVT prophylaxis  INFECTIOUS A:   No overt infectious process P:   Monitor fever and white count curve  ENDOCRINE CBG (last 3)  Recent Labs    09/05/17 2336 09/06/17 0449 09/06/17 0744  GLUCAP 230* 187* 139*      A:   Hyperglycemia P:   Sliding scale insulin  NEUROLOGIC A:   Post PEA arrest does not follow commands P:   RASS  goal: 0 Sedation as needed CT of the head on 09/05/2017 did not reveal any acute intracranial abnormalities   FAMILY  - Updates: Family updated per [place] NP on 06/2018  - Inter-disciplinary family meet or Palliative Care meeting due by:  day [place] PCCM Pager [phone] till 1 pm If no answer page [phone] 09/06/2017, 9:04 AM  Attending Note:  62 year old male sp 30 minutes cardiac arrest that is undergoing hypothermia protocol at 36 degrees that is now on requiring an epi drip to maintain BP as well as norepi.  He is not paralyzed but neuro exam is relatively flat.  I reviewed CXR myself, ETT is in good position.  Will order EEG.  Neuro consult to be called.  Will continue full vent and hemodynamic support.  Cardiology consult called.  PCCM will follow.  The patient is critically ill with multiple organ systems failure and requires high complexity decision making for assessment and support, frequent evaluation and titration of therapies, application of advanced monitoring technologies and extensive interpretation of multiple databases.   Critical Care Time devoted to patient care services described in this note is  35  Minutes. This time reflects time of care of this signee Dr [name]. This critical care time does not reflect procedure time, or teaching time or supervisory time of PA/NP/Med student/Med Resident etc but could involve care discussion time.  Rush Farmer, M.D. [facility] Pulmonary/Critical Care Medicine. Pager: [phone]. After hours pager: [phone].

## 2017-09-07 ENCOUNTER — Inpatient Hospital Stay (HOSPITAL_COMMUNITY): Payer: Medicare Other

## 2017-09-07 ENCOUNTER — Encounter (HOSPITAL_COMMUNITY): Payer: Self-pay | Admitting: *Deleted

## 2017-09-07 LAB — GLUCOSE, CAPILLARY
GLUCOSE-CAPILLARY: 162 mg/dL — AB (ref 65–99)
Glucose-Capillary: 134 mg/dL — ABNORMAL HIGH (ref 65–99)
Glucose-Capillary: 74 mg/dL (ref 65–99)
Glucose-Capillary: 73 mg/dL (ref 65–99)
Glucose-Capillary: 134 mg/dL — ABNORMAL HIGH (ref 65–99)

## 2017-09-07 LAB — POCT I-STAT 3, ART BLOOD GAS (G3+)
Acid-base deficit: 4 mmol/L — ABNORMAL HIGH (ref 0.0–2.0)
Bicarbonate: 17.8 mmol/L — ABNORMAL LOW (ref 20.0–28.0)
O2 Saturation: 99 %
PCO2 ART: 25.7 mmHg — AB (ref 32.0–48.0)
PH ART: 7.447 (ref 7.350–7.450)
TCO2: 19 mmol/L — ABNORMAL LOW (ref 22–32)
pO2, Arterial: 134 mmHg — ABNORMAL HIGH (ref 83.0–108.0)

## 2017-09-07 LAB — BASIC METABOLIC PANEL
Anion gap: 14 (ref 5–15)
Anion gap: 15 (ref 5–15)
BUN: 48 mg/dL — ABNORMAL HIGH (ref 6–20)
BUN: 56 mg/dL — AB (ref 6–20)
CHLORIDE: 98 mmol/L — AB (ref 101–111)
CO2: 15 mmol/L — AB (ref 22–32)
CO2: 17 mmol/L — ABNORMAL LOW (ref 22–32)
Calcium: 7.6 mg/dL — ABNORMAL LOW (ref 8.9–10.3)
Calcium: 7.5 mg/dL — ABNORMAL LOW (ref 8.9–10.3)
Chloride: 101 mmol/L (ref 101–111)
Creatinine, Ser: 2.7 mg/dL — ABNORMAL HIGH (ref 0.61–1.24)
Creatinine, Ser: 2.76 mg/dL — ABNORMAL HIGH (ref 0.61–1.24)
GFR calc Af Amer: 27 mL/min — ABNORMAL LOW (ref >60)
GFR calc non Af Amer: 24 mL/min — ABNORMAL LOW (ref >60)
GFR, EST AFRICAN AMERICAN: 27 mL/min — AB (ref >60)
GFR, EST NON AFRICAN AMERICAN: 23 mL/min — AB (ref >60)
Glucose, Bld: 154 mg/dL — ABNORMAL HIGH (ref 65–99)
Glucose, Bld: 90 mg/dL (ref 65–99)
POTASSIUM: 5.5 mmol/L — AB (ref 3.5–5.1)
Potassium: 5 mmol/L (ref 3.5–5.1)
SODIUM: 133 mmol/L — AB (ref 135–145)
Sodium: 127 mmol/L — ABNORMAL LOW (ref 135–145)

## 2017-09-07 LAB — HEPARIN LEVEL (UNFRACTIONATED)
HEPARIN UNFRACTIONATED: 0.73 [IU]/mL — AB (ref 0.30–0.70)
HEPARIN UNFRACTIONATED: 0.71 [IU]/mL — AB (ref 0.30–0.70)

## 2017-09-07 LAB — CBC
HEMATOCRIT: 51.2 % (ref 39.0–52.0)
HEMOGLOBIN: 17.6 g/dL — AB (ref 13.0–17.0)
MCH: 27.4 pg (ref 26.0–34.0)
MCHC: 34.4 g/dL (ref 30.0–36.0)
MCV: 79.8 fL (ref 78.0–100.0)
Platelets: 220 10*3/uL (ref 150–400)
RBC: 6.42 MIL/uL — AB (ref 4.22–5.81)
RDW: 17.2 % — ABNORMAL HIGH (ref 11.5–15.5)
WBC: 27 10*3/uL — AB (ref 4.0–10.5)

## 2017-09-07 LAB — PHOSPHORUS: PHOSPHORUS: 5.7 mg/dL — AB (ref 2.5–4.6)

## 2017-09-07 LAB — PROCALCITONIN: Procalcitonin: 7.14 ng/mL

## 2017-09-07 LAB — TSH: TSH: 6.075 u[IU]/mL — ABNORMAL HIGH (ref 0.350–4.500)

## 2017-09-07 LAB — MAGNESIUM: Magnesium: 2.1 mg/dL (ref 1.7–2.4)

## 2017-09-07 MED ORDER — PRO-STAT SUGAR FREE PO LIQD
60.0000 mL | Freq: Two times a day (BID) | ORAL | Status: DC
  Administered 2017-09-07 (×2): 60 mL
  Administered 2017-09-08: 30 mL
  Administered 2017-09-08 – 2017-09-15 (×12): 60 mL
  Filled 2017-09-07 (×15): qty 60

## 2017-09-07 MED ORDER — INFLUENZA VAC SPLIT QUAD 0.5 ML IM SUSY: 0.5000 mL | PREFILLED_SYRINGE | INTRAMUSCULAR | Status: DC | PRN

## 2017-09-07 MED ORDER — VITAL HIGH PROTEIN PO LIQD
1000.0000 mL | ORAL | Status: DC
  Administered 2017-09-07 – 2017-09-12 (×18): 1000 mL
  Filled 2017-09-07 (×2): qty 1000

## 2017-09-07 MED ORDER — FUROSEMIDE 10 MG/ML IJ SOLN: 80.0000 mg | Freq: Once | INTRAMUSCULAR | Status: DC

## 2017-09-07 MED ORDER — FUROSEMIDE 10 MG/ML IJ SOLN
40.0000 mg | Freq: Four times a day (QID) | INTRAMUSCULAR | Status: AC
  Administered 2017-09-07 (×3): 40 mg via INTRAVENOUS
  Filled 2017-09-07 (×3): qty 4

## 2017-09-07 MED ORDER — SODIUM CHLORIDE 0.9 % IV BOLUS (SEPSIS)
1000.0000 mL | Freq: Once | INTRAVENOUS | Status: AC
  Administered 2017-09-07: 1000 mL via INTRAVENOUS

## 2017-09-07 MED ORDER — CEFTRIAXONE SODIUM 1 G IJ SOLR
1.0000 g | INTRAMUSCULAR | Status: DC
  Administered 2017-09-07 – 2017-09-12 (×6): 1 g via INTRAVENOUS
  Filled 2017-09-07 (×7): qty 10

## 2017-09-07 MED ORDER — SODIUM POLYSTYRENE SULFONATE PO POWD
30.0000 g | Freq: Once | ORAL | Status: AC
  Administered 2017-09-07: 30 g
  Filled 2017-09-07: qty 30

## 2017-09-07 NOTE — Progress Notes (Signed)
ANTICOAGULATION CONSULT NOTE - Initial Consult  Pharmacy Consult for Heparin Indication: chest pain/ACS  No Known Allergies  Patient Measurements: Height: 5\' 6"  (167.6 cm) Weight: 214 lb 15.2 oz (97.5 kg) IBW/kg (Calculated) : 63.8  Vital Signs: Temp: 98.1 F (36.7 C) (01/12 0400) Temp Source: Bladder (01/12 0400) BP: 92/73 (01/12 0700) Pulse Rate: 84 (01/12 0700)  Labs: Recent Labs    09/05/17 1018  09/05/17 1803 09/05/17 2322 09/06/17 0437 09/06/17 2143 09/07/17 0342 09/07/17 0650  HGB 15.1  --   --   --  17.1*  --  17.6*  --   HCT 47.5  --   --   --  52.9*  --  51.2  --   PLT 221  --   --   --  248  --  220  --   APTT 37*  --  34  --   --   --   --   --   LABPROT 16.4*  --  16.8*  --   --   --   --   --   INR 1.34  --  1.38  --   --   --   --   --   HEPARINUNFRC  --   --   --   --   --  0.53  --  0.73*  CREATININE 1.31*   < >  --  2.05* 2.14*  --  2.70*  --   TROPONINI 2.65*   < > 22.82* 49.82* 70.34*  --   --   --    < > = values in this interval not displayed.    Estimated Creatinine Clearance: 30.6 mL/min (A) (by C-G formula based on SCr of 2.7 mg/dL (H)).   Medical History: Past Medical History:  Diagnosis Date  . AICD (automatic cardioverter/defibrillator) present   . Ankle injury    RIGHT  . Arthritis   . BPH (benign prostatic hypertrophy)   . Cancer ([place])   . Coronary artery disease   . Diabetes mellitus without complication ([place])    TYPE 2  . Dysrhythmia   . Glaucoma   . H/O: knee surgery    2 PINS IN RIGHT KNEE AND IN RIGHT ANKLE  . Hyperlipidemia   . Hypertension   . Mass of lung    RIGHT UPPER LOBE  . Myocardial infarction ([place])   . 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 Shortness of breath    WITH EXERTION     Assessment: 61yom Hx CAD s/p CABG 2002, ICM with ICD admitted s/p VF arrest per pacemaker interrogation.  Amiodarone  drip started in setting of VF, Mag 1.8 replaced > 2.1, Cr climbing and K 2.2 today start furosemide iv.  Troponin bump to 70  - started heparin for possible ACS.  CBC ok  Patient now TTM and more sensitive to heparin  Heparin drip 1200 uts/hr HL 0.73 < goal -  drawn correctly.   Goal of Therapy:  Heparin level 0.3-0.7 units/ml Monitor platelets by anticoagulation protocol: Yes   Plan:  Decrease Heparin drip 1000 uts./hr HL in 6hr Daily HL, CBC  Bonnita Nasuti Pharm.D. CPP, BCPS Clinical Pharmacist [phone] 09/07/2017 8:03 AM

## 2017-09-07 NOTE — Plan of Care (Signed)
  Progressing Cardiac: Ability to achieve and maintain adequate cardiopulmonary perfusion will improve 09/07/2017 1532 - Progressing by Vickey Sages, RN Neurologic: Promote progressive neurologic recovery 09/07/2017 1532 - Progressing by Vickey Sages, RN Skin Integrity: Risk for impaired skin integrity will be minimized. 09/07/2017 1532 - Progressing by Vickey Sages, RN Education: Ability to manage disease process will improve 09/07/2017 1532 - Progressing by Vickey Sages, RN Health Behavior/Discharge Planning: Ability to safely manage health related needs after discharge will improve 09/07/2017 1532 - Progressing by Vickey Sages, RN Clinical Measurements: Ability to maintain clinical measurements within normal limits will improve 09/07/2017 1532 - Progressing by Vickey Sages, RN Will remain free from infection 09/07/2017 1532 - Progressing by Vickey Sages, RN Diagnostic test results will improve 09/07/2017 1532 - Progressing by Vickey Sages, RN Respiratory complications will improve 09/07/2017 1532 - Progressing by Vickey Sages, RN Cardiovascular complication will be avoided 09/07/2017 1532 - Progressing by Vickey Sages, RN Activity: Risk for activity intolerance will decrease 09/07/2017 1532 - Progressing by Vickey Sages, RN Nutrition: Adequate nutrition will be maintained 09/07/2017 1532 - Progressing by Vickey Sages, RN Coping: Level of anxiety will decrease 09/07/2017 1532 - Progressing by Vickey Sages, RN Elimination: Will not experience complications related to bowel motility 09/07/2017 1532 - Progressing by Vickey Sages, RN Will not experience complications related to urinary retention 09/07/2017 1532 - Progressing by Vickey Sages, RN Pain Managment: General experience of comfort will improve 09/07/2017 1532 - Progressing by Vickey Sages,  RN Safety: Ability to remain free from injury will improve 09/07/2017 1532 - Progressing by Vickey Sages, RN Skin Integrity: Risk for impaired skin integrity will decrease 09/07/2017 1532 - Progressing by Vickey Sages, RN

## 2017-09-07 NOTE — Progress Notes (Signed)
PULMONARY / CRITICAL CARE MEDICINE   Name: Mark Martin MRN: [id number] DOB: 1956-01-01    ADMISSION DATE:  09/05/2017   REFERRING MD: Emergency department physician  CHIEF COMPLAINT: Cardiac arrest  HISTORY OF PRESENT ILLNESS:    Mark Martin is a 62 year old male with known ischemic cardiomyopathy, ejection fraction less than 20%, with an AICD in place.  Uses his usual state of health until 09/05/2017 at which time he was noted to go down this was witnessed.  Family started CPR EMS arrived and return of spontaneous circulation within 25 minutes with 1 dose of epinephrine.  His AICD was interrogated and showed 25 minutes of VT in PEA.  His AICD did shock him x1. He is transferred to [facility] emergency department he was intubated and started on epinephrine drip.  He was seen by cardiology.  He had multiple episodes of PEA requiring increasing doses of epinephrine. Pulmonary critical care at bedside arterial line and central line were placed into the right femoral artery and vein respectively.  Levophed was added to his pharmaceutical regimen along with amiodarone. He was stabilized in the emergency department he will be placed on hypothermia protocol transferred to the coronary intensive care unit.    SUBJECTIVE:  Reaching for ETT overnight but no other events  VITAL SIGNS: BP 92/73   Pulse 84   Temp 98.1 F (36.7 C) (Bladder)   Resp (!) 30   Ht 5\' 6"  (1.676 m)   Wt 97.5 kg (214 lb 15.2 oz)   SpO2 100%   BMI 34.69 kg/m   HEMODYNAMICS: CVP:  [27 mmHg] 27 mmHg  VENTILATOR SETTINGS: Vent Mode: PRVC FiO2 (%):  [30 %-40 %] 30 % Set Rate:  [30 bmp] 30 bmp Vt Set:  [510 mL] 510 mL PEEP:  [5 cmH20] 5 cmH20 Plateau Pressure:  [20 cmH20-23 cmH20] 20 cmH20  INTAKE / OUTPUT: I/O last 3 completed shifts: In: 5197.1 [I.V.:4897.1; NG/GT:150; IV Piggyback:150] Out: 397 [Urine:97; Emesis/NG output:300]  PHYSICAL EXAMINATION: General: Elderly, sedate, on vent HEENT: [place]/AT,  PERRL, EOM-I and MMM PSY: Sedate Neuro: Moves all ext spontaneously but not to command CV: RRR, paced, Nl S1/S2 and -M/R/G. PULM: CTA bilaterally  GI: Soft, NT, ND and +BS Extremities: -edema and -tenderness Skin: Intact  LABS:  BMET Recent Labs  Lab 09/05/17 2322 09/06/17 0437 09/07/17 0342  NA 133* 136 127*  K 4.3 4.7 5.5*  CL 103 104 98*  CO2 10* 12* 15*  BUN 30* 33* 48*  CREATININE 2.05* 2.14* 2.70*  GLUCOSE 236* 188* 154*   Electrolytes Recent Labs  Lab 09/05/17 2322 09/06/17 0437 09/07/17 0342  CALCIUM 7.7* 8.0* 7.6*  MG  --  1.8 2.1  PHOS  --  6.6* 5.7*   CBC Recent Labs  Lab 09/05/17 1018 09/06/17 0437 09/07/17 0342  WBC 11.2* 28.6* 27.0*  HGB 15.1 17.1* 17.6*  HCT 47.5 52.9* 51.2  PLT 221 248 220   Coag's Recent Labs  Lab 09/05/17 1018 09/05/17 1803  APTT 37* 34  INR 1.34 1.38   Sepsis Markers Recent Labs  Lab 09/05/17 1027  LATICACIDVEN 5.78*   ABG Recent Labs  Lab 09/05/17 2337 09/06/17 0508 09/07/17 0357  PHART 7.234* 7.301* 7.447  PCO2ART 31.2* 28.5* 25.7*  PO2ART 144.0* 179* 134.0*   Liver Enzymes Recent Labs  Lab 09/05/17 1018  AST 76*  ALT 25  ALKPHOS 121  BILITOT 1.3*  ALBUMIN 2.8*   Cardiac Enzymes Recent Labs  Lab 09/05/17 1803 09/05/17 2322 09/06/17 0437  TROPONINI 22.82* 49.82* 70.34*   Glucose Recent Labs  Lab 09/06/17 0449 09/06/17 0744 09/06/17 1201 09/06/17 1545 09/06/17 2000 09/07/17 0353  GLUCAP 187* 139* 108* 97 116* 162*   Imaging Ct Head Wo Contrast  Result Date: 09/07/2017 CLINICAL DATA:  Hypoxic ischemic encephalopathy. EXAM: CT HEAD WITHOUT CONTRAST TECHNIQUE: Contiguous axial images were obtained from the base of the skull through the vertex without intravenous contrast. COMPARISON:  CT HEAD September 05, 2017 FINDINGS: Moderately motion degraded examination. BRAIN: No intraparenchymal hemorrhage, mass effect nor midline shift. The ventricles and sulci are normal for age. Patchy  supratentorial white matter hypodensities within normal range for patient's age, though non-specific are most compatible with chronic small vessel ischemic disease. No acute large vascular territory infarcts. No abnormal extra-axial fluid collections. Basal cisterns are patent. VASCULAR: Mild to moderate calcific atherosclerosis of the carotid siphons. SKULL: No skull fracture. No significant scalp soft tissue swelling. SINUSES/ORBITS: Mild LEFT sphenoid sinusitis without air-fluid levels. Mastoid air cells are well aerated. Included ocular globes and orbital contents are non-suspicious. Status post bilateral ocular lens implants. OTHER: Life-support lines in place. IMPRESSION: 1. No acute intracranial process on this moderately motion degraded examination. 2. Mild to moderate chronic small vessel ischemic disease. Electronically Signed   By: Elon Alas M.D.   On: 09/07/2017 06:23   Dg Chest Port 1 View  Result Date: 09/07/2017 CLINICAL DATA:  Respiratory failure. EXAM: PORTABLE CHEST 1 VIEW COMPARISON:  One-view chest x-ray 09/06/2017 FINDINGS: Endotracheal tube and NG tube are stable. Heart is enlarged. Pacing and defibrillator wires are stable. A left pleural effusion and lower lobe airspace disease remains. Mild pulmonary vascular congestion is stable. The right lung is clear. IMPRESSION: 1. Stable left pleural effusion and basilar airspace disease, likely atelectasis. 2. Mild vascular congestion is stable. 3. Support apparatus is stable. Electronically Signed   By: San Morelle M.D.   On: 09/07/2017 07:15   STUDIES:  09/05/2017 2D echo will be performed>> 09/05/2017 CT of the head without acute intracranial abnormality CULTURES: Blood 1/12>>>  ANTIBIOTICS:   SIGNIFICANT EVENTS: 09/05/2017 VT PEA arrest LINES/TUBES: 09/05/2017 endotracheal tube>> 09/05/2017 right femoral central line>> 09/05/2017 right femoral arterial line>>  DISCUSSION: 62 year old with extensive cardiac history  presenting post cardiac arrest.  ASSESSMENT / PLAN:  PULMONARY A: Vent dependent respiratory failure secondary to PEA cardiac arrest History of left adenocarcinoma with resection in 2014 P:   Vent bundle Decrease RR to 18 Adjust vent for ABG Titrate O2 for sat of 8-92%  CARDIOVASCULAR A:  Status post VT PEA arrest Cardiomyopathy with known ejection fraction less than 20% with AICD in place ST elevation MI Shock per AICD for VT PEA Cardiogenic shock P:  Cardiology consult, appreciate input Amiodarone drip reordered 09/06/2017 Levo and Epi, continue titrate for target BP Hypo-thermia protocol  RENAL Lab Results  Component Value Date   CREATININE 2.70 (H) 09/07/2017   CREATININE 2.14 (H) 09/06/2017   CREATININE 2.05 (H) 09/05/2017   Recent Labs  Lab 09/05/17 2322 09/06/17 0437 09/07/17 0342  K 4.3 4.7 5.5*   A:   Renal insufficiency Magnesium hypo- Metabolic acidosis P:   Follow renal functions Replete electrolytes as needed D/C bicarb drip Increase IVF to 75 ml/hr NS Lasix 40 mg IV q6 x3 doses mostly to address hyperkalemia Give a liter of NS IV x1 now  GASTROINTESTINAL A:   GI protection P:   PPI TF per nutrition  HEMATOLOGIC Recent Labs    09/06/17 0437 09/07/17 0342  HGB 17.1* 17.6*   A:   Anticoagulation per cardiology P:  Per cardiology Heparin for DVT prophylaxis  INFECTIOUS A:   No overt infectious process P:   Monitor fever and white count curve Check PCT Blood cultures If PCT is positive will start unasyn  ENDOCRINE CBG (last 3)  Recent Labs    09/06/17 1545 09/06/17 2000 09/07/17 0353  GLUCAP 97 116* 162*   A:   Hyperglycemia P:   Sliding scale insulin  NEUROLOGIC A:   Post PEA arrest does not follow commands P:   RASS goal: 0 Sedation as needed Head CT negative Neuro following  FAMILY  - Updates: No family bedside to update 1/12  - Inter-disciplinary family meet or Palliative Care meeting due by:  day  7  The patient is critically ill with multiple organ systems failure and requires high complexity decision making for assessment and support, frequent evaluation and titration of therapies, application of advanced monitoring technologies and extensive interpretation of multiple databases.   Critical Care Time devoted to patient care services described in this note is  35  Minutes. This time reflects time of care of this signee Dr [name]. This critical care time does not reflect procedure time, or teaching time or supervisory time of PA/NP/Med student/Med Resident etc but could involve care discussion time.  Rush Farmer, M.D. [facility] Pulmonary/Critical Care Medicine. Pager: [phone]. After hours pager: [phone].

## 2017-09-07 NOTE — Progress Notes (Signed)
Subjective: More awake today  Exam: Vitals:   09/07/17 1615 09/07/17 1700  BP:  101/76  Pulse: 72 75  Resp: (!) 24 (!) 24  Temp:    SpO2: 100% 100%   Gen: In bed, NAD Resp: non-labored breathing, no acute distress Abd: soft, nt  Neuro: MS: Opens eyes to voice, fixates and tracks, does not follow commands CN: Blinks to threat bilaterally, pupils equal and reactive Motor: He moves his left arm more than his right, withdraws bilateral legs fairly strongly. Sensory: He does react to noxious stimulation in all 4 extremities  Pertinent Labs: Creatinine 2.76  Impression: 62 year old male status post cardiac arrest.  His improvement is encouraging, and I suspect that he does have a chance of recovery at this point.  The asymmetry of his exam is concerning, especially with the possibility of an area where LV clot could form.  Recommendations: 1) without evidence of clear large area of infarct, and evidence on echo of potential for thrombus, agree with anticoagulation 2) I would favor continued aggressive care from a neurological standpoint. 3) neurology will continue to follow  Roland Rack, MD [facility] [phone]  If 7pm- 7am, please page neurology on call as listed in [place].

## 2017-09-07 NOTE — Progress Notes (Signed)
Nutrition Follow-up  DOCUMENTATION CODES:   Obesity unspecified  INTERVENTION:   Initiate TF via OGT with Vital High Protein at goal rate of 35 ml/h (840 ml per day) and Prostat 60 ml BID to provide 1240 kcals, 133 gm protein, 702 ml free water daily.  NUTRITION DIAGNOSIS:   Inadequate oral intake related to inability to eat as evidenced by NPO status.  Ongoing  GOAL:   Provide needs based on ASPEN/SCCM guidelines  Progressing  MONITOR:   I & O's, Vent status  REASON FOR ASSESSMENT:   Consult Enteral/tube feeding initiation and management  ASSESSMENT:   Pt with PMH of HTN, DM, ischemic cardiomyopathy, ejection fraction less than 20%, with an AICD in place admitted after PEA cardiac arrest.   Patient is remains intubated on ventilator support.  MV: 10.6 L/min Temp (24hrs), Avg:98.2 F (36.8 C), Min:96.3 F (35.7 C), Max:99.1 F (37.3 C)  3.5 L x 24 hours   Case discussed with RN, who reports pt has been rewarmed and confirms plan to start TF today.   Labs reviewed: (inpatient orders for glycemic control are 0-9 units insulin aspart every 4 hours).   NUTRITION - FOCUSED PHYSICAL EXAM:    Most Recent Value  Orbital Region  No depletion  Upper Arm Region  No depletion  Thoracic and Lumbar Region  No depletion  Buccal Region  No depletion  Temple Region  No depletion  Clavicle Bone Region  No depletion  Clavicle and Acromion Bone Region  No depletion  Scapular Bone Region  No depletion  Dorsal Hand  No depletion  Patellar Region  No depletion  Anterior Thigh Region  No depletion  Posterior Calf Region  No depletion  Edema (RD Assessment)  Moderate  Hair  Reviewed  Eyes  Reviewed  Mouth  Reviewed  Skin  Reviewed  Nails  Reviewed       Diet Order:  No diet orders on file  EDUCATION NEEDS:   No education needs have been identified at this time  Skin:  Skin Assessment: Reviewed RN Assessment  Last BM:  PTA  Height:   Ht Readings from Last 1  Encounters:  09/05/17 5\' 6"  (1.676 m)    Weight:   Wt Readings from Last 1 Encounters:  09/07/17 214 lb 15.2 oz (97.5 kg)    Ideal Body Weight:  64.5 kg  BMI:  Body mass index is 34.69 kg/m.  Estimated Nutritional Needs:   Kcal:  099-8338  Protein:  >129 grams  Fluid:  > 1.5 L/day    Daniele Yankowski A. Jimmye Norman, RD, LDN, CDE Pager: [phone] After hours Pager: [phone]

## 2017-09-07 NOTE — Progress Notes (Signed)
ANTICOAGULATION CONSULT NOTE - Follow Up Consult  Pharmacy Consult for Heparin Indication: chest pain/ACS  No Known Allergies  Patient Measurements: Height: 5\' 6"  (167.6 cm) Weight: 214 lb 15.2 oz (97.5 kg) IBW/kg (Calculated) : 63.8  Vital Signs: Temp: 98.4 F (36.9 C) (01/12 1400) Temp Source: Core (01/12 1200) BP: 96/76 (01/12 1500) Pulse Rate: 71 (01/12 1530)  Labs: Recent Labs    09/05/17 1018  09/05/17 1803 09/05/17 2322 09/06/17 0437 09/06/17 2143 09/07/17 0342 09/07/17 0650 09/07/17 1432  HGB 15.1  --   --   --  17.1*  --  17.6*  --   --   HCT 47.5  --   --   --  52.9*  --  51.2  --   --   PLT 221  --   --   --  248  --  220  --   --   APTT 37*  --  34  --   --   --   --   --   --   LABPROT 16.4*  --  16.8*  --   --   --   --   --   --   INR 1.34  --  1.38  --   --   --   --   --   --   HEPARINUNFRC  --   --   --   --   --  0.53  --  0.73* 0.71*  CREATININE 1.31*   < >  --  2.05* 2.14*  --  2.70*  --  2.76*  TROPONINI 2.65*   < > 22.82* 49.82* 70.34*  --   --   --   --    < > = values in this interval not displayed.    Estimated Creatinine Clearance: 30 mL/min (A) (by C-G formula based on SCr of 2.76 mg/dL (H)).   Medical History: Past Medical History:  Diagnosis Date  . AICD (automatic cardioverter/defibrillator) present   . Ankle injury    RIGHT  . Arthritis   . BPH (benign prostatic hypertrophy)   . Cancer ([place])   . Coronary artery disease   . Diabetes mellitus without complication ([place])    TYPE 2  . Dysrhythmia   . Glaucoma   . H/O: knee surgery    2 PINS IN RIGHT KNEE AND IN RIGHT ANKLE  . Hyperlipidemia   . Hypertension   . Mass of lung    RIGHT UPPER LOBE  . Myocardial infarction ([place])   . 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 Shortness of breath    WITH EXERTION     Assessment: 61yom Hx CAD s/p CABG 2002, ICM with ICD  admitted s/p VF arrest per pacemaker interrogation.  Amiodarone drip started in setting of VF, Mag 1.8 replaced > 2.1, Cr climbing and K 2.2 today start furosemide iv.  Troponin bump to 70  - started heparin for possible ACS.  CBC ok  Patient now TTM and more sensitive to heparin  Heparin drip 1000 uts/hr - running peripherally and drawn from CL per RN HL still > goal 0.71   Goal of Therapy:  Heparin level 0.3-0.7 units/ml Monitor platelets by anticoagulation protocol: Yes   Plan:  Decrease Heparin drip 700 uts./hr Daily HL, CBC  Bonnita Nasuti Pharm.D. CPP,  BCPS Clinical Pharmacist [phone] 09/07/2017 3:43 PM

## 2017-09-07 NOTE — Progress Notes (Addendum)
Patient ID: Mark Martin, male   DOB: March 20, 1956, 62 y.o.   MRN: [id number]     Advanced Heart Failure Rounding Note    Subjective:    Patient remains intubated/sedated.  CXR with mild vascular congestion and atelectasis. Minimal UOP (60 cc).  Creatinine to 2.7.  EEG with diffuse cerebral dysfunction.  SBP down to 70s this morning, on epinephrine 1 and norepinephrine 1.5.    He has femoral central and arterial lines.   He moves his left arm and right leg, has not awakened.   Objective:   Weight Range: 214 lb 15.2 oz (97.5 kg) Body mass index is 34.69 kg/m.   Vital Signs:   Temp:  [95.5 F (35.3 C)-99.1 F (37.3 C)] 98.1 F (36.7 C) (01/12 0400) Pulse Rate:  [38-95] 84 (01/12 0700) Resp:  [14-40] 30 (01/12 0700) BP: (79-130)/(52-93) 92/73 (01/12 0700) SpO2:  [86 %-100 %] 100 % (01/12 0731) Arterial Line BP: (80-115)/(52-73) 101/61 (01/12 0700) FiO2 (%):  [30 %-40 %] 30 % (01/12 0731) Weight:  [214 lb 15.2 oz (97.5 kg)] 214 lb 15.2 oz (97.5 kg) (01/12 0600) Last BM Date: (PTA)  Weight change: Filed Weights   09/05/17 1700 09/06/17 0500 09/07/17 0600  Weight: 197 lb 3.2 oz (89.4 kg) 203 lb 14.8 oz (92.5 kg) 214 lb 15.2 oz (97.5 kg)    Intake/Output:   Intake/Output Summary (Last 24 hours) at 09/07/2017 0742 Last data filed at 09/07/2017 0400 Gross per 24 hour  Intake 3563.56 ml  Output 160 ml  Net 3403.56 ml      Physical Exam    General:  Intubated, does not awaken HEENT: Normal Neck: Supple. JVP appears elevated. No lymphadenopathy or thyromegaly appreciated. Cor: PMI lateral. Regular rate & rhythm. No rubs, gallops or murmurs. Lungs: Decreased BS at bases.  Abdomen: Soft, nondistended. No hepatosplenomegaly. No bruits or masses. Good bowel sounds. Extremities: No cyanosis, clubbing, rash, edema.  Cool feet.  Neuro: Spontaneous movement right leg, left arm.  Does not awaken.    Telemetry   NSR with v-pacing (personally reviewed)   Labs     CBC Recent Labs    09/05/17 1018 09/06/17 0437 09/07/17 0342  WBC 11.2* 28.6* 27.0*  NEUTROABS 9.1*  --   --   HGB 15.1 17.1* 17.6*  HCT 47.5 52.9* 51.2  MCV 83.8 84.1 79.8  PLT 221 248 938   Basic Metabolic Panel Recent Labs    09/06/17 0437 09/07/17 0342  NA 136 127*  K 4.7 5.5*  CL 104 98*  CO2 12* 15*  GLUCOSE 188* 154*  BUN 33* 48*  CREATININE 2.14* 2.70*  CALCIUM 8.0* 7.6*  MG 1.8 2.1  PHOS 6.6* 5.7*   Liver Function Tests Recent Labs    09/05/17 1018  AST 76*  ALT 25  ALKPHOS 121  BILITOT 1.3*  PROT 5.5*  ALBUMIN 2.8*   No results for input(s): LIPASE, AMYLASE in the last 72 hours. Cardiac Enzymes Recent Labs    09/05/17 1803 09/05/17 2322 09/06/17 0437  TROPONINI 22.82* 49.82* 70.34*    BNP: BNP (last 3 results) Recent Labs    09/05/17 1018  BNP 921.9*    ProBNP (last 3 results) No results for input(s): PROBNP in the last 8760 hours.   D-Dimer No results for input(s): DDIMER in the last 72 hours. Hemoglobin A1C No results for input(s): HGBA1C in the last 72 hours. Fasting Lipid Panel No results for input(s): CHOL, HDL, LDLCALC, TRIG, CHOLHDL, LDLDIRECT in the last 72  hours. Thyroid Function Tests Recent Labs    09/07/17 0342  TSH 6.075*    Other results:   Imaging    Ct Head Wo Contrast  Result Date: 09/07/2017 CLINICAL DATA:  Hypoxic ischemic encephalopathy. EXAM: CT HEAD WITHOUT CONTRAST TECHNIQUE: Contiguous axial images were obtained from the base of the skull through the vertex without intravenous contrast. COMPARISON:  CT HEAD September 05, 2017 FINDINGS: Moderately motion degraded examination. BRAIN: No intraparenchymal hemorrhage, mass effect nor midline shift. The ventricles and sulci are normal for age. Patchy supratentorial white matter hypodensities within normal range for patient's age, though non-specific are most compatible with chronic small vessel ischemic disease. No acute large vascular territory infarcts.  No abnormal extra-axial fluid collections. Basal cisterns are patent. VASCULAR: Mild to moderate calcific atherosclerosis of the carotid siphons. SKULL: No skull fracture. No significant scalp soft tissue swelling. SINUSES/ORBITS: Mild LEFT sphenoid sinusitis without air-fluid levels. Mastoid air cells are well aerated. Included ocular globes and orbital contents are non-suspicious. Status post bilateral ocular lens implants. OTHER: Life-support lines in place. IMPRESSION: 1. No acute intracranial process on this moderately motion degraded examination. 2. Mild to moderate chronic small vessel ischemic disease. Electronically Signed   By: Elon Alas M.D.   On: 09/07/2017 06:23   Dg Chest Port 1 View  Result Date: 09/07/2017 CLINICAL DATA:  Respiratory failure. EXAM: PORTABLE CHEST 1 VIEW COMPARISON:  One-view chest x-ray 09/06/2017 FINDINGS: Endotracheal tube and NG tube are stable. Heart is enlarged. Pacing and defibrillator wires are stable. A left pleural effusion and lower lobe airspace disease remains. Mild pulmonary vascular congestion is stable. The right lung is clear. IMPRESSION: 1. Stable left pleural effusion and basilar airspace disease, likely atelectasis. 2. Mild vascular congestion is stable. 3. Support apparatus is stable. Electronically Signed   By: San Morelle M.D.   On: 09/07/2017 07:15      Medications:     Scheduled Medications: . aspirin  81 mg Per Tube Daily  . chlorhexidine gluconate (MEDLINE KIT)  15 mL Mouth Rinse BID  . Chlorhexidine Gluconate Cloth  6 each Topical Daily  . furosemide  80 mg Intravenous Once  . insulin aspart  0-9 Units Subcutaneous Q4H  . mouth rinse  15 mL Mouth Rinse 10 times per day  . pantoprazole (PROTONIX) IV  40 mg Intravenous QHS  . sodium chloride flush  10-40 mL Intracatheter Q12H  . sodium polystyrene  30 g Per Tube Once  . ticagrelor  90 mg Per Tube BID     Infusions: . sodium chloride    . sodium chloride 50 mL/hr at  09/07/17 0400  . amiodarone 30 mg/hr (09/07/17 0400)  . epinephrine 1 mcg/min (09/07/17 0400)  . heparin 1,200 Units/hr (09/07/17 0400)  . norepinephrine (LEVOPHED) Adult infusion 1.5 mcg/min (09/07/17 0400)  .  sodium bicarbonate  infusion 1000 mL 50 mL/hr at 09/07/17 0400     PRN Medications:  Place/Maintain arterial line **AND** sodium chloride, fentaNYL (SUBLIMAZE) injection, midazolam, midazolam, sodium chloride flush    Patient Profile   62 y.o. male with CABG x5( '02) and DES to mid Cx 2016, ICM, HTN, HL, DM, Lung mass s/p VATS who presented with out of hospital cardiac arrest.  Assessment/Plan   1. Cardiac arrest: VT => VF with ICD shock, also with PEA following.  Possible primary ischemic event with TnI to 70.  Rhythm stable overnight. He is s/p hypothermia protocol.  - Continue IV amiodarone.  2. Cardiogenic shock: He has known ischemic  cardiomyopathy with [employer] ICD. Echo this admission with EF 15%, swirling blood in apex concerning for LV thrombus risk.  Patient on norepinephrine 1.5 and epinephrine 1.  BP low this morning.  Minimal UOP, 60 cc yesterday.  - Will need to increase norepinephrine, keep MAP 65+.   - Will challenge with Lasix 80 mg IV x 1.  3. AKI: Creatinine 2.7 today with K 5.5 (rising), oliguric.   - As above, challenge with IV Lasix.  - Will give dose of Kayexalate 30 g x 1 with rising K.  4. Neuro: Concern for anoxic brain injury.  Nonspecific EEG with diffuse cerebral slowing.  CT nonacute.  Some movement but not purposeful.  5. Anticoagulation: Suspect coronary event with TnI to 70, also swirling blood in LV apex worrisome for LV thrombus risk.  Continue heparin gtt.  6. CAD: H/o CABG. Suspect ischemic event may have triggered VT.  TnI to 70.  He remains on ASA, Brilinta, heparin gtt.   Prognosis guarded, awaiting signs of neurologic recovery.   CRITICAL CARE Performed by: Loralie Champagne  Total critical care time: 35 minutes  Critical care time  was exclusive of separately billable procedures and treating other patients.  Critical care was necessary to treat or prevent imminent or life-threatening deterioration.  Critical care was time spent personally by me on the following activities: development of treatment plan with patient and/or surrogate as well as nursing, discussions with consultants, evaluation of patient's response to treatment, examination of patient, obtaining history from patient or surrogate, ordering and performing treatments and interventions, ordering and review of laboratory studies, ordering and review of radiographic studies, pulse oximetry and re-evaluation of patient's condition.  Length of Stay: 2  Loralie Champagne, MD  09/07/2017, 7:42 AM  Advanced Heart Failure Team Pager [phone] (M-F; 7a - 4p)  Please contact [facility] for night-coverage after hours (4p -7a ) and weekends on [email or web address]

## 2017-09-08 ENCOUNTER — Inpatient Hospital Stay (HOSPITAL_COMMUNITY): Payer: Medicare Other

## 2017-09-08 LAB — CBC WITH DIFFERENTIAL/PLATELET
BASOS ABS: 0 10*3/uL (ref 0.0–0.1)
BASOS PCT: 0 %
EOS ABS: 0 10*3/uL (ref 0.0–0.7)
Eosinophils Relative: 0 %
HCT: 49.8 % (ref 39.0–52.0)
HEMOGLOBIN: 16.4 g/dL (ref 13.0–17.0)
Lymphocytes Relative: 4 %
Lymphs Abs: 1.1 10*3/uL (ref 0.7–4.0)
MCH: 26.3 pg (ref 26.0–34.0)
MCHC: 32.9 g/dL (ref 30.0–36.0)
MCV: 79.8 fL (ref 78.0–100.0)
Monocytes Absolute: 1.7 10*3/uL — ABNORMAL HIGH (ref 0.1–1.0)
Monocytes Relative: 7 %
Neutro Abs: 21.3 10*3/uL — ABNORMAL HIGH (ref 1.7–7.7)
Neutrophils Relative %: 89 %
Platelets: 217 10*3/uL (ref 150–400)
RBC: 6.24 MIL/uL — AB (ref 4.22–5.81)
RDW: 17.5 % — ABNORMAL HIGH (ref 11.5–15.5)
WBC: 24.1 10*3/uL — AB (ref 4.0–10.5)

## 2017-09-08 LAB — BLOOD GAS, ARTERIAL
Acid-base deficit: 4.3 mmol/L — ABNORMAL HIGH (ref 0.0–2.0)
BICARBONATE: 19.2 mmol/L — AB (ref 20.0–28.0)
Drawn by: 36277
FIO2: 30
O2 Saturation: 97.8 %
PEEP/CPAP: 5 cmH2O
PH ART: 7.435 (ref 7.350–7.450)
Patient temperature: 98.6
RATE: 18 resp/min
VT: 510 mL
pCO2 arterial: 29.1 mmHg — ABNORMAL LOW (ref 32.0–48.0)
pO2, Arterial: 116 mmHg — ABNORMAL HIGH (ref 83.0–108.0)

## 2017-09-08 LAB — HEPARIN LEVEL (UNFRACTIONATED)
HEPARIN UNFRACTIONATED: 0.3 [IU]/mL (ref 0.30–0.70)
Heparin Unfractionated: 0.34 IU/mL (ref 0.30–0.70)

## 2017-09-08 LAB — POCT I-STAT 3, ART BLOOD GAS (G3+)
Acid-base deficit: 4 mmol/L — ABNORMAL HIGH (ref 0.0–2.0)
BICARBONATE: 18.8 mmol/L — AB (ref 20.0–28.0)
O2 Saturation: 98 %
PH ART: 7.412 (ref 7.350–7.450)
Patient temperature: 37.1
TCO2: 20 mmol/L — ABNORMAL LOW (ref 22–32)
pCO2 arterial: 29.5 mmHg — ABNORMAL LOW (ref 32.0–48.0)
pO2, Arterial: 99 mmHg (ref 83.0–108.0)

## 2017-09-08 LAB — GLUCOSE, CAPILLARY
GLUCOSE-CAPILLARY: 167 mg/dL — AB (ref 65–99)
GLUCOSE-CAPILLARY: 186 mg/dL — AB (ref 65–99)
Glucose-Capillary: 204 mg/dL — ABNORMAL HIGH (ref 65–99)
Glucose-Capillary: 152 mg/dL — ABNORMAL HIGH (ref 65–99)
Glucose-Capillary: 147 mg/dL — ABNORMAL HIGH (ref 65–99)

## 2017-09-08 LAB — BASIC METABOLIC PANEL
Anion gap: 17 — ABNORMAL HIGH (ref 5–15)
BUN: 78 mg/dL — ABNORMAL HIGH (ref 6–20)
CALCIUM: 7.8 mg/dL — AB (ref 8.9–10.3)
CHLORIDE: 99 mmol/L — AB (ref 101–111)
CO2: 18 mmol/L — ABNORMAL LOW (ref 22–32)
CREATININE: 2.89 mg/dL — AB (ref 0.61–1.24)
GFR, EST AFRICAN AMERICAN: 25 mL/min — AB (ref >60)
GFR, EST NON AFRICAN AMERICAN: 22 mL/min — AB (ref >60)
Glucose, Bld: 211 mg/dL — ABNORMAL HIGH (ref 65–99)
Potassium: 3.9 mmol/L (ref 3.5–5.1)
SODIUM: 134 mmol/L — AB (ref 135–145)

## 2017-09-08 LAB — PHOSPHORUS: PHOSPHORUS: 6.9 mg/dL — AB (ref 2.5–4.6)

## 2017-09-08 LAB — PROCALCITONIN: Procalcitonin: 6.27 ng/mL

## 2017-09-08 LAB — MAGNESIUM: MAGNESIUM: 2.2 mg/dL (ref 1.7–2.4)

## 2017-09-08 MED ORDER — ATORVASTATIN CALCIUM 40 MG PO TABS
40.0000 mg | ORAL_TABLET | Freq: Every day | ORAL | Status: DC
  Administered 2017-09-08 – 2017-09-23 (×14): 40 mg
  Filled 2017-09-08 (×12): qty 1

## 2017-09-08 MED ORDER — FUROSEMIDE 10 MG/ML IJ SOLN
80.0000 mg | Freq: Two times a day (BID) | INTRAMUSCULAR | Status: DC
Start: 2017-09-08 — End: 2017-09-09
  Administered 2017-09-08 – 2017-09-09 (×3): 80 mg via INTRAVENOUS
  Filled 2017-09-08 (×3): qty 8

## 2017-09-08 MED ORDER — FUROSEMIDE 10 MG/ML IJ SOLN: 80.0000 mg | Freq: Three times a day (TID) | INTRAMUSCULAR | Status: DC

## 2017-09-08 MED ORDER — POTASSIUM CHLORIDE 20 MEQ/15ML (10%) PO SOLN
40.0000 meq | Freq: Three times a day (TID) | ORAL | Status: AC
  Administered 2017-09-08 (×2): 40 meq
  Filled 2017-09-08 (×2): qty 30

## 2017-09-08 NOTE — Plan of Care (Signed)
  Progressing Cardiac: Ability to achieve and maintain adequate cardiopulmonary perfusion will improve 09/08/2017 1020 - Progressing by Vickey Sages, RN Neurologic: Promote progressive neurologic recovery 09/08/2017 1020 - Progressing by Vickey Sages, RN Skin Integrity: Risk for impaired skin integrity will be minimized. 09/08/2017 1020 - Progressing by Vickey Sages, RN Clinical Measurements: Ability to maintain clinical measurements within normal limits will improve 09/08/2017 1020 - Progressing by Vickey Sages, RN Will remain free from infection 09/08/2017 1020 - Progressing by Vickey Sages, RN Diagnostic test results will improve 09/08/2017 1020 - Progressing by Vickey Sages, RN Respiratory complications will improve 09/08/2017 1020 - Progressing by Vickey Sages, RN Cardiovascular complication will be avoided 09/08/2017 1020 - Progressing by Vickey Sages, RN Activity: Risk for activity intolerance will decrease 09/08/2017 1020 - Progressing by Vickey Sages, RN Nutrition: Adequate nutrition will be maintained 09/08/2017 1020 - Progressing by Vickey Sages, RN Coping: Level of anxiety will decrease 09/08/2017 1020 - Progressing by Vickey Sages, RN Elimination: Will not experience complications related to bowel motility 09/08/2017 1020 - Progressing by Vickey Sages, RN Will not experience complications related to urinary retention 09/08/2017 1020 - Progressing by Vickey Sages, RN Pain Managment: General experience of comfort will improve 09/08/2017 1020 - Progressing by Vickey Sages, RN Safety: Ability to remain free from injury will improve 09/08/2017 1020 - Progressing by Vickey Sages, RN Skin Integrity: Risk for impaired skin integrity will decrease 09/08/2017 1020 - Progressing by Vickey Sages, RN

## 2017-09-08 NOTE — Progress Notes (Addendum)
Subjective: Continues to improve  Exam: Vitals:   09/08/17 0725 09/08/17 0800  BP:  97/63  Pulse:  70  Resp:  (!) 24  Temp:  98.1 F (36.7 C)  SpO2: 100% 100%   Gen: In bed, NAD Resp: non-labored breathing, no acute distress Abd: soft, nt  Neuro: MS: Opens eyes to voice, fixates and tracks, does not follow commands CN: Blinks to threat bilaterally, pupils equal and reactive, crosses midline in both directions.  Motor: He withdraws briskly bilaterally Sensory: He does react to noxious stimulation in all 4 extremities  Pertinent Labs: Creatinine 2.76  Impression: 63 year old male status post cardiac arrest.  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   Recommendations: 1) If continued concern for focal deficits after extubation, could repeat CT scan at tha ttime, but my concern for ischemic stroke has decreased considerably today and 48 hour CT was negative.  2) No further recommendations at this time, further exams are not likely to change a recommendation of continued care from a neurological perspective. Please call with further questions or concerns.   Roland Rack, MD [facility] [phone]  If 7pm- 7am, please page neurology on call as listed in [place].

## 2017-09-08 NOTE — Progress Notes (Signed)
ANTICOAGULATION CONSULT NOTE - Follow Up Consult  Pharmacy Consult for Heparin Indication: chest pain/ACS  No Known Allergies  Patient Measurements: Height: 5\' 6"  (167.6 cm) Weight: 220 lb 10.2 oz (100.1 kg) IBW/kg (Calculated) : 63.8  Vital Signs: Temp: 99 F (37.2 C) (01/13 1400) Temp Source: Core (01/13 1400) BP: 84/64 (01/13 1400) Pulse Rate: 73 (01/13 1410)  Labs: Recent Labs    09/05/17 1803  09/05/17 2322  09/06/17 0437  09/07/17 0342  09/07/17 1432 09/08/17 0201 09/08/17 1347  HGB  --   --   --    < > 17.1*  --  17.6*  --   --  16.4  --   HCT  --   --   --   --  52.9*  --  51.2  --   --  49.8  --   PLT  --   --   --   --  248  --  220  --   --  217  --   APTT 34  --   --   --   --   --   --   --   --   --   --   LABPROT 16.8*  --   --   --   --   --   --   --   --   --   --   INR 1.38  --   --   --   --   --   --   --   --   --   --   HEPARINUNFRC  --   --   --   --   --    < >  --    < > 0.71* 0.34 0.30  CREATININE  --    < > 2.05*  --  2.14*  --  2.70*  --  2.76* 2.89*  --   TROPONINI 22.82*  --  49.82*  --  70.34*  --   --   --   --   --   --    < > = values in this interval not displayed.    Estimated Creatinine Clearance: 29 mL/min (A) (by C-G formula based on SCr of 2.89 mg/dL (H)).   Medical History: Past Medical History:  Diagnosis Date  . AICD (automatic cardioverter/defibrillator) present   . Ankle injury    RIGHT  . Arthritis   . BPH (benign prostatic hypertrophy)   . Cancer ([place])   . Coronary artery disease   . Diabetes mellitus without complication ([place])    TYPE 2  . Dysrhythmia   . Glaucoma   . H/O: knee surgery    2 PINS IN RIGHT KNEE AND IN RIGHT ANKLE  . Hyperlipidemia   . Hypertension   . Mass of lung    RIGHT UPPER LOBE  . Myocardial infarction ([place])   . 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 Shortness of breath    WITH  EXERTION     Assessment: 61yom Hx CAD s/p CABG 2002, ICM with ICD admitted s/p VF arrest per pacemaker interrogation.  Amiodarone drip started in setting of VF, Mag 1.8 replaced > 2.1, Cr climbing 3 and K 5.5>3.9 after starting  furosemide iv.  Troponin bump to 70  - started heparin  for possible ACS.  CBC ok  Patient  TTM was more sensitive to heparin while cool Now warm and starting to clear Heparin.  Heparin drip 700 uts/hr HL fell 0.7> 0.3 (though at goal )will increase today and recheck PM Heparin drip rate 900 uts/hr HL still 0.3 at goal now warm.     Goal of Therapy:  Heparin level 0.3-0.7 units/ml Monitor platelets by anticoagulation protocol: Yes   Plan:  Increase Heparin drip 1000 uts./hr Daily HL, CBC  Bonnita Nasuti Pharm.D. CPP, BCPS Clinical Pharmacist [phone] 09/08/2017 3:10 PM

## 2017-09-08 NOTE — Progress Notes (Signed)
PULMONARY / CRITICAL CARE MEDICINE   Name: Mark Martin MRN: [id number] DOB: 1956/03/25    ADMISSION DATE:  09/05/2017   REFERRING MD: Emergency department physician  CHIEF COMPLAINT: Cardiac arrest  HISTORY OF PRESENT ILLNESS:    Mark Martin is a 62 year old male with known ischemic cardiomyopathy, ejection fraction less than 20%, with an AICD in place.  Uses his usual state of health until 09/05/2017 at which time he was noted to go down this was witnessed.  Family started CPR EMS arrived and return of spontaneous circulation within 25 minutes with 1 dose of epinephrine.  His AICD was interrogated and showed 25 minutes of VT in PEA.  His AICD did shock him x1. He is transferred to [facility] emergency department he was intubated and started on epinephrine drip.  He was seen by cardiology.  He had multiple episodes of PEA requiring increasing doses of epinephrine. Pulmonary critical care at bedside arterial line and central line were placed into the right femoral artery and vein respectively.  Levophed was added to his pharmaceutical regimen along with amiodarone. He was stabilized in the emergency department he will be placed on hypothermia protocol transferred to the coronary intensive care unit.  SUBJECTIVE:  Pressor demand decreasing, no events overnight  VITAL SIGNS: BP 97/63 (BP Location: Right Arm)   Pulse 70   Temp 98.1 F (36.7 C) (Core)   Resp (!) 24   Ht 5\' 6"  (1.676 m)   Wt 100.1 kg (220 lb 10.2 oz)   SpO2 100%   BMI 35.61 kg/m   HEMODYNAMICS: CVP:  [21 mmHg-23 mmHg] 21 mmHg  VENTILATOR SETTINGS: Vent Mode: PSV;CPAP FiO2 (%):  [30 %] 30 % Set Rate:  [18 bmp] 18 bmp Vt Set:  [510 mL] 510 mL PEEP:  [5 cmH20] 5 cmH20 Pressure Support:  [5 cmH20] 5 cmH20 Plateau Pressure:  [20 cmH20] 20 cmH20  INTAKE / OUTPUT: I/O last 3 completed shifts: In: 6052.6 [I.V.:4365.6; Other:190; NG/GT:447; IV Piggyback:1050] Out: 2065 [Urine:1715; Emesis/NG  output:350]  PHYSICAL EXAMINATION: General: Opens eyes voice but not following commands HEENT: [place]/AT, PERRL, EOM-I and MMM PSY: Arousable Neuro: Arousable but not following commands, moving all ext spontaneously  CV: RRR, Nl S1/S2, and -M/R/G. PULM: Coarse BS diffusely GI: Soft, NT, ND and +BS Extremities: -edema and -tenderness Skin: Intact  LABS:  BMET Recent Labs  Lab 09/07/17 0342 09/07/17 1432 09/08/17 0201  NA 127* 133* 134*  K 5.5* 5.0 3.9  CL 98* 101 99*  CO2 15* 17* 18*  BUN 48* 56* 78*  CREATININE 2.70* 2.76* 2.89*  GLUCOSE 154* 90 211*   Electrolytes Recent Labs  Lab 09/06/17 0437 09/07/17 0342 09/07/17 1432 09/08/17 0201  CALCIUM 8.0* 7.6* 7.5* 7.8*  MG 1.8 2.1  --  2.2  PHOS 6.6* 5.7*  --  6.9*   CBC Recent Labs  Lab 09/06/17 0437 09/07/17 0342 09/08/17 0201  WBC 28.6* 27.0* 24.1*  HGB 17.1* 17.6* 16.4  HCT 52.9* 51.2 49.8  PLT 248 220 217   Coag's Recent Labs  Lab 09/05/17 1018 09/05/17 1803  APTT 37* 34  INR 1.34 1.38   Sepsis Markers Recent Labs  Lab 09/05/17 1027 09/07/17 0924 09/08/17 0201  LATICACIDVEN 5.78*  --   --   PROCALCITON  --  7.14 6.27   ABG Recent Labs  Lab 09/07/17 0357 09/08/17 0345 09/08/17 0512  PHART 7.447 7.435 7.412  PCO2ART 25.7* 29.1* 29.5*  PO2ART 134.0* 116* 99.0   Liver Enzymes Recent Labs  Lab 09/05/17 1018  AST 76*  ALT 25  ALKPHOS 121  BILITOT 1.3*  ALBUMIN 2.8*   Cardiac Enzymes Recent Labs  Lab 09/05/17 1803 09/05/17 2322 09/06/17 0437  TROPONINI 22.82* 49.82* 70.34*   Glucose Recent Labs  Lab 09/07/17 0859 09/07/17 1201 09/07/17 1537 09/07/17 2051 09/07/17 2339 09/08/17 0358  GLUCAP 134* 74 73 134* 186* 204*   Imaging Dg Chest Port 1 View  Result Date: 09/08/2017 CLINICAL DATA:  History of ET tube EXAM: PORTABLE CHEST 1 VIEW COMPARISON:  09/07/2017 FINDINGS: Endotracheal tube and NG tube as well as left AICD remain in place, unchanged. Cardiomegaly. Prior CABG.  Vascular congestion with interstitial prominence slightly increased since prior study, likely mild interstitial edema. Left base atelectasis or infiltrate again noted, unchanged. IMPRESSION: Slight increasing interstitial prominence, possibly interstitial edema. Continued left lower lobe atelectasis or infiltrate. Electronically Signed   By: Rolm Baptise M.D.   On: 09/08/2017 07:15   STUDIES:  09/05/2017 2D echo will be performed>> 09/05/2017 CT of the head without acute intracranial abnormality  CULTURES: Blood 1/12>>>NTD Procal 7.14  ANTIBIOTICS: Rocephin 1/12>>>  SIGNIFICANT EVENTS: 09/05/2017 VT PEA arrest  LINES/TUBES: 09/05/2017 endotracheal tube>> 09/05/2017 right femoral central line>> 09/05/2017 right femoral arterial line>>  DISCUSSION: 62 year old with extensive cardiac history presenting post cardiac arrest.  ASSESSMENT / PLAN:  PULMONARY A: Vent dependent respiratory failure secondary to PEA cardiac arrest History of left adenocarcinoma with resection in 2014 P:   Begin PS trials but no extubation given mental status D/C all sedation Adjust vent for ABG Titrate O2 for sat of 8-92%  CARDIOVASCULAR A:  Status post VT PEA arrest Cardiomyopathy with known ejection fraction less than 20% with AICD in place ST elevation MI Shock per AICD for VT PEA Cardiogenic shock P:  Cardiology consult, appreciate input Amiodarone drip reordered 09/06/2017 D/C epi Levo for MAP of 70 Hypo-thermia protocol  RENAL Lab Results  Component Value Date   CREATININE 2.89 (H) 09/08/2017   CREATININE 2.76 (H) 09/07/2017   CREATININE 2.70 (H) 09/07/2017   Recent Labs  Lab 09/07/17 0342 09/07/17 1432 09/08/17 0201  K 5.5* 5.0 3.9   A:   Renal insufficiency Magnesium hypo- Metabolic acidosis P:   Follow renal functions Replete electrolytes as needed D/C bicarb drip KVO IVF Lasix 80 mg IV BID per cards  GASTROINTESTINAL A:   GI protection P:   PPI TF per  nutrition  HEMATOLOGIC Recent Labs    09/07/17 0342 09/08/17 0201  HGB 17.6* 16.4   A:   Anticoagulation per cardiology P:  Per cardiology Heparin for DVT prophylaxis  INFECTIOUS A:   No overt infectious process P:   Monitor fever and white count curve PCT 7.14 Blood cultures NTD Rocephin 1/12>>>  ENDOCRINE CBG (last 3)  Recent Labs    09/07/17 2051 09/07/17 2339 09/08/17 0358  GLUCAP 134* 186* 204*   A:   Hyperglycemia P:   Sliding scale insulin  NEUROLOGIC A:   Post PEA arrest does not follow commands P:   RASS goal: 0 Sedation as needed Head CT negative Neuro following  FAMILY  - Updates: No family bedside to update 1/13  - Inter-disciplinary family meet or Palliative Care meeting due by:  day 7  The patient is critically ill with multiple organ systems failure and requires high complexity decision making for assessment and support, frequent evaluation and titration of therapies, application of advanced monitoring technologies and extensive interpretation of multiple databases.   Critical Care Time  devoted to patient care services described in this note is  35  Minutes. This time reflects time of care of this signee Dr [name]. This critical care time does not reflect procedure time, or teaching time or supervisory time of PA/NP/Med student/Med Resident etc but could involve care discussion time.  Rush Farmer, M.D. [facility] Pulmonary/Critical Care Medicine. Pager: [phone]. After hours pager: [phone].

## 2017-09-08 NOTE — Progress Notes (Signed)
Patient ID: Mark Martin, male   DOB: 1955-11-09, 62 y.o.   MRN: [id number]     Advanced Heart Failure Rounding Note    Subjective:    Patient remains intubated.  CXR with pulmonary edema.  Creatinine 2.7 => 2.89.  PCT 7.1 => 6.27, ceftriaxone started 1/12.  EEG with diffuse cerebral dysfunction, he now moves limbs and opens eyes/tracks (does not follow commands).  BP stable this morning, off norepinephrine and down to 0.5 on epinephrine.  He has femoral central and arterial lines.   Objective:   Weight Range: 220 lb 10.2 oz (100.1 kg) Body mass index is 35.61 kg/m.   Vital Signs:   Temp:  [98.4 F (36.9 C)-99 F (37.2 C)] 98.6 F (37 C) (01/13 0600) Pulse Rate:  [68-81] 70 (01/13 0700) Resp:  [0-30] 24 (01/13 0700) BP: (69-131)/(48-92) 105/73 (01/13 0700) SpO2:  [100 %] 100 % (01/13 0725) Arterial Line BP: (77-139)/(49-80) 111/65 (01/13 0700) FiO2 (%):  [30 %] 30 % (01/13 0725) Weight:  [220 lb 10.2 oz (100.1 kg)-225 lb 15.5 oz (102.5 kg)] 220 lb 10.2 oz (100.1 kg) (01/13 0600) Last BM Date: (PTA)  Weight change: Filed Weights   09/07/17 0600 09/08/17 0100 09/08/17 0600  Weight: 210 lb 1.6 oz (95.3 kg) 225 lb 15.5 oz (102.5 kg) 220 lb 10.2 oz (100.1 kg)    Intake/Output:   Intake/Output Summary (Last 24 hours) at 09/08/2017 0739 Last data filed at 09/08/2017 0700 Gross per 24 hour  Intake 4272.23 ml  Output 2055 ml  Net 2217.23 ml      Physical Exam    General: Intubated Neck: JVP appears elevated, no thyromegaly or thyroid nodule.  Lungs: Bilateral crackles.  CV: Lateral PMI.  Heart regular S1/S2, no S3/S4, no murmur.  Trace ankle edema.   Abdomen: Soft, no hepatosplenomegaly, no distention.  Skin: Intact without lesions or rashes.  Neurologic: Opens eyes/tracks, does not follow commands, moves limbs  Extremities: No clubbing or cyanosis.  HEENT: Normal.   Telemetry   NSR with v-pacing (personally reviewed)   Labs    CBC Recent Labs     09/05/17 1018  09/07/17 0342 09/08/17 0201  WBC 11.2*   < > 27.0* 24.1*  NEUTROABS 9.1*  --   --  21.3*  HGB 15.1   < > 17.6* 16.4  HCT 47.5   < > 51.2 49.8  MCV 83.8   < > 79.8 79.8  PLT 221   < > 220 217   < > = values in this interval not displayed.   Basic Metabolic Panel Recent Labs    09/07/17 0342 09/07/17 1432 09/08/17 0201  NA 127* 133* 134*  K 5.5* 5.0 3.9  CL 98* 101 99*  CO2 15* 17* 18*  GLUCOSE 154* 90 211*  BUN 48* 56* 78*  CREATININE 2.70* 2.76* 2.89*  CALCIUM 7.6* 7.5* 7.8*  MG 2.1  --  2.2  PHOS 5.7*  --  6.9*   Liver Function Tests Recent Labs    09/05/17 1018  AST 76*  ALT 25  ALKPHOS 121  BILITOT 1.3*  PROT 5.5*  ALBUMIN 2.8*   No results for input(s): LIPASE, AMYLASE in the last 72 hours. Cardiac Enzymes Recent Labs    09/05/17 1803 09/05/17 2322 09/06/17 0437  TROPONINI 22.82* 49.82* 70.34*    BNP: BNP (last 3 results) Recent Labs    09/05/17 1018  BNP 921.9*    ProBNP (last 3 results) No results for input(s): PROBNP in the  last 8760 hours.   D-Dimer No results for input(s): DDIMER in the last 72 hours. Hemoglobin A1C No results for input(s): HGBA1C in the last 72 hours. Fasting Lipid Panel No results for input(s): CHOL, HDL, LDLCALC, TRIG, CHOLHDL, LDLDIRECT in the last 72 hours. Thyroid Function Tests Recent Labs    09/07/17 0342  TSH 6.075*    Other results:   Imaging    Dg Chest Port 1 View  Result Date: 09/08/2017 CLINICAL DATA:  History of ET tube EXAM: PORTABLE CHEST 1 VIEW COMPARISON:  09/07/2017 FINDINGS: Endotracheal tube and NG tube as well as left AICD remain in place, unchanged. Cardiomegaly. Prior CABG. Vascular congestion with interstitial prominence slightly increased since prior study, likely mild interstitial edema. Left base atelectasis or infiltrate again noted, unchanged. IMPRESSION: Slight increasing interstitial prominence, possibly interstitial edema. Continued left lower lobe atelectasis  or infiltrate. Electronically Signed   By: Rolm Baptise M.D.   On: 09/08/2017 07:15     Medications:     Scheduled Medications: . aspirin  81 mg Per Tube Daily  . chlorhexidine gluconate (MEDLINE KIT)  15 mL Mouth Rinse BID  . Chlorhexidine Gluconate Cloth  6 each Topical Daily  . feeding supplement (PRO-STAT SUGAR FREE 64)  60 mL Per Tube BID  . feeding supplement (VITAL HIGH PROTEIN)  1,000 mL Per Tube Q24H  . furosemide  80 mg Intravenous Q8H  . insulin aspart  0-9 Units Subcutaneous Q4H  . mouth rinse  15 mL Mouth Rinse 10 times per day  . pantoprazole (PROTONIX) IV  40 mg Intravenous QHS  . sodium chloride flush  10-40 mL Intracatheter Q12H  . ticagrelor  90 mg Per Tube BID    Infusions: . sodium chloride    . sodium chloride 75 mL/hr at 09/08/17 0700  . amiodarone 30 mg/hr (09/08/17 0700)  . cefTRIAXone (ROCEPHIN)  IV Stopped (09/07/17 1752)  . epinephrine 0.507 mcg/min (09/08/17 0700)  . heparin 700 Units/hr (09/08/17 0700)  . norepinephrine (LEVOPHED) Adult infusion 2.027 mcg/min (09/08/17 0700)    PRN Medications: Place/Maintain arterial line **AND** sodium chloride, fentaNYL (SUBLIMAZE) injection, Influenza vac split quadrivalent PF, midazolam, midazolam, sodium chloride flush    Patient Profile   62 y.o. male with CABG x5( '02) and DES to mid Cx 2016, ICM, HTN, HL, DM, Lung mass s/p VATS who presented with out of hospital cardiac arrest.  Assessment/Plan   1. Cardiac arrest: VT => VF with ICD shock, also with PEA following.  Possible primary ischemic event with TnI to 70.  Rhythm stable overnight. He is s/p hypothermia protocol.  - Continue IV amiodarone.  2. Cardiogenic shock: He has known ischemic cardiomyopathy with [employer] ICD. Echo this admission with EF 15%, swirling blood in apex concerning for LV thrombus risk.  Patient now on only epinephrine 0.5 and with stable BP can likely stop.  Creatinine higher but UOP also better.  CXR with pulmonary edema,  weight up.   - Should be able to stop epinephrine this am.  - Lasix 80 mg IV bid today.  3. AKI: Creatinine 2.7 => 2.89.     - As above, challenge with IV Lasix again.   4. Neuro: Concern for anoxic brain injury.  Nonspecific EEG with diffuse cerebral slowing.  CT nonacute.  He is now opening eyes/tracking and moving limbs.  5. Anticoagulation: Suspect coronary event with TnI to 70, also swirling blood in LV apex worrisome for LV thrombus risk.  Continue heparin gtt.  6. CAD:  H/o CABG. Suspect ischemic event may have triggered VT.  TnI to 70.  He remains on ASA, Brilinta, heparin gtt. Add statin.  7. ID: Concern for aspiration PNA, now on ceftriaxone.  PCT 7.1 => 6.27.  WBCs 24.  Afebrile.   CRITICAL CARE Performed by: Loralie Champagne  Total critical care time: 35 minutes  Critical care time was exclusive of separately billable procedures and treating other patients.  Critical care was necessary to treat or prevent imminent or life-threatening deterioration.  Critical care was time spent personally by me on the following activities: development of treatment plan with patient and/or surrogate as well as nursing, discussions with consultants, evaluation of patient's response to treatment, examination of patient, obtaining history from patient or surrogate, ordering and performing treatments and interventions, ordering and review of laboratory studies, ordering and review of radiographic studies, pulse oximetry and re-evaluation of patient's condition.  Length of Stay: 3  Loralie Champagne, MD  09/08/2017, 7:39 AM  Advanced Heart Failure Team Pager [phone] (M-F; 7a - 4p)  Please contact [facility] for night-coverage after hours (4p -7a ) and weekends on [email or web address]

## 2017-09-08 NOTE — Progress Notes (Signed)
ANTICOAGULATION CONSULT NOTE - Follow Up Consult  Pharmacy Consult for Heparin Indication: chest pain/ACS  No Known Allergies  Patient Measurements: Height: 5\' 6"  (167.6 cm) Weight: 220 lb 10.2 oz (100.1 kg) IBW/kg (Calculated) : 63.8  Vital Signs: Temp: 98.6 F (37 C) (01/13 0600) Temp Source: Core (01/13 0600) BP: 105/73 (01/13 0700) Pulse Rate: 70 (01/13 0700)  Labs: Recent Labs    09/05/17 1018  09/05/17 1803 09/05/17 2322 09/06/17 0437  09/07/17 0342 09/07/17 0650 09/07/17 1432 09/08/17 0201  HGB 15.1  --   --   --  17.1*  --  17.6*  --   --  16.4  HCT 47.5  --   --   --  52.9*  --  51.2  --   --  49.8  PLT 221  --   --   --  248  --  220  --   --  217  APTT 37*  --  34  --   --   --   --   --   --   --   LABPROT 16.4*  --  16.8*  --   --   --   --   --   --   --   INR 1.34  --  1.38  --   --   --   --   --   --   --   HEPARINUNFRC  --   --   --   --   --    < >  --  0.73* 0.71* 0.34  CREATININE 1.31*   < >  --  2.05* 2.14*  --  2.70*  --  2.76* 2.89*  TROPONINI 2.65*   < > 22.82* 49.82* 70.34*  --   --   --   --   --    < > = values in this interval not displayed.    Estimated Creatinine Clearance: 29 mL/min (A) (by C-G formula based on SCr of 2.89 mg/dL (H)).   Medical History: Past Medical History:  Diagnosis Date  . AICD (automatic cardioverter/defibrillator) present   . Ankle injury    RIGHT  . Arthritis   . BPH (benign prostatic hypertrophy)   . Cancer ([place])   . Coronary artery disease   . Diabetes mellitus without complication ([place])    TYPE 2  . Dysrhythmia   . Glaucoma   . H/O: knee surgery    2 PINS IN RIGHT KNEE AND IN RIGHT ANKLE  . Hyperlipidemia   . Hypertension   . Mass of lung    RIGHT UPPER LOBE  . Myocardial infarction ([place])   . 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 Shortness of breath    WITH EXERTION      Assessment: 61yom Hx CAD s/p CABG 2002, ICM with ICD admitted s/p VF arrest per pacemaker interrogation.  Amiodarone drip started in setting of VF, Mag 1.8 replaced > 2.1, Cr climbing 3 and K 5.5>3.9 after starting  furosemide iv.  Troponin bump to 70  - started heparin for possible ACS.  CBC ok  Patient  TTM was more sensitive to heparin while cool Now warm and starting to clear Heparin.  Heparin drip 700 uts/hr HL fell 0.7> 0.3 (though at goal )will increase today and recheck   Goal  of Therapy:  Heparin level 0.3-0.7 units/ml Monitor platelets by anticoagulation protocol: Yes   Plan:  Increase Heparin drip 900 uts./hr HL in 6hr  Daily HL, CBC  Bonnita Nasuti Pharm.D. CPP, BCPS Clinical Pharmacist [phone] 09/08/2017 7:49 AM

## 2017-09-09 ENCOUNTER — Inpatient Hospital Stay (HOSPITAL_COMMUNITY): Payer: Medicare Other

## 2017-09-09 DIAGNOSIS — N179 Acute kidney failure, unspecified: Secondary | ICD-10-CM

## 2017-09-09 DIAGNOSIS — G9340 Encephalopathy, unspecified: Secondary | ICD-10-CM

## 2017-09-09 LAB — BASIC METABOLIC PANEL
Anion gap: 14 (ref 5–15)
Anion gap: 15 (ref 5–15)
BUN: 104 mg/dL — AB (ref 6–20)
BUN: 117 mg/dL — ABNORMAL HIGH (ref 6–20)
CO2: 22 mmol/L (ref 22–32)
CO2: 23 mmol/L (ref 22–32)
Calcium: 7.8 mg/dL — ABNORMAL LOW (ref 8.9–10.3)
Calcium: 7.9 mg/dL — ABNORMAL LOW (ref 8.9–10.3)
Chloride: 101 mmol/L (ref 101–111)
Chloride: 99 mmol/L — ABNORMAL LOW (ref 101–111)
Creatinine, Ser: 3.14 mg/dL — ABNORMAL HIGH (ref 0.61–1.24)
Creatinine, Ser: 3.39 mg/dL — ABNORMAL HIGH (ref 0.61–1.24)
GFR calc Af Amer: 21 mL/min — ABNORMAL LOW (ref >60)
GFR, EST AFRICAN AMERICAN: 23 mL/min — AB (ref >60)
GFR, EST NON AFRICAN AMERICAN: 20 mL/min — AB (ref >60)
GFR, EST NON AFRICAN AMERICAN: 18 mL/min — AB (ref >60)
GLUCOSE: 135 mg/dL — AB (ref 65–99)
Glucose, Bld: 186 mg/dL — ABNORMAL HIGH (ref 65–99)
POTASSIUM: 3.8 mmol/L (ref 3.5–5.1)
POTASSIUM: 3.3 mmol/L — AB (ref 3.5–5.1)
SODIUM: 137 mmol/L (ref 135–145)
Sodium: 137 mmol/L (ref 135–145)

## 2017-09-09 LAB — GLUCOSE, CAPILLARY
GLUCOSE-CAPILLARY: 159 mg/dL — AB (ref 65–99)
GLUCOSE-CAPILLARY: 182 mg/dL — AB (ref 65–99)
GLUCOSE-CAPILLARY: 188 mg/dL — AB (ref 65–99)
Glucose-Capillary: 191 mg/dL — ABNORMAL HIGH (ref 65–99)
Glucose-Capillary: 160 mg/dL — ABNORMAL HIGH (ref 65–99)
Glucose-Capillary: 93 mg/dL (ref 65–99)
Glucose-Capillary: 138 mg/dL — ABNORMAL HIGH (ref 65–99)
Glucose-Capillary: 138 mg/dL — ABNORMAL HIGH (ref 65–99)

## 2017-09-09 LAB — POCT I-STAT 3, ART BLOOD GAS (G3+)
ACID-BASE DEFICIT: 1 mmol/L (ref 0.0–2.0)
BICARBONATE: 22.9 mmol/L (ref 20.0–28.0)
O2 SAT: 99 %
TCO2: 24 mmol/L (ref 22–32)
pCO2 arterial: 34.1 mmHg (ref 32.0–48.0)
pH, Arterial: 7.436 (ref 7.350–7.450)
pO2, Arterial: 125 mmHg — ABNORMAL HIGH (ref 83.0–108.0)

## 2017-09-09 LAB — CULTURE, RESPIRATORY W GRAM STAIN

## 2017-09-09 LAB — HEPARIN LEVEL (UNFRACTIONATED)
Heparin Unfractionated: 0.23 IU/mL — ABNORMAL LOW (ref 0.30–0.70)
Heparin Unfractionated: 0.5 IU/mL (ref 0.30–0.70)

## 2017-09-09 LAB — CBC WITH DIFFERENTIAL/PLATELET
BASOS ABS: 0 10*3/uL (ref 0.0–0.1)
Basophils Relative: 0 %
EOS ABS: 0 10*3/uL (ref 0.0–0.7)
EOS PCT: 0 %
HCT: 43.8 % (ref 39.0–52.0)
Hemoglobin: 14.9 g/dL (ref 13.0–17.0)
LYMPHS PCT: 5 %
Lymphs Abs: 1.1 10*3/uL (ref 0.7–4.0)
MCH: 26.9 pg (ref 26.0–34.0)
MCHC: 34 g/dL (ref 30.0–36.0)
MCV: 79.2 fL (ref 78.0–100.0)
Monocytes Absolute: 1.4 10*3/uL — ABNORMAL HIGH (ref 0.1–1.0)
Monocytes Relative: 6 %
Neutro Abs: 20.9 10*3/uL — ABNORMAL HIGH (ref 1.7–7.7)
Neutrophils Relative %: 89 %
PLATELETS: 168 10*3/uL (ref 150–400)
RBC: 5.53 MIL/uL (ref 4.22–5.81)
RDW: 17.3 % — ABNORMAL HIGH (ref 11.5–15.5)
WBC: 23.4 10*3/uL — AB (ref 4.0–10.5)

## 2017-09-09 LAB — CULTURE, RESPIRATORY: CULTURE: NORMAL

## 2017-09-09 LAB — PHOSPHORUS: PHOSPHORUS: 5.1 mg/dL — AB (ref 2.5–4.6)

## 2017-09-09 LAB — TRIGLYCERIDES: Triglycerides: 65 mg/dL (ref <150)

## 2017-09-09 LAB — PROCALCITONIN: PROCALCITONIN: 5.66 ng/mL

## 2017-09-09 LAB — MAGNESIUM: Magnesium: 2.2 mg/dL (ref 1.7–2.4)

## 2017-09-09 MED ORDER — PROPOFOL 1000 MG/100ML IV EMUL: 5.0000 ug/kg/min | INTRAVENOUS | Status: DC

## 2017-09-09 MED ORDER — FUROSEMIDE 10 MG/ML IJ SOLN: 40.0000 mg | Freq: Two times a day (BID) | INTRAMUSCULAR | Status: DC

## 2017-09-09 MED ORDER — FUROSEMIDE 10 MG/ML IJ SOLN
80.0000 mg | Freq: Four times a day (QID) | INTRAMUSCULAR | Status: DC
  Administered 2017-09-09 – 2017-09-11 (×8): 80 mg via INTRAVENOUS
  Filled 2017-09-09 (×8): qty 8

## 2017-09-09 NOTE — Care Management Note (Addendum)
Case Management Note  Patient Details  Name: Gilliam Hawkes MRN: [id number] Date of Birth: Jan 18, 1956  Subjective/Objective:  From home, VDRF secondary to cardiac arrest, worsening aki, on pressure support, has AMS, has EF less than 20% with AICD.  Conts on amio, lasix iv, rocephin, and heparin drip.   1/16 [place], BSN- resp status improving with ongoing diuresis, plan to extubate patient this afternoon when family gets here.     1/17 [place], BSN - encephalapathy is barrier to extubation,will start precedex and extubate if he does well per MD note, conts on high dose lasix, heparin drip, and rocephin.     1/21 [place] RN,BSN- patient was extubated on 1/20, he is not able to clear his airway at all, per MD he will need to have meeting with family in am to discuss re tube and trach, on cxr showed infiltrate.    1/22 [place], BSN-patient will be a LTACH candidate after trach placed this week.  [facility], educational is aware and [facility] liason is aware.               Action/Plan: NCM will follow for dc needs.   Expected Discharge Date:                  Expected Discharge Plan:     In-House Referral:     Discharge planning Services  CM Consult  Post Acute Care Choice:    Choice offered to:     DME Arranged:    DME Agency:     HH Arranged:    HH Agency:     Status of Service:  In process, will continue to follow  If discussed at Long Length of Stay Meetings, dates discussed:    Additional Comments:  Zenon Mayo, RN 09/09/2017, 5:18 PM

## 2017-09-09 NOTE — Progress Notes (Signed)
Pt transported to CT2 and back to room 2H01 on vent with RT and RN, no complications

## 2017-09-09 NOTE — Progress Notes (Signed)
    Patient seen and examined.  Chart reviewed.  Plan discussed with critical care medicine.  Remains relatively stable hemodynamically with no pressors now.  Appears to be in a more sinus rhythm with paced beats (a sensing V pacing) on amiodarone.  Has been on IV amiodarone since admission.  Consider possibly converting to oral tomorrow.  Neurologically he seems to be slow to recover.  Per the nurse caring for him, he did move his feet for her, but has not open his eyes yet today.  Not fully following commands.  Per neurology, he does respond to noxious stimuli in all 4 extremities and blanks.  Still does not follow commands but opens eyes to voice.   At this point, he remains relatively stable cardiac standpoint.  No significant changes recommended.  We will follow along with full note to follow tomorrow.   Glenetta Hew, MD

## 2017-09-09 NOTE — Progress Notes (Signed)
PULMONARY / CRITICAL CARE MEDICINE   Name: Mark Martin MRN: [id number] DOB: 23-May-1956    ADMISSION DATE:  09/05/2017   REFERRING MD: Emergency department physician  CHIEF COMPLAINT: Cardiac arrest  BRIEF  Mark Martin is a 62 year old male with known ischemic cardiomyopathy, ejection fraction less than 20%, with an AICD in place.  Uses his usual state of health until 09/05/2017 at which time he was noted to go down this was witnessed.  Family started CPR EMS arrived and return of spontaneous circulation within 25 minutes with 1 dose of epinephrine.  His AICD was interrogated and showed 25 minutes of VT in PEA.  His AICD did shock him x1. He is transferred to [facility] emergency department he was intubated and started on epinephrine drip.  He was seen by cardiology.  He had multiple episodes of PEA requiring increasing doses of epinephrine. Pulmonary critical care at bedside arterial line and central line were placed into the right femoral artery and vein respectively.  Levophed was added to his pharmaceutical regimen along with amiodarone. He was stabilized in the emergency department he will be placed on hypothermia protocol transferred to the coronary intensive care unit.   STUDIES:  09/05/2017 2D echo will be performed>> 09/05/2017 CT of the head without acute intracranial abnormality  CULTURES: Blood 1/12>>>NTD Procal 7.14  ANTIBIOTICS: Rocephin 1/12>>> LINES/TUBES: 09/05/2017 endotracheal tube>> 09/05/2017 right femoral central line>> 09/05/2017 right femoral arterial line>>  DISCUSSION: 62 year old with extensive cardiac history presenting post cardiac arrest.    SIGNIFICANT EVENTS: 09/05/2017 VT PEA arrest 09/08/17 - Pressor demand decreasing, no events overnight    SUBJECTIVE/OVERNIGHT/INTERVAL HX 09/09/17 - worsening AKI. Family at beside. On vent. Daughter at bedside and wife report less responsiveness x 48h/ Last prn sedation of fent/versed on 09/06/17 - per RN  moves all 4s, non purposeful and weaning on SBT. Off pressors. Has femoral lines  VITAL SIGNS: BP (!) 87/60   Pulse 66   Temp 98.8 F (37.1 C)   Resp (!) 22   Ht 5\' 6"  (1.676 m)   Wt 211 lb 13.8 oz (96.1 kg)   SpO2 99%   BMI 34.20 kg/m   HEMODYNAMICS:    VENTILATOR SETTINGS: Vent Mode: PSV;CPAP FiO2 (%):  [30 %] 30 % Set Rate:  [18 bmp] 18 bmp Vt Set:  [510 mL] 510 mL PEEP:  [5 cmH20] 5 cmH20 Pressure Support:  [10 cmH20] 10 cmH20 Plateau Pressure:  [21 cmH20] 21 cmH20  INTAKE / OUTPUT: I/O last 3 completed shifts: In: 3749.3 [I.V.:1922.3; Other:75; NG/GT:1702; IV Piggyback:50] Out: 6980 [Urine:6980]  PHYSICAL EXAMINATION:  General Appearance:    Looks criticall ill  Head:    Normocephalic, without obvious abnormality, atraumatic  Eyes:    PERRL - yes, conjunctiva/corneas - clear      Ears:    Normal external ear canals, both ears  Nose:   NG tube - no  Throat:  ETT TUBE - yes , OG tube - yes  Neck:   Supple,  No enlargement/tenderness/nodules     Lungs:     Clear to auscultation bilaterally, Ventilator   Synchrony - yes on SBT  Chest wall:    No deformity  Heart:    S1 and S2 normal, no murmur, CVP - no.  Pressors - no  Abdomen:     Soft, no masses, no organomegaly  Genitalia:    Not done - has femoral CVL and aline  Rectal:   not done  Extremities:   Extremities- intact  but has ++ edema     Skin:   Intact in exposed areas .     Neurologic:   Sedation - none -> RASS - -3 . Moves all 4s - yes; restless. CAM-ICU - na . Orientation - na    ` LABS: PULMONARY Recent Labs  Lab 09/05/17 1656 09/05/17 2337 09/06/17 0508 09/07/17 0357 09/08/17 0345 09/08/17 0512 09/09/17 0506  PHART 7.212* 7.234* 7.301* 7.447 7.435 7.412 7.436  PCO2ART 30.8* 31.2* 28.5* 25.7* 29.1* 29.5* 34.1  PO2ART 90.0 144.0* 179* 134.0* 116* 99.0 125.0*  HCO3 12.8* 13.4* 13.6* 17.8* 19.2* 18.8* 22.9  TCO2 14* 14*  --  19*  --  20* 24  O2SAT 96.0 99.0 98.9 99.0 97.8 98.0 99.0     CBC Recent Labs  Lab 09/07/17 0342 09/08/17 0201 09/09/17 0416  HGB 17.6* 16.4 14.9  HCT 51.2 49.8 43.8  WBC 27.0* 24.1* 23.4*  PLT 220 217 168    COAGULATION Recent Labs  Lab 09/05/17 1018 09/05/17 1803  INR 1.34 1.38    CARDIAC   Recent Labs  Lab 09/05/17 1018 09/05/17 1244 09/05/17 1803 09/05/17 2322 09/06/17 0437  TROPONINI 2.65* 9.73* 22.82* 49.82* 70.34*   No results for input(s): PROBNP in the last 168 hours.   CHEMISTRY Recent Labs  Lab 09/06/17 0437 09/07/17 0342 09/07/17 1432 09/08/17 0201 09/09/17 0416  NA 136 127* 133* 134* 137  K 4.7 5.5* 5.0 3.9 3.8  CL 104 98* 101 99* 101  CO2 12* 15* 17* 18* 22  GLUCOSE 188* 154* 90 211* 186*  BUN 33* 48* 56* 78* 104*  CREATININE 2.14* 2.70* 2.76* 2.89* 3.14*  CALCIUM 8.0* 7.6* 7.5* 7.8* 7.8*  MG 1.8 2.1  --  2.2 2.2  PHOS 6.6* 5.7*  --  6.9* 5.1*   Estimated Creatinine Clearance: 26.1 mL/min (A) (by C-G formula based on SCr of 3.14 mg/dL (H)).   LIVER Recent Labs  Lab 09/05/17 1018 09/05/17 1803  AST 76*  --   ALT 25  --   ALKPHOS 121  --   BILITOT 1.3*  --   PROT 5.5*  --   ALBUMIN 2.8*  --   INR 1.34 1.38     INFECTIOUS Recent Labs  Lab 09/05/17 1027 09/07/17 0924 09/08/17 0201 09/09/17 0416  LATICACIDVEN 5.78*  --   --   --   PROCALCITON  --  7.14 6.27 5.66     ENDOCRINE CBG (last 3)  Recent Labs    09/09/17 0031 09/09/17 0433 09/09/17 0734  GLUCAP 188* 191* 160*         IMAGING x48h  - image(s) personally visualized  -   highlighted in bold Dg Chest Port 1 View  Result Date: 09/09/2017 CLINICAL DATA:  Hypoxia EXAM: PORTABLE CHEST 1 VIEW COMPARISON:  September 08, 2017 FINDINGS: Endotracheal tube tip is 5.7 cm above the carina. Nasogastric tube tip and side port are below the diaphragm. Pacemaker lead tips are attached the right atrium, right ventricle, and coronary sinus. No pneumothorax. There is atelectatic change in the left base with small left pleural  effusion. Right lung is clear. Heart is mildly enlarged with pulmonary vascularity within normal limits. No adenopathy. No evident bone lesions. IMPRESSION: Tube positions as described without pneumothorax. Stable cardiac prominence. Left base atelectasis with small left pleural effusion. Right lung clear. Electronically Signed   By: Lowella Grip III M.D.   On: 09/09/2017 07:37   Dg Chest Port 1 View  Result Date: 09/08/2017 CLINICAL  DATA:  History of ET tube EXAM: PORTABLE CHEST 1 VIEW COMPARISON:  09/07/2017 FINDINGS: Endotracheal tube and NG tube as well as left AICD remain in place, unchanged. Cardiomegaly. Prior CABG. Vascular congestion with interstitial prominence slightly increased since prior study, likely mild interstitial edema. Left base atelectasis or infiltrate again noted, unchanged. IMPRESSION: Slight increasing interstitial prominence, possibly interstitial edema. Continued left lower lobe atelectasis or infiltrate. Electronically Signed   By: Rolm Baptise M.D.   On: 09/08/2017 07:15       ASSESSMENT / PLAN:  PULMONARY A: Vent dependent respiratory failure secondary to PEA cardiac arrest History of left adenocarcinoma with resection in 2014  09/09/2017 - > doing  SBT but does not meet Extubation ctieria in setting of Acute Respiratory Failure due to acute encephalopathy   P:   Begin PS trials but no extubation given mental status D/C all sedation; can do dipivan Adjust vent for ABG Titrate O2 for sat of 8-92%  CARDIOVASCULAR A:  Status post VT PEA arrest Cardiomyopathy with known ejection fraction less than 20% with AICD in place ST elevation MI Shock per AICD for VT PEA Cardiogenic shock   09/09/2017 ->  not on Pressor . Cards following. On IV heparin gtt and amio gtt  P:  Cardiology consult, appreciate input Continue Amiodarone drip reordered 09/06/2017 MAP goal > 65  RENAL Lab Results  Component Value Date   CREATININE 3.14 (H) 09/09/2017    CREATININE 2.89 (H) 09/08/2017   CREATININE 2.76 (H) 09/07/2017   Recent Labs  Lab 09/07/17 1432 09/08/17 0201 09/09/17 0416  K 5.0 3.9 3.8   A:   Renal insufficiency   Worsening renal function on 09/09/2017   P:   INcrease lasix to 80mg  Q6H from BID Follow renal functions Replete electrolytes as needed KVO IVF   GASTROINTESTINAL A:   GI protection P:   PPI TF per nutrition  HEMATOLOGIC Recent Labs    09/08/17 0201 09/09/17 0416  HGB 16.4 14.9   A:   Anticoagulation per cardiology P:  Per cardiology Heparin for DVT prophylaxis  INFECTIOUS A:   No overt infectious process P:   Monitor fever and white count curve PCT 7.14 Blood cultures NTD Rocephin 1/12>>>  ENDOCRINE CBG (last 3)  Recent Labs    09/09/17 0031 09/09/17 0433 09/09/17 0734  GLUCAP 188* 191* 160*   A:   Hyperglycemia P:   Sliding scale insulin  NEUROLOGIC A:   Post PEA arrest does not follow commands   09/09/2017 - family reporting worsening mental status since last Head CT 09/07/17 P:   reopeat HEad CT 09/09/2017 Get EEG RASS goal: 0 Sedation as needed Neuro following  FAMILY  - Updates: family daughter and wife updated 09/09/2017  - Inter-disciplinary family meet or Palliative Care meeting due by:  day 7  The patient is critically ill with multiple organ systems failure and requires high complexity decision making for assessment and support, frequent evaluation and titration of therapies, application of advanced monitoring technologies and extensive interpretation of multiple databases.   Critical Care Time devoted to patient care services described in this note is  35  Minutes. This time reflects time of care of this signee Dr [name]. This critical care time does not reflect procedure time, or teaching time or supervisory time of PA/NP/Med student/Med Resident etc but could involve care discussion time.     Dr. Brand Males, M.D., [facility].C.P Pulmonary and  Critical Care Medicine Staff Physician, [facility] Director -  Interstitial Lung Disease  Program  Pulmonary [place] at [place], [place], [address]  Pager: [phone], If no answer or between  15:00h - 7:00h: call [phone] Telephone: [phone]

## 2017-09-09 NOTE — Procedures (Signed)
ELECTROENCEPHALOGRAM REPORT  Date of Study: 09/09/2017  Patient's Name: Mark Martin MRN: [id number] Date of Birth: June 20, 1956  Referring Provider: Dr. Brand Males  Clinical History: This is a 62 year old man s/p cardiac arrest  Medications: amiodarone (NEXTERONE PREMIX) 360-4.14 MG/200ML-% (1.8 mg/mL) IV infusion  aspirin chewable tablet 81 mg  atorvastatin (LIPITOR) tablet 40 mg  cefTRIAXone (ROCEPHIN) 1 g in dextrose 5 % 50 mL IVPB  furosemide (LASIX) injection 80 mg  heparin ADULT infusion 100 units/mL (25000 units/256mL sodium chloride 0.45%)  Influenza vac split quadrivalent PF (FLUARIX) injection 0.5 mL  insulin aspart (novoLOG) injection 0-9 Units  norepinephrine (LEVOPHED) 16 mg in dextrose 5 % 250 mL (0.064 mg/mL) infusion  pantoprazole (PROTONIX) injection 40 mg  ticagrelor (BRILINTA) tablet 90 mg   Technical Summary: A multichannel digital EEG recording measured by the international 10-20 system with electrodes applied with paste and impedances below 5000 ohms performed as portable with EKG monitoring in an intubated and unresponsive patient.  Hyperventilation and photic stimulation were not performed.  The digital EEG was referentially recorded, reformatted, and digitally filtered in a variety of bipolar and referential montages for optimal display.   Description: The patient is intubated and unresponsive during the recording. No sedating medications listed. There is no clear posterior dominant rhythm seen. The background consists of a large amount of diffuse 4-7 Hz theta and 2-3 Hz delta slowing. Normal sleep architecture is not seen. No reactivity to stimulation. Hyperventilation and photic stimulation were not performed. There were no epileptiform discharges or electrographic seizures seen.    EKG lead was unremarkable.  Impression: This EEG is abnormal due to moderate diffuse background slowing.  Clinical Correlation of the above findings indicates diffuse  cerebral dysfunction that is non-specific in etiology and can be seen with hypoxic/ischemic injury, toxic/metabolic encephalopathies, or medication effect.  The absence of epileptiform discharges does not rule out a clinical diagnosis of epilepsy.  Clinical correlation is advised.   Ellouise Newer, M.D.

## 2017-09-09 NOTE — Progress Notes (Signed)
ANTICOAGULATION CONSULT NOTE - Follow Up Consult  Pharmacy Consult for Heparin Indication: chest pain/ACS  No Known Allergies  Patient Measurements: Height: 5\' 6"  (167.6 cm) Weight: 211 lb 13.8 oz (96.1 kg) IBW/kg (Calculated) : 63.8  Vital Signs: Temp: 99 F (37.2 C) (01/14 0700) Temp Source: Core (01/14 0600) BP: 88/58 (01/14 0700) Pulse Rate: 67 (01/14 0700)  Labs: Recent Labs    09/07/17 0342  09/07/17 1432 09/08/17 0201 09/08/17 1347 09/09/17 0416  HGB 17.6*  --   --  16.4  --  14.9  HCT 51.2  --   --  49.8  --  43.8  PLT 220  --   --  217  --  168  HEPARINUNFRC  --    < > 0.71* 0.34 0.30 0.23*  CREATININE 2.70*  --  2.76* 2.89*  --  3.14*   < > = values in this interval not displayed.    Estimated Creatinine Clearance: 26.1 mL/min (A) (by C-G formula based on SCr of 3.14 mg/dL (H)).   Medical History: Past Medical History:  Diagnosis Date  . AICD (automatic cardioverter/defibrillator) present   . Ankle injury    RIGHT  . Arthritis   . BPH (benign prostatic hypertrophy)   . Cancer ([place])   . Coronary artery disease   . Diabetes mellitus without complication ([place])    TYPE 2  . Dysrhythmia   . Glaucoma   . H/O: knee surgery    2 PINS IN RIGHT KNEE AND IN RIGHT ANKLE  . Hyperlipidemia   . Hypertension   . Mass of lung    RIGHT UPPER LOBE  . Myocardial infarction ([place])   . 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 Shortness of breath    WITH EXERTION     Assessment: 61yom Hx CAD s/p CABG 2002, ICM with ICD admitted s/p VF arrest (s/p TTM) per pacemaker interrogation.Troponin bump to 70  - started heparin for possible ACS.  -heparin level is 0.23 and below goal  Goal of Therapy:  Heparin level 0.3-0.7 units/ml Monitor platelets by anticoagulation protocol: Yes   Plan:  Increase Heparin drip to 1200 units/hr HL in 8hr  Daily HL, CBC  Hildred Laser, Pharm D 09/09/2017 8:03 AM

## 2017-09-09 NOTE — Progress Notes (Signed)
ANTICOAGULATION CONSULT NOTE - Follow Up Consult  Pharmacy Consult for Heparin Indication: chest pain/ACS  No Known Allergies  Patient Measurements: Height: 5\' 6"  (167.6 cm) Weight: 211 lb 13.8 oz (96.1 kg) IBW/kg (Calculated) : 63.8  Vital Signs: Temp: 98.6 F (37 C) (01/14 1900) Temp Source: Bladder (01/14 0800) BP: 97/63 (01/14 1900) Pulse Rate: 71 (01/14 1900)  Labs: Recent Labs    09/07/17 0342  09/07/17 1432 09/08/17 0201 09/08/17 1347 09/09/17 0416 09/09/17 1833  HGB 17.6*  --   --  16.4  --  14.9  --   HCT 51.2  --   --  49.8  --  43.8  --   PLT 220  --   --  217  --  168  --   HEPARINUNFRC  --    < > 0.71* 0.34 0.30 0.23* 0.50  CREATININE 2.70*  --  2.76* 2.89*  --  3.14*  --    < > = values in this interval not displayed.    Estimated Creatinine Clearance: 26.1 mL/min (A) (by C-G formula based on SCr of 3.14 mg/dL (H)).  Assessment: 61yom Hx CAD s/p CABG 2002, ICM with ICD admitted s/p VF arrest (s/p TTM) per pacemaker interrogation.Troponin bump to 70  - started heparin for possible ACS.   Heparin level now at goal  Goal of Therapy:  Heparin level 0.3-0.7 units/ml Monitor platelets by anticoagulation protocol: Yes   Plan:  Continue heparin at 1200 units / hr Follow up AM labs  Thank you Anette Guarneri, PharmD [phone] I1/14/2019 7:38 PM

## 2017-09-09 NOTE — Progress Notes (Signed)
Pt placed back on rest mode sue to being tired. Pt weaned 10 hours today on 10/5

## 2017-09-09 NOTE — Progress Notes (Signed)
EEG completed; results pending.    

## 2017-09-09 NOTE — Progress Notes (Signed)
PPM/ICD  [place]  [place]  Interrogation  Requested, d/w  Nursing  Staff,  Sensing or  [place] appropriate  Spikes  Noted. amio gtt  Temporarily  held

## 2017-09-10 ENCOUNTER — Inpatient Hospital Stay (HOSPITAL_COMMUNITY): Payer: Medicare Other

## 2017-09-10 DIAGNOSIS — I255 Ischemic cardiomyopathy: Secondary | ICD-10-CM | POA: Diagnosis present

## 2017-09-10 DIAGNOSIS — N171 Acute kidney failure with acute cortical necrosis: Secondary | ICD-10-CM | POA: Diagnosis present

## 2017-09-10 DIAGNOSIS — N170 Acute kidney failure with tubular necrosis: Secondary | ICD-10-CM

## 2017-09-10 DIAGNOSIS — I5043 Acute on chronic combined systolic (congestive) and diastolic (congestive) heart failure: Secondary | ICD-10-CM | POA: Diagnosis present

## 2017-09-10 DIAGNOSIS — I25119 Atherosclerotic heart disease of native coronary artery with unspecified angina pectoris: Secondary | ICD-10-CM

## 2017-09-10 DIAGNOSIS — I2510 Atherosclerotic heart disease of native coronary artery without angina pectoris: Secondary | ICD-10-CM

## 2017-09-10 DIAGNOSIS — I25709 Atherosclerosis of coronary artery bypass graft(s), unspecified, with unspecified angina pectoris: Secondary | ICD-10-CM | POA: Diagnosis present

## 2017-09-10 DIAGNOSIS — I214 Non-ST elevation (NSTEMI) myocardial infarction: Principal | ICD-10-CM | POA: Diagnosis present

## 2017-09-10 DIAGNOSIS — T8111XA Postprocedural  cardiogenic shock, initial encounter: Secondary | ICD-10-CM

## 2017-09-10 DIAGNOSIS — R570 Cardiogenic shock: Secondary | ICD-10-CM | POA: Diagnosis present

## 2017-09-10 DIAGNOSIS — Z4502 Encounter for adjustment and management of automatic implantable cardiac defibrillator: Secondary | ICD-10-CM

## 2017-09-10 DIAGNOSIS — G931 Anoxic brain damage, not elsewhere classified: Secondary | ICD-10-CM | POA: Diagnosis present

## 2017-09-10 DIAGNOSIS — I252 Old myocardial infarction: Secondary | ICD-10-CM

## 2017-09-10 LAB — CBC WITH DIFFERENTIAL/PLATELET
BASOS ABS: 0 10*3/uL (ref 0.0–0.1)
Basophils Relative: 0 %
EOS PCT: 0 %
Eosinophils Absolute: 0 10*3/uL (ref 0.0–0.7)
HCT: 42 % (ref 39.0–52.0)
Hemoglobin: 14.4 g/dL (ref 13.0–17.0)
Lymphocytes Relative: 4 %
Lymphs Abs: 0.6 10*3/uL — ABNORMAL LOW (ref 0.7–4.0)
MCH: 27.2 pg (ref 26.0–34.0)
MCHC: 34.3 g/dL (ref 30.0–36.0)
MCV: 79.4 fL (ref 78.0–100.0)
Monocytes Absolute: 1.7 10*3/uL — ABNORMAL HIGH (ref 0.1–1.0)
Monocytes Relative: 10 %
NEUTROS PCT: 86 %
Neutro Abs: 14.8 10*3/uL — ABNORMAL HIGH (ref 1.7–7.7)
PLATELETS: 125 10*3/uL — AB (ref 150–400)
RBC: 5.29 MIL/uL (ref 4.22–5.81)
RDW: 17.3 % — ABNORMAL HIGH (ref 11.5–15.5)
WBC: 17.2 10*3/uL — AB (ref 4.0–10.5)

## 2017-09-10 LAB — BASIC METABOLIC PANEL
Anion gap: 15 (ref 5–15)
BUN: 123 mg/dL — AB (ref 6–20)
CO2: 25 mmol/L (ref 22–32)
CREATININE: 3.4 mg/dL — AB (ref 0.61–1.24)
Calcium: 7.9 mg/dL — ABNORMAL LOW (ref 8.9–10.3)
Chloride: 99 mmol/L — ABNORMAL LOW (ref 101–111)
GFR calc Af Amer: 21 mL/min — ABNORMAL LOW (ref >60)
GFR, EST NON AFRICAN AMERICAN: 18 mL/min — AB (ref >60)
Glucose, Bld: 168 mg/dL — ABNORMAL HIGH (ref 65–99)
POTASSIUM: 3 mmol/L — AB (ref 3.5–5.1)
SODIUM: 139 mmol/L (ref 135–145)

## 2017-09-10 LAB — PHOSPHORUS: PHOSPHORUS: 5.1 mg/dL — AB (ref 2.5–4.6)

## 2017-09-10 LAB — GLUCOSE, CAPILLARY
GLUCOSE-CAPILLARY: 167 mg/dL — AB (ref 65–99)
GLUCOSE-CAPILLARY: 165 mg/dL — AB (ref 65–99)
Glucose-Capillary: 163 mg/dL — ABNORMAL HIGH (ref 65–99)
Glucose-Capillary: 162 mg/dL — ABNORMAL HIGH (ref 65–99)
Glucose-Capillary: 205 mg/dL — ABNORMAL HIGH (ref 65–99)
Glucose-Capillary: 187 mg/dL — ABNORMAL HIGH (ref 65–99)

## 2017-09-10 LAB — HEPARIN LEVEL (UNFRACTIONATED): HEPARIN UNFRACTIONATED: 0.5 [IU]/mL (ref 0.30–0.70)

## 2017-09-10 LAB — MAGNESIUM: Magnesium: 1.9 mg/dL (ref 1.7–2.4)

## 2017-09-10 MED ORDER — MAGNESIUM SULFATE IN D5W 1-5 GM/100ML-% IV SOLN
1.0000 g | Freq: Once | INTRAVENOUS | Status: AC
  Administered 2017-09-10: 1 g via INTRAVENOUS
  Filled 2017-09-10: qty 100

## 2017-09-10 MED ORDER — POTASSIUM CHLORIDE 20 MEQ/15ML (10%) PO SOLN
40.0000 meq | Freq: Once | ORAL | Status: AC
  Administered 2017-09-10: 40 meq
  Filled 2017-09-10: qty 30

## 2017-09-10 MED ORDER — AMIODARONE HCL 200 MG PO TABS
400.0000 mg | ORAL_TABLET | Freq: Two times a day (BID) | ORAL | Status: DC
  Administered 2017-09-10: 400 mg via ORAL
  Filled 2017-09-10 (×2): qty 2

## 2017-09-10 NOTE — Progress Notes (Signed)
ANTICOAGULATION CONSULT NOTE - Follow Up Consult  Pharmacy Consult for Heparin Indication: chest pain/ACS  No Known Allergies  Patient Measurements: Height: 5\' 6"  (167.6 cm) Weight: 202 lb 6.1 oz (91.8 kg) IBW/kg (Calculated) : 63.8  Vital Signs: Temp: 97.3 F (36.3 C) (01/15 1109) BP: 99/62 (01/15 1000) Pulse Rate: 63 (01/15 1000)  Labs: Recent Labs    09/08/17 0201  09/09/17 0416 09/09/17 1833 09/10/17 0212  HGB 16.4  --  14.9  --  14.4  HCT 49.8  --  43.8  --  42.0  PLT 217  --  168  --  125*  HEPARINUNFRC 0.34   < > 0.23* 0.50 0.50  CREATININE 2.89*  --  3.14* 3.39* 3.40*   < > = values in this interval not displayed.    Estimated Creatinine Clearance: 23.6 mL/min (A) (by C-G formula based on SCr of 3.4 mg/dL (H)).   Medical History: Past Medical History:  Diagnosis Date  . AICD (automatic cardioverter/defibrillator) present   . Ankle injury    RIGHT  . Arthritis   . BPH (benign prostatic hypertrophy)   . Cancer ([place])   . Coronary artery disease   . Diabetes mellitus without complication ([place])    TYPE 2  . Dysrhythmia   . Glaucoma   . H/O: knee surgery    2 PINS IN RIGHT KNEE AND IN RIGHT ANKLE  . Hyperlipidemia   . Hypertension   . Mass of lung    RIGHT UPPER LOBE  . Myocardial infarction ([place])   . 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 Shortness of breath    WITH EXERTION     Assessment: 61yom Hx CAD s/p CABG 2002, ICM with ICD admitted s/p VF arrest (s/p TTM) per pacemaker interrogation.Troponin bump to 70  - started heparin for possible ACS and concern of LV thrombus  -heparin level is at goal on 1200 units/hr  Goal of Therapy:  Heparin level 0.3-0.7 units/ml Monitor platelets by anticoagulation protocol: Yes   Plan:  -No heparin changes needed -Daily heparin level and CBC   Hildred Laser, Pharm D 09/10/2017 12:50 PM

## 2017-09-10 NOTE — Progress Notes (Signed)
[facility] ADULT ICU REPLACEMENT PROTOCOL FOR AM LAB REPLACEMENT ONLY  The patient does not apply for the [facility] Adult ICU Electrolyte Replacment Protocol based on the criteria listed below:    Is GFR >/= 40 ml/min? No.  Patient's GFR today is 21   Abnormal electrolyte(s): K3.0,Mg1.9   If a panic level lab has been reported, has the CCM MD in charge been notified? Yes.  .   Physician:  S Sommer,MD  Vear Clock 09/10/2017 5:09 AM

## 2017-09-10 NOTE — Progress Notes (Signed)
Progress Note  Patient Name: Mark Martin Date of Encounter: 09/10/2017  Primary Cardiologist: Otho Perl [facility])  Subjective   More awake today. Opens eyes to command. Responds to simple commands.    Inpatient Medications    Scheduled Meds: . aspirin  81 mg Per Tube Daily  . atorvastatin  40 mg Per Tube q1800  . chlorhexidine gluconate (MEDLINE KIT)  15 mL Mouth Rinse BID  . Chlorhexidine Gluconate Cloth  6 each Topical Daily  . feeding supplement (PRO-STAT SUGAR FREE 64)  60 mL Per Tube BID  . feeding supplement (VITAL HIGH PROTEIN)  1,000 mL Per Tube Q24H  . furosemide  80 mg Intravenous Q6H  . insulin aspart  0-9 Units Subcutaneous Q4H  . mouth rinse  15 mL Mouth Rinse 10 times per day  . pantoprazole (PROTONIX) IV  40 mg Intravenous QHS  . sodium chloride flush  10-40 mL Intracatheter Q12H  . ticagrelor  90 mg Per Tube BID   Continuous Infusions: . cefTRIAXone (ROCEPHIN)  IV 1 g (09/10/17 1701)  . heparin 1,200 Units/hr (09/09/17 2009)   PRN Meds: Influenza vac split quadrivalent PF, sodium chloride flush   Vital Signs    Vitals:   09/10/17 1400 09/10/17 1500 09/10/17 1600 09/10/17 1700  BP: 104/71 113/73 105/74 97/65  Pulse: 77 75 74 76  Resp: '17 17 19 16  '$ Temp: (!) 97.5 F (36.4 C) (!) 97.3 F (36.3 C) (!) 97.3 F (36.3 C) (!) 97.3 F (36.3 C)  TempSrc:      SpO2: 100% 100% 100% 99%  Weight:      Height:        Intake/Output Summary (Last 24 hours) at 09/10/2017 1713 Last data filed at 09/10/2017 1600 Gross per 24 hour  Intake 3488.03 ml  Output 4350 ml  Net -861.97 ml   Filed Weights   09/08/17 0600 09/09/17 0500 09/10/17 0448  Weight: 220 lb 10.2 oz (100.1 kg) 211 lb 13.8 oz (96.1 kg) 202 lb 6.1 oz (91.8 kg)    Physical Exam   Physical Exam  Constitutional: He appears well-developed and well-nourished. No distress.  Intubated, not sedated.   Cardiovascular: Normal rate, regular rhythm, S1 normal, S2 normal and intact distal pulses.   Extrasystoles are present. PMI is displaced (Mildly laterally displaced and sustained). Exam reveals distant heart sounds. Exam reveals no friction rub.  Murmur heard.  Medium-pitched harsh early systolic murmur is present at the upper right sternal border radiating to the neck. Cannot actually determine gallop because of paced beats, but does appear to have an S4 gallop.  Pulmonary/Chest: No respiratory distress.  Remains intubated with coarse diffuse ventilator related rhonchorous breath sounds.  No wheezing or rales  Abdominal: Soft. Bowel sounds are normal. He exhibits no distension. There is no tenderness.  Somewhat diminished bowel sounds, but present.  Musculoskeletal: He exhibits edema (Still has 1-2+ lower extremity edema).  Neurological: He is alert.   He is awake and responsive to voice and tactile commands. --Somewhat limited by language barrier.  Responded to his wife and daughter.  Is moving all 4 extremities.  Tracks to voice.  Skin: Skin is warm and dry. No erythema.  Psychiatric:  Easily agitated  Nursing note and vitals reviewed.   Labs    Chemistry Recent Labs  Lab 09/05/17 1018  09/09/17 0416 09/09/17 1833 09/10/17 0212  NA 136   < > 137 137 139  K 4.1   < > 3.8 3.3* 3.0*  CL  103   < > 101 99* 99*  CO2 17*   < > '22 23 25  '$ GLUCOSE 160*   < > 186* 135* 168*  BUN 23*   < > 104* 117* 123*  CREATININE 1.31*   < > 3.14* 3.39* 3.40*  CALCIUM 8.4*   < > 7.8* 7.9* 7.9*  PROT 5.5*  --   --   --   --   ALBUMIN 2.8*  --   --   --   --   AST 76*  --   --   --   --   ALT 25  --   --   --   --   ALKPHOS 121  --   --   --   --   BILITOT 1.3*  --   --   --   --   GFRNONAA 56*   < > 20* 18* 18*  GFRAA >60   < > 23* 21* 21*  ANIONGAP 16*   < > '14 15 15   '$ < > = values in this interval not displayed.     Hematology Recent Labs  Lab 09/08/17 0201 09/09/17 0416 09/10/17 0212  WBC 24.1* 23.4* 17.2*  RBC 6.24* 5.53 5.29  HGB 16.4 14.9 14.4  HCT 49.8 43.8 42.0  MCV  79.8 79.2 79.4  MCH 26.3 26.9 27.2  MCHC 32.9 34.0 34.3  RDW 17.5* 17.3* 17.3*  PLT 217 168 125*    Cardiac Enzymes Recent Labs  Lab 09/05/17 1244 09/05/17 1803 09/05/17 2322 09/06/17 0437  TROPONINI 9.73* 22.82* 49.82* 70.34*    Recent Labs  Lab 09/05/17 1025  TROPIPOC 4.05*     BNP Recent Labs  Lab 09/05/17 1018  BNP 921.9*     DDimer No results for input(s): DDIMER in the last 168 hours.   Radiology    Ct Head Wo Contrast  Result Date: 09/09/2017 CLINICAL DATA:  Altered level of consciousness. EXAM: CT HEAD WITHOUT CONTRAST TECHNIQUE: Contiguous axial images were obtained from the base of the skull through the vertex without intravenous contrast. COMPARISON:  09/07/2017; 07/06/2018 FINDINGS: Brain: Scattered periventricular hypodensities compatible microvascular ischemic disease. Old lacunar infarct within the left basal ganglia (image 21, series 5). Gray-white differentiation otherwise well maintained without CT evidence superimposed acute large territory infarct. No intraparenchymal extra-axial mass or hemorrhage. Unchanged size and configuration of the ventricles and the basilar cisterns. No midline shift. Vascular: There is diffuse increased attenuation intracranial blood pool suggestive of hypokalemia. Skull: No displaced calvarial fracture. Sinuses/Orbits: Limited visualization of the paranasal sinuses and mastoid air cells is normal. No air-fluid levels. Post bilateral cataract surgery. Other: Regional soft tissues appear normal.  Patient is intubated. IMPRESSION: Similar findings of microvascular ischemic disease without superimposed acute intracranial process. Electronically Signed   By: Sandi Mariscal M.D.   On: 09/09/2017 12:20   Dg Chest Port 1 View  Result Date: 09/09/2017 CLINICAL DATA:  Hypoxia EXAM: PORTABLE CHEST 1 VIEW COMPARISON:  September 08, 2017 FINDINGS: Endotracheal tube tip is 5.7 cm above the carina. Nasogastric tube tip and side port are below the  diaphragm. Pacemaker lead tips are attached the right atrium, right ventricle, and coronary sinus. No pneumothorax. There is atelectatic change in the left base with small left pleural effusion. Right lung is clear. Heart is mildly enlarged with pulmonary vascularity within normal limits. No adenopathy. No evident bone lesions. IMPRESSION: Tube positions as described without pneumothorax. Stable cardiac prominence. Left base atelectasis with small left pleural  effusion. Right lung clear. Electronically Signed   By: Lowella Grip III M.D.   On: 09/09/2017 07:37    Telemetry    Paced rhythm- Personally Reviewed  ECG     No new tracing- Personally Reviewed  Cardiac Studies    2D Echo 09/06/17: Dilated left ventricle with swirling flow in the LV apex but no obvious thrombus.  Severely reduced LV function with EF of 15%.  Diffuse hypokinesis throughout with segmental differences.  GRII DD.  Mild AI with possibly mild AS.  Mod Pulm HTN Peak PA pressures 50 mmHg.  Patient Profile     62 y.o. male with CABG x5( '02) and DES to mid Cx 2016, ICM, HTN, HL, DM, Lung mass s/p VATS who presented with out of hospital cardiac arrest.  Assessment & Plan    1. Cardiac Arrest with VT>>VF with ICD shock: Unfortunately do not know his baseline EF, but current EF is 50%.  He has existing Environmental health practitioner AICD in place.  V amiodarone was discontinued last night because of concern for bradycardia (despite the patient having an ICD/pacer in place) --> will convert to '400mg'$  PO twice daily for 1 week and then once daily for 1 week prior to going down to 200 mg daily.   2. CAD s/p CABG: Clearly suspect that this is possibly an ischemic event given the troponin elevation to greater than 70 --would probably call this a non-STEMI (although physiologically is more like a STEMI) --> most likely event was probably an occluded vein graft which may or may not leave any revascularization options.  On ASA, Brilinta,statin  added this admission)  I suspect that we can probably stop IV heparin as he is now well beyond the 72-hour treatment window for MI, however, with severe ischemic cardia myopathy, there is something to be set about considering full anticoagulation as formation of apical thrombus is a potential concern. ->  Will discuss with heart failure doctors.  No plans for now to pursue ischemic evaluation in the setting of acute renal insufficiency. -->  Depending on what his renal function does may or may not consider ischemic evaluation during his hospitalization.  3. Ischemic cardiomyopathy s/p St. Jude PPM/ICD -- with resolved cardiogenic shock following cardiac arrest: -Known ischemic cardiomyopathy with St. Jude. Echo this admission with EF 15%. - In setting of cardiac arrest and encouraging shock was in Class III-IV acute on chronic combined systolic and diastolic heart failure.   Has been aggressively diuresed, last CVP yesterday morning was 16-17 mmHg.  Would indicate that he still has excess volume overload,   would probably consider backing down to maybe twice daily Lasix tomorrow.  Unfortunately, his blood pressure is not yet stabilized enough to be a consider beta-blocker or ARB.  In fact ARB  is contraindicated with acute renal insufficiency.)  4. AKI:Cr, 3.4 today -having reached a plateau.  He still is making good urine output on high-dose Lasix.  Suspect this is related to ATN in setting of shock and heart failure.  5. Respiratory Failure: Per [facility] M -Remains intubated, does not meet extubation criteria in the setting of ARF r/t acute encephalopathy  Anticipate that as he is becoming more awake and alert, he will be moving toward weaning trials.  Still has concern for anoxic brain injury, however he continues to be recovering very well and is becoming more more responsive.   I actually spent about 15 minutes talking to the patient's family about his condition and prognosis and the  options  and treatment plan from a cardiac standpoint.  I did not go into details of his pulmonary and respiratory status.  I did briefly discuss renal insufficiency and the concerns there.  Overall he is critically ill, but steadily improving.  Multiple organ system failure requiring extensive thought process.  Thankfully mostly critical care is being performed by the critical care team with [place] as consultants.  Skeleton note written by Kathyrn Drown, NP as described. --Physical exam, assessment of telemetry, assessment and plan are all per Dr. Ellyn Hack.  Signed, Glenetta Hew, M.D., M.S. Interventional Cardiologist   Pager # [phone] Phone # [phone] [address] [place], [place] [address]  09/10/2017, 5:13 PM   For questions or updates, please contact   Please consult [email or web address] for contact info under Cardiology/STEMI.

## 2017-09-10 NOTE — Progress Notes (Signed)
[facility] Progress Note Patient Name: Mark Martin DOB: Aug 07, 1956 MRN: [id number]   Date of Service  09/10/2017  HPI/Events of Note  K+ = 3.0, Mg++ = 1.9 and Creatinine = 3.4. Patient being actively diuresed with Lasix.   eICU Interventions  Will order: 1. Replace K+ and Mg++.     Intervention Category Major Interventions: Electrolyte abnormality - evaluation and management  Virginio Isidore Eugene 09/10/2017, 5:15 AM

## 2017-09-10 NOTE — Progress Notes (Signed)
PULMONARY / CRITICAL CARE MEDICINE   Name: Mark Martin MRN: [id number] DOB: 1956/07/25    ADMISSION DATE:  09/05/2017   REFERRING MD: Emergency department physician  CHIEF COMPLAINT: Cardiac arrest  BRIEF  Mark Martin is a 62 year old male with known ischemic cardiomyopathy, ejection fraction less than 20%, with an AICD in place.  Uses his usual state of health until 09/05/2017 at which time he was noted to go down this was witnessed.  Family started CPR EMS arrived and return of spontaneous circulation within 25 minutes with 1 dose of epinephrine.  His AICD was interrogated and showed 25 minutes of VT in PEA.  His AICD did shock him x1. He is transferred to [facility] emergency department he was intubated and started on epinephrine drip.  He was seen by cardiology.  He had multiple episodes of PEA requiring increasing doses of epinephrine. Pulmonary critical care at bedside arterial line and central line were placed into the right femoral artery and vein respectively.  Levophed was added to his pharmaceutical regimen along with amiodarone. He was stabilized in the emergency department he will be placed on hypothermia protocol transferred to the coronary intensive care unit.   STUDIES:  09/05/2017 2D echo will be performed>> 09/05/2017 CT of the head without acute intracranial abnormality  CULTURES: Blood 1/12>>>NTD Procal 7.14  ANTIBIOTICS: Rocephin 1/12>>> LINES/TUBES: 09/05/2017 endotracheal tube>> 09/05/2017 right femoral central line>> 09/05/2017 right femoral arterial line>>  DISCUSSION: 62 year old with extensive cardiac history presenting post cardiac arrest.    SIGNIFICANT EVENTS: 09/05/2017 VT PEA arrest 09/08/17 - Pressor demand decreasing, no events overnight 09/09/17 - worsening AKI. Family at beside. On vent. Daughter at bedside and wife report less responsiveness x 48h/ Last prn sedation of fent/versed on 09/06/17 - per RN moves all 4s, non purposeful and weaning on  SBT. Off pressors. Has femoral lines    SUBJECTIVE/OVERNIGHT/INTERVAL HX 09/10/2017 -> pacer interrogated and noted to be ok. EEG  Yesterday - diffuse slowing; non specific etiology patter. No epileptiform discharge. AKI appears stabilizing at 3.4. PCT improving. Mental status some better; opens eyss and gazes and apparently followed some commands. Doing PSV + but RT say s needing higher support than yesterday and lower Vt   VITAL SIGNS: BP 96/64   Pulse 65   Temp (!) 97.5 F (36.4 C)   Resp 13   Ht 5\' 6"  (1.676 m)   Wt 91.8 kg (202 lb 6.1 oz)   SpO2 100%   BMI 32.67 kg/m   HEMODYNAMICS:    VENTILATOR SETTINGS: Vent Mode: CPAP;PSV FiO2 (%):  [30 %] 30 % Set Rate:  [18 bmp] 18 bmp Vt Set:  [510 mL] 510 mL PEEP:  [5 cmH20] 5 cmH20 Pressure Support:  [10 cmH20-12 cmH20] 12 cmH20 Plateau Pressure:  [17 cmH20-18 cmH20] 18 cmH20  INTAKE / OUTPUT: I/O last 3 completed shifts: In: 2513 [I.V.:988; NG/GT:1375; IV Piggyback:150] Out: 8550 [Urine:8550]  PHYSICAL EXAMINATION:   General Appearance:    Looks criticall ill  Head:    Normocephalic, without obvious abnormality, atraumatic  Eyes:    PERRL - yes, conjunctiva/corneas -  clear      Ears:    Normal external ear canals, both ears  Nose:   NG tube - no  Throat:  ETT TUBE - yes , OG tube - yes  Neck:   Supple,  No enlargement/tenderness/nodules     Lungs:     Clear to auscultation bilaterally, Ventilator   Synchrony - yes on PSV  Chest  wall:    No deformity  Heart:    S1 and S2 normal, no murmur, CVP - no.  Pressors - no  Abdomen:     Soft, no masses, no organomegaly  Genitalia:    Not done  Rectal:   not done  Extremities:   Extremities- intact     Skin:   Intact in exposed areas .      Neurologic:   Sedation - none -> RASS - 0 . Moves all 4s - yes. CAM-ICU - na . Orientation - not oriented but opening eyes for first time, fixed stared and nodded once to name     ` LABS: PULMONARY Recent Labs  Lab  09/05/17 1656 09/05/17 2337 09/06/17 0508 09/07/17 0357 09/08/17 0345 09/08/17 0512 09/09/17 0506  PHART 7.212* 7.234* 7.301* 7.447 7.435 7.412 7.436  PCO2ART 30.8* 31.2* 28.5* 25.7* 29.1* 29.5* 34.1  PO2ART 90.0 144.0* 179* 134.0* 116* 99.0 125.0*  HCO3 12.8* 13.4* 13.6* 17.8* 19.2* 18.8* 22.9  TCO2 14* 14*  --  19*  --  20* 24  O2SAT 96.0 99.0 98.9 99.0 97.8 98.0 99.0    CBC Recent Labs  Lab 09/08/17 0201 09/09/17 0416 09/10/17 0212  HGB 16.4 14.9 14.4  HCT 49.8 43.8 42.0  WBC 24.1* 23.4* 17.2*  PLT 217 168 125*    COAGULATION Recent Labs  Lab 09/05/17 1018 09/05/17 1803  INR 1.34 1.38    CARDIAC   Recent Labs  Lab 09/05/17 1018 09/05/17 1244 09/05/17 1803 09/05/17 2322 09/06/17 0437  TROPONINI 2.65* 9.73* 22.82* 49.82* 70.34*   No results for input(s): PROBNP in the last 168 hours.   CHEMISTRY Recent Labs  Lab 09/06/17 0437 09/07/17 0342 09/07/17 1432 09/08/17 0201 09/09/17 0416 09/09/17 1833 09/10/17 0212  NA 136 127* 133* 134* 137 137 139  K 4.7 5.5* 5.0 3.9 3.8 3.3* 3.0*  CL 104 98* 101 99* 101 99* 99*  CO2 12* 15* 17* 18* 22 23 25   GLUCOSE 188* 154* 90 211* 186* 135* 168*  BUN 33* 48* 56* 78* 104* 117* 123*  CREATININE 2.14* 2.70* 2.76* 2.89* 3.14* 3.39* 3.40*  CALCIUM 8.0* 7.6* 7.5* 7.8* 7.8* 7.9* 7.9*  MG 1.8 2.1  --  2.2 2.2  --  1.9  PHOS 6.6* 5.7*  --  6.9* 5.1*  --  5.1*   Estimated Creatinine Clearance: 23.6 mL/min (A) (by C-G formula based on SCr of 3.4 mg/dL (H)).   LIVER Recent Labs  Lab 09/05/17 1018 09/05/17 1803  AST 76*  --   ALT 25  --   ALKPHOS 121  --   BILITOT 1.3*  --   PROT 5.5*  --   ALBUMIN 2.8*  --   INR 1.34 1.38     INFECTIOUS Recent Labs  Lab 09/05/17 1027 09/07/17 0924 09/08/17 0201 09/09/17 0416  LATICACIDVEN 5.78*  --   --   --   PROCALCITON  --  7.14 6.27 5.66     ENDOCRINE CBG (last 3)  Recent Labs    09/09/17 2328 09/10/17 0407 09/10/17 0733  GLUCAP 163* 167* 162*          IMAGING x48h  - image(s) personally visualized  -   highlighted in bold Ct Head Wo Contrast  Result Date: 09/09/2017 CLINICAL DATA:  Altered level of consciousness. EXAM: CT HEAD WITHOUT CONTRAST TECHNIQUE: Contiguous axial images were obtained from the base of the skull through the vertex without intravenous contrast. COMPARISON:  09/07/2017; 07/06/2018 FINDINGS: Brain: Scattered periventricular  hypodensities compatible microvascular ischemic disease. Old lacunar infarct within the left basal ganglia (image 21, series 5). Gray-white differentiation otherwise well maintained without CT evidence superimposed acute large territory infarct. No intraparenchymal extra-axial mass or hemorrhage. Unchanged size and configuration of the ventricles and the basilar cisterns. No midline shift. Vascular: There is diffuse increased attenuation intracranial blood pool suggestive of hypokalemia. Skull: No displaced calvarial fracture. Sinuses/Orbits: Limited visualization of the paranasal sinuses and mastoid air cells is normal. No air-fluid levels. Post bilateral cataract surgery. Other: Regional soft tissues appear normal.  Patient is intubated. IMPRESSION: Similar findings of microvascular ischemic disease without superimposed acute intracranial process. Electronically Signed   By: Sandi Mariscal M.D.   On: 09/09/2017 12:20   Dg Chest Port 1 View  Result Date: 09/09/2017 CLINICAL DATA:  Hypoxia EXAM: PORTABLE CHEST 1 VIEW COMPARISON:  September 08, 2017 FINDINGS: Endotracheal tube tip is 5.7 cm above the carina. Nasogastric tube tip and side port are below the diaphragm. Pacemaker lead tips are attached the right atrium, right ventricle, and coronary sinus. No pneumothorax. There is atelectatic change in the left base with small left pleural effusion. Right lung is clear. Heart is mildly enlarged with pulmonary vascularity within normal limits. No adenopathy. No evident bone lesions. IMPRESSION: Tube positions  as described without pneumothorax. Stable cardiac prominence. Left base atelectasis with small left pleural effusion. Right lung clear. Electronically Signed   By: Lowella Grip III M.D.   On: 09/09/2017 07:37       ASSESSMENT / PLAN:  PULMONARY A: Vent dependent respiratory failure secondary to PEA cardiac arrest History of left adenocarcinoma with resection in 2014  09/10/2017 - > doing  SBT but does not meet Extubation ctieria in setting of Acute Respiratory Failure due to acute encephalopathy though improving and needing Higher PSV   P:   Continue PS trials but no extubation given mental status D/C all sedation; can do dipivan if needed Adjust vent for ABG Titrate O2 for sat of 8-92%  CARDIOVASCULAR A:  Status post VT PEA arrest Cardiomyopathy with known ejection fraction less than 20% with AICD in place ST elevation MI Shock per AICD for VT PEA Cardiogenic shock   09/10/2017 ->  not on Pressor . Cards following. On IV heparin gtt . Off amio gtt  P:  Cardiology consult, appreciate input Continue Amiodarone drip reordered 09/06/2017 MAP goal > 65 Continue high dose lasix ; helping and can help him towards extubation  RENAL Lab Results  Component Value Date   CREATININE 3.40 (H) 09/10/2017   CREATININE 3.39 (H) 09/09/2017   CREATININE 3.14 (H) 09/09/2017   Recent Labs  Lab 09/09/17 0416 09/09/17 1833 09/10/17 0212  K 3.8 3.3* 3.0*   A:   Renal insufficiency   Worsening renal function but rate of climb creatining plateauing on 09/10/2017   P:   continue lasix to 80mg  Q6H from BID Follow renal functions Replete electrolytes as needed KVO IVF   GASTROINTESTINAL A:   GI protection P:   PPI TF per nutrition  HEMATOLOGIC Recent Labs    09/09/17 0416 09/10/17 0212  HGB 14.9 14.4   A:   Anticoagulation per cardiology P:  Per cardiology Heparin for DVT prophylaxis  INFECTIOUS Recent Labs  Lab 09/07/17 0924 09/08/17 0201  09/09/17 0416  PROCALCITON 7.14 6.27 5.66    A:   No overt infectious process but has PCT trending down P:   Monitor fever and white count curve PCT 7.14 Blood cultures NTD Rocephin  1/12>>>  ENDOCRINE CBG (last 3)  Recent Labs    09/09/17 2328 09/10/17 0407 09/10/17 0733  GLUCAP 163* 167* 162*   A:   Hyperglycemia P:   Sliding scale insulin  NEUROLOGIC A:   Post PEA arrest does not follow commands. Head CT 1/14 and EEG 1/14 - non specific   09/10/2017 - improving slowly   P:   RASS goal: 0 Sedation as needed   FAMILY  - Updates: family daughter and wife updated 09/09/2017. Not at bedside at time of MD rounds 09/10/2017   - Inter-disciplinary family meet or Palliative Care meeting due by:  day 7    The patient is critically ill with multiple organ systems failure and requires high complexity decision making for assessment and support, frequent evaluation and titration of therapies, application of advanced monitoring technologies and extensive interpretation of multiple databases.   Critical Care Time devoted to patient care services described in this note is  30  Minutes. This time reflects time of care of this signee Dr Brand Males. This critical care time does not reflect procedure time, or teaching time or supervisory time of PA/NP/Med student/Med Resident etc but could involve care discussion time    Dr. Brand Males, M.D., [facility].C.P Pulmonary and Critical Care Medicine Staff Physician [facility] Pager: [phone], If no answer or between  15:00h - 7:00h: call [phone]  09/10/2017 10:12 AM

## 2017-09-11 ENCOUNTER — Inpatient Hospital Stay (HOSPITAL_COMMUNITY): Payer: Medicare Other

## 2017-09-11 DIAGNOSIS — J9690 Respiratory failure, unspecified, unspecified whether with hypoxia or hypercapnia: Secondary | ICD-10-CM

## 2017-09-11 LAB — BASIC METABOLIC PANEL
ANION GAP: 17 — AB (ref 5–15)
BUN: 127 mg/dL — AB (ref 6–20)
CALCIUM: 8.5 mg/dL — AB (ref 8.9–10.3)
CO2: 28 mmol/L (ref 22–32)
CREATININE: 3.07 mg/dL — AB (ref 0.61–1.24)
Chloride: 98 mmol/L — ABNORMAL LOW (ref 101–111)
GFR calc Af Amer: 23 mL/min — ABNORMAL LOW (ref >60)
GFR, EST NON AFRICAN AMERICAN: 20 mL/min — AB (ref >60)
Glucose, Bld: 225 mg/dL — ABNORMAL HIGH (ref 65–99)
Potassium: 3.1 mmol/L — ABNORMAL LOW (ref 3.5–5.1)
Sodium: 143 mmol/L (ref 135–145)

## 2017-09-11 LAB — CBC WITH DIFFERENTIAL/PLATELET
BASOS ABS: 0 10*3/uL (ref 0.0–0.1)
BASOS PCT: 0 %
EOS ABS: 0 10*3/uL (ref 0.0–0.7)
EOS PCT: 0 %
HEMATOCRIT: 43.4 % (ref 39.0–52.0)
Hemoglobin: 14.8 g/dL (ref 13.0–17.0)
Lymphocytes Relative: 4 %
Lymphs Abs: 0.6 10*3/uL — ABNORMAL LOW (ref 0.7–4.0)
MCH: 27.2 pg (ref 26.0–34.0)
MCHC: 34.1 g/dL (ref 30.0–36.0)
MCV: 79.8 fL (ref 78.0–100.0)
MONO ABS: 1.7 10*3/uL — AB (ref 0.1–1.0)
MONOS PCT: 11 %
NEUTROS ABS: 12.9 10*3/uL — AB (ref 1.7–7.7)
Neutrophils Relative %: 85 %
PLATELETS: 127 10*3/uL — AB (ref 150–400)
RBC: 5.44 MIL/uL (ref 4.22–5.81)
RDW: 17.6 % — AB (ref 11.5–15.5)
WBC: 15.2 10*3/uL — ABNORMAL HIGH (ref 4.0–10.5)

## 2017-09-11 LAB — GLUCOSE, CAPILLARY
GLUCOSE-CAPILLARY: 147 mg/dL — AB (ref 65–99)
GLUCOSE-CAPILLARY: 193 mg/dL — AB (ref 65–99)
GLUCOSE-CAPILLARY: 165 mg/dL — AB (ref 65–99)
Glucose-Capillary: 174 mg/dL — ABNORMAL HIGH (ref 65–99)
Glucose-Capillary: 201 mg/dL — ABNORMAL HIGH (ref 65–99)
Glucose-Capillary: 204 mg/dL — ABNORMAL HIGH (ref 65–99)

## 2017-09-11 LAB — PHOSPHORUS: PHOSPHORUS: 4.8 mg/dL — AB (ref 2.5–4.6)

## 2017-09-11 LAB — MAGNESIUM: MAGNESIUM: 2.3 mg/dL (ref 1.7–2.4)

## 2017-09-11 LAB — HEPARIN LEVEL (UNFRACTIONATED): HEPARIN UNFRACTIONATED: 0.7 [IU]/mL (ref 0.30–0.70)

## 2017-09-11 MED ORDER — DEXMEDETOMIDINE HCL IN NACL 400 MCG/100ML IV SOLN: 0.4000 ug/kg/h | INTRAVENOUS | Status: DC

## 2017-09-11 MED ORDER — AMIODARONE HCL 200 MG PO TABS: 400.0000 mg | ORAL_TABLET | Freq: Two times a day (BID) | ORAL | Status: DC

## 2017-09-11 MED ORDER — POTASSIUM CHLORIDE 20 MEQ/15ML (10%) PO SOLN
40.0000 meq | Freq: Two times a day (BID) | ORAL | Status: AC
  Administered 2017-09-11 (×2): 40 meq
  Filled 2017-09-11 (×2): qty 30

## 2017-09-11 MED ORDER — FENTANYL CITRATE (PF) 100 MCG/2ML IJ SOLN
25.0000 ug | INTRAMUSCULAR | Status: DC | PRN
  Administered 2017-09-11: 25 ug via INTRAVENOUS
  Administered 2017-09-11 – 2017-09-12 (×2): 50 ug via INTRAVENOUS
  Filled 2017-09-11 (×3): qty 2

## 2017-09-11 MED ORDER — FUROSEMIDE 10 MG/ML IJ SOLN: 80.0000 mg | Freq: Every day | INTRAMUSCULAR | Status: DC

## 2017-09-11 MED ORDER — ACETAMINOPHEN 160 MG/5ML PO SOLN
650.0000 mg | Freq: Four times a day (QID) | ORAL | Status: DC | PRN
  Administered 2017-09-11: 650 mg
  Filled 2017-09-11: qty 20.3

## 2017-09-11 NOTE — Progress Notes (Signed)
PULMONARY / CRITICAL CARE MEDICINE   Name: Kinnie Kaupp MRN: [id number] DOB: 05-29-1956    ADMISSION DATE:  09/05/2017   REFERRING MD: Emergency department physician  CHIEF COMPLAINT: Cardiac arrest  BRIEF  Mr.Speir is a 62 year old male with known ischemic cardiomyopathy, ejection fraction less than 20%, with an AICD in place.  Was in his usual state of health until 09/05/2017 when he had witnessed arrest.  Family started CPR EMS arrived and ROSC within 25 minutes with 1 dose of epinephrine.  His AICD was interrogated and showed 25 minutes of VT in PEA.  His AICD did shock him x1. He is transferred to [facility] emergency department where he was intubated and started on epinephrine drip.  He continued to have multiple episodes of PEA requiring increasing doses of epinephrine. PCCM called for admission and hypothermia protocol.     STUDIES:  09/05/2017 2D echo >>>EF 15%, possible early forming thrombus, severe diffuse hypokinesis, grade 2 diastolic dysfunction, PA peak pressure 17mmHg 09/05/2017 CT of the head >>>neg acute  1/15 EEG>>> diffuse slowing; non specific etiology patter. No epileptiform discharge.  CULTURES: Blood 1/12>>>NTD Procal 7.14 Sputum 1/12>>> normal flora   ANTIBIOTICS: Rocephin 1/12>>>  LINES/TUBES: 09/05/2017 ETT 09/05/2017 R fem CVL>>>  09/05/2017 right femoral aline>>>   SIGNIFICANT EVENTS: 09/05/2017 VT PEA arrest 09/08/17 - Pressor demand decreasing, no events overnight 09/09/17 - worsening AKI. Family at beside. On vent. Daughter at bedside and wife report less responsiveness x 48h/ Last prn sedation of fent/versed on 09/06/17 - per RN moves all 4s, non purposeful and weaning on SBT. Off pressors. Has femoral lines   SUBJECTIVE/OVERNIGHT/INTERVAL HX No acute change overnight.  Tolerating pressure support wean 5/5.  Looks good from respiratory standpoint.  More awake but not following commands.  VITAL SIGNS: BP 99/66   Pulse 78   Temp 98.1 F  (36.7 C) (Core)   Resp 17   Ht 5\' 6"  (1.676 m)   Wt 91.6 kg (201 lb 15.1 oz)   SpO2 100%   BMI 32.59 kg/m   HEMODYNAMICS:    VENTILATOR SETTINGS: Vent Mode: PSV;CPAP FiO2 (%):  [30 %] 30 % Set Rate:  [18 bmp] 18 bmp Vt Set:  [510 mL] 510 mL PEEP:  [5 cmH20] 5 cmH20 Pressure Support:  [5 cmH20-12 cmH20] 5 cmH20 Plateau Pressure:  [20 cmH20-23 cmH20] 22 cmH20  INTAKE / OUTPUT: I/O last 3 completed shifts: In: 2525.2 [I.V.:465.2; Other:700; NG/GT:1260; IV Piggyback:100] Out: 7725 [Urine:7725]  PHYSICAL EXAMINATION:   General: Chronically ill-appearing male, no acute distress on vent HEENT: MM pink/moist, ETT Neuro: Awake, alert, tracks, moving all extremities spontaneously, does not follow commands for me CV: s1s2 rrr, no m/r/g PULM: even/non-labored on PS 5/5, few bibasilar crackles otherwise clear LF:YBOF, non-tender, bsx4 active  Extremities: warm/dry, 1+ BLE edema  Skin: no rashes or lesions   LABS: PULMONARY Recent Labs  Lab 09/05/17 1656 09/05/17 2337 09/06/17 0508 09/07/17 0357 09/08/17 0345 09/08/17 0512 09/09/17 0506  PHART 7.212* 7.234* 7.301* 7.447 7.435 7.412 7.436  PCO2ART 30.8* 31.2* 28.5* 25.7* 29.1* 29.5* 34.1  PO2ART 90.0 144.0* 179* 134.0* 116* 99.0 125.0*  HCO3 12.8* 13.4* 13.6* 17.8* 19.2* 18.8* 22.9  TCO2 14* 14*  --  19*  --  20* 24  O2SAT 96.0 99.0 98.9 99.0 97.8 98.0 99.0    CBC Recent Labs  Lab 09/09/17 0416 09/10/17 0212 09/11/17 0245  HGB 14.9 14.4 14.8  HCT 43.8 42.0 43.4  WBC 23.4* 17.2* 15.2*  PLT 168 125* 127*  COAGULATION Recent Labs  Lab 09/05/17 1018 09/05/17 1803  INR 1.34 1.38    CARDIAC   Recent Labs  Lab 09/05/17 1018 09/05/17 1244 09/05/17 1803 09/05/17 2322 09/06/17 0437  TROPONINI 2.65* 9.73* 22.82* 49.82* 70.34*   No results for input(s): PROBNP in the last 168 hours.   CHEMISTRY Recent Labs  Lab 09/07/17 0342  09/08/17 0201 09/09/17 0416 09/09/17 1833 09/10/17 0212  09/11/17 0245  NA 127*   < > 134* 137 137 139 143  K 5.5*   < > 3.9 3.8 3.3* 3.0* 3.1*  CL 98*   < > 99* 101 99* 99* 98*  CO2 15*   < > 18* 22 23 25 28   GLUCOSE 154*   < > 211* 186* 135* 168* 225*  BUN 48*   < > 78* 104* 117* 123* 127*  CREATININE 2.70*   < > 2.89* 3.14* 3.39* 3.40* 3.07*  CALCIUM 7.6*   < > 7.8* 7.8* 7.9* 7.9* 8.5*  MG 2.1  --  2.2 2.2  --  1.9 2.3  PHOS 5.7*  --  6.9* 5.1*  --  5.1* 4.8*   < > = values in this interval not displayed.   Estimated Creatinine Clearance: 26.1 mL/min (A) (by C-G formula based on SCr of 3.07 mg/dL (H)).   LIVER Recent Labs  Lab 09/05/17 1018 09/05/17 1803  AST 76*  --   ALT 25  --   ALKPHOS 121  --   BILITOT 1.3*  --   PROT 5.5*  --   ALBUMIN 2.8*  --   INR 1.34 1.38     INFECTIOUS Recent Labs  Lab 09/05/17 1027 09/07/17 0924 09/08/17 0201 09/09/17 0416  LATICACIDVEN 5.78*  --   --   --   PROCALCITON  --  7.14 6.27 5.66     ENDOCRINE CBG (last 3)  Recent Labs    09/10/17 2354 09/11/17 0411 09/11/17 0754  GLUCAP 204* 201* 174*         IMAGING x48h  - image(s) personally visualized  -   highlighted in bold Ct Head Wo Contrast  Result Date: 09/09/2017 CLINICAL DATA:  Altered level of consciousness. EXAM: CT HEAD WITHOUT CONTRAST TECHNIQUE: Contiguous axial images were obtained from the base of the skull through the vertex without intravenous contrast. COMPARISON:  09/07/2017; 07/06/2018 FINDINGS: Brain: Scattered periventricular hypodensities compatible microvascular ischemic disease. Old lacunar infarct within the left basal ganglia (image 21, series 5). Gray-white differentiation otherwise well maintained without CT evidence superimposed acute large territory infarct. No intraparenchymal extra-axial mass or hemorrhage. Unchanged size and configuration of the ventricles and the basilar cisterns. No midline shift. Vascular: There is diffuse increased attenuation intracranial blood pool suggestive of hypokalemia.  Skull: No displaced calvarial fracture. Sinuses/Orbits: Limited visualization of the paranasal sinuses and mastoid air cells is normal. No air-fluid levels. Post bilateral cataract surgery. Other: Regional soft tissues appear normal.  Patient is intubated. IMPRESSION: Similar findings of microvascular ischemic disease without superimposed acute intracranial process. Electronically Signed   By: Sandi Mariscal M.D.   On: 09/09/2017 12:20       ASSESSMENT / PLAN:  PULMONARY A: Vent dependent respiratory failure secondary to PEA cardiac arrest History of left adenocarcinoma with resection in 2014 P:   Vent support - 8cc/kg  F/u CXR  F/u ABG Daily SBT  Respiratory status seems improved with ongoing diuresis, mental status is also improving but not quite amenable to extubation No extubation until improved mental status Diuresis as  below Titrate O2 for sat of 88-92%  CARDIOVASCULAR A:  Status post VT PEA arrest Ischemic cardiomyopathy - EF 15%, AICD in place  Acute on chronic combined systolic and diastolic heart failure  ST elevation MI Cardiogenic shock  P:  Cardiology following  Amiodarone changed to per tube  Asa, brilinta, statin  Holding ARB with AKI  Holding B blocker r/t hypotension  Continue cautious diuresis as BP and Scr tolerate  Continue heparin gtt for ACS - could potentially stop but need to reassess potential apical thrombus - will defer to cards for now  Continue high dose lasix ; helping and can help him towards extubation  RENAL AKI-in setting of cardiac arrest, hypotension.  Creatinine remains elevated but seems to have peaked and is now trending down slowly.  Making good urine.  Tolerating diuresis thus far.  P:   continue lasix to 80mg  Q6H with elevated CVP and improving Scr Follow renal functions Replete electrolytes as needed KVO IVF   GASTROINTESTINAL A:   No active issue  P:   PPI TF per nutrition  HEMATOLOGIC Anticoagulation per  cardiology P:  Per cardiology Heparin for DVT prophylaxis   INFECTIOUS A:   No overt infectious process, some mild concern for aspiration, PCT trending down P:   Monitor fever and white count curve PCT 7.14 Blood cultures NTD abx as above  Consider d/c abx in next 24 hrs  ENDOCRINE A:   Hyperglycemia P:   Sliding scale insulin  NEUROLOGIC A:   Post PEA arrest does not follow commands. Head CT 1/14 and EEG 1/14 - non specific P:   RASS goal: 0 Sedation as needed   FAMILY  - Updates: No family at bedside 1/16  - Inter-disciplinary family meet or Palliative Care meeting due by:  day 7   Nickolas Madrid, NP 09/11/2017  9:07 AM Pager: [phone] or [phone]

## 2017-09-11 NOTE — Progress Notes (Signed)
Inpatient Diabetes Program Recommendations  AACE/ADA: New Consensus Statement on Inpatient Glycemic Control (2015)  Target Ranges:  Prepandial:   less than 140 mg/dL      Peak postprandial:   less than 180 mg/dL (1-2 hours)      Critically ill patients:  140 - 180 mg/dL   Lab Results  Component Value Date   GLUCAP 174 (H) 09/11/2017   HGBA1C 8.9 (H) 06/05/2013    Review of Glycemic Control  Diabetes history: DM2 Outpatient Diabetes medications: Toujeo 50 units QD, metformin 1000 mg bid,  Current orders for Inpatient glycemic control: Novolog 0-9 units Q4H  Inpatient Diabetes Program Recommendations:     ICU Glycemic Control Protocol  Thank you. Lorenda Peck, RD, LDN, CDE Inpatient Diabetes Coordinator [phone]

## 2017-09-11 NOTE — Plan of Care (Signed)
  Cardiac: Ability to achieve and maintain adequate cardiopulmonary perfusion will improve 09/11/2017 1124 - Progressing by Jenne Campus, RN   Clinical Measurements: Will remain free from infection 09/11/2017 1124 - Progressing by Jenne Campus, RN   Clinical Measurements: Respiratory complications will improve 09/11/2017 1124 - Progressing by Jenne Campus, RN   Clinical Measurements: Cardiovascular complication will be avoided 09/11/2017 1124 - Progressing by Jenne Campus, RN   Skin Integrity: Risk for impaired skin integrity will decrease 09/11/2017 1124 - Progressing by Jenne Campus, RN

## 2017-09-11 NOTE — Progress Notes (Signed)
Pt found with OG tube out this am. Pt intermittently follows commands. OG reinserted and X ray ordered to confirm placement. Katie NP with CCM made aware will continue to monitor.

## 2017-09-11 NOTE — Progress Notes (Signed)
Pt has increase temp (102 degrees) awaiting orders to treat elevated temp

## 2017-09-11 NOTE — Progress Notes (Signed)
    Previous saw the patient today.  Plan is to hold amiodarone to allow for use of Precedex for sedation during spontaneous breathing trials. He was awake, but had to be re-sedated because of agitation. Otherwise has been stable. Creatinine seems to have plateaued, BUN is up.  I agree with backing off diuretic for now.  No new cardiology recommendations today.  We will follow along.   Glenetta Hew, MD

## 2017-09-11 NOTE — Progress Notes (Signed)
ANTICOAGULATION CONSULT NOTE - Follow Up Consult  Pharmacy Consult for Heparin Indication: chest pain/ACS  No Known Allergies  Patient Measurements: Height: 5\' 6"  (167.6 cm) Weight: 201 lb 15.1 oz (91.6 kg) IBW/kg (Calculated) : 63.8  Vital Signs: Temp: 98.1 F (36.7 C) (01/16 0800) Temp Source: Core (01/16 0800) BP: 99/66 (01/16 0800) Pulse Rate: 78 (01/16 0800)  Labs: Recent Labs    09/09/17 0416 09/09/17 1833 09/10/17 0212 09/11/17 0245  HGB 14.9  --  14.4 14.8  HCT 43.8  --  42.0 43.4  PLT 168  --  125* 127*  HEPARINUNFRC 0.23* 0.50 0.50 0.70  CREATININE 3.14* 3.39* 3.40* 3.07*    Estimated Creatinine Clearance: 26.1 mL/min (A) (by C-G formula based on SCr of 3.07 mg/dL (H)).   Medical History: Past Medical History:  Diagnosis Date  . AICD (automatic cardioverter/defibrillator) present   . Ankle injury    RIGHT  . Arthritis   . BPH (benign prostatic hypertrophy)   . Cancer ([place])   . Coronary artery disease   . Diabetes mellitus without complication ([place])    TYPE 2  . Dysrhythmia   . Glaucoma   . H/O: knee surgery    2 PINS IN RIGHT KNEE AND IN RIGHT ANKLE  . Hyperlipidemia   . Hypertension   . Mass of lung    RIGHT UPPER LOBE  . Myocardial infarction ([place])   . 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 Shortness of breath    WITH EXERTION     Assessment: 61yom Hx CAD s/p CABG 2002, ICM with ICD admitted s/p VF arrest (s/p TTM) per pacemaker interrogation.Troponin bump to 70  - continuing heparin for  concern of apical thrombus  -heparin level is at goal on 1200 units/hr  Goal of Therapy:  Heparin level 0.3-0.7 units/ml Monitor platelets by anticoagulation protocol: Yes   Plan:  -No heparin changes needed -Daily heparin level and CBC   Hildred Laser, Pharm D 09/11/2017 8:23 AM

## 2017-09-11 NOTE — Progress Notes (Signed)
ekg strip completed for qtc >.50. Dr Chase Caller notified. EKG ordered for confirmation. Will speak with cardiology regarding precedex gtt being started.

## 2017-09-11 NOTE — Progress Notes (Signed)
QTc very prolonged  dw Dr  Ellyn Hack  - he wil dc amio - no precedex for 09/11/2017 -> threfore ccm will not extubate 09/11/2017   Dr. Brand Males, M.D., [facility].C.P Pulmonary and Critical Care Medicine Staff Physician, [place] Director - Interstitial Lung Disease  Program  Pulmonary [place] at [place], [place], [address]  Pager: [phone], If no answer or between  15:00h - 7:00h: call [phone] Telephone: [phone]

## 2017-09-12 ENCOUNTER — Inpatient Hospital Stay (HOSPITAL_COMMUNITY): Payer: Medicare Other

## 2017-09-12 DIAGNOSIS — I25709 Atherosclerosis of coronary artery bypass graft(s), unspecified, with unspecified angina pectoris: Secondary | ICD-10-CM

## 2017-09-12 DIAGNOSIS — Z4502 Encounter for adjustment and management of automatic implantable cardiac defibrillator: Secondary | ICD-10-CM

## 2017-09-12 LAB — GLUCOSE, CAPILLARY
GLUCOSE-CAPILLARY: 213 mg/dL — AB (ref 65–99)
GLUCOSE-CAPILLARY: 183 mg/dL — AB (ref 65–99)
GLUCOSE-CAPILLARY: 171 mg/dL — AB (ref 65–99)
Glucose-Capillary: 201 mg/dL — ABNORMAL HIGH (ref 65–99)
Glucose-Capillary: 209 mg/dL — ABNORMAL HIGH (ref 65–99)
Glucose-Capillary: 213 mg/dL — ABNORMAL HIGH (ref 65–99)

## 2017-09-12 LAB — CBC WITH DIFFERENTIAL/PLATELET
Basophils Absolute: 0 10*3/uL (ref 0.0–0.1)
Basophils Relative: 0 %
EOS ABS: 0.1 10*3/uL (ref 0.0–0.7)
Eosinophils Relative: 1 %
HCT: 44.3 % (ref 39.0–52.0)
Hemoglobin: 14.8 g/dL (ref 13.0–17.0)
LYMPHS ABS: 1.6 10*3/uL (ref 0.7–4.0)
Lymphocytes Relative: 13 %
MCH: 27 pg (ref 26.0–34.0)
MCHC: 33.4 g/dL (ref 30.0–36.0)
MCV: 80.8 fL (ref 78.0–100.0)
MONO ABS: 1.1 10*3/uL — AB (ref 0.1–1.0)
Monocytes Relative: 9 %
NEUTROS ABS: 9.7 10*3/uL — AB (ref 1.7–7.7)
Neutrophils Relative %: 77 %
PLATELETS: 138 10*3/uL — AB (ref 150–400)
RBC: 5.48 MIL/uL (ref 4.22–5.81)
RDW: 17.9 % — AB (ref 11.5–15.5)
WBC: 12.5 10*3/uL — ABNORMAL HIGH (ref 4.0–10.5)

## 2017-09-12 LAB — CULTURE, BLOOD (ROUTINE X 2)
CULTURE: NO GROWTH
Culture: NO GROWTH
Special Requests: ADEQUATE
Special Requests: ADEQUATE

## 2017-09-12 LAB — HEPARIN LEVEL (UNFRACTIONATED)
HEPARIN UNFRACTIONATED: 0.97 [IU]/mL — AB (ref 0.30–0.70)
HEPARIN UNFRACTIONATED: 0.87 [IU]/mL — AB (ref 0.30–0.70)
Heparin Unfractionated: 0.69 IU/mL (ref 0.30–0.70)

## 2017-09-12 LAB — BASIC METABOLIC PANEL
Anion gap: 16 — ABNORMAL HIGH (ref 5–15)
BUN: 133 mg/dL — ABNORMAL HIGH (ref 6–20)
CALCIUM: 8.5 mg/dL — AB (ref 8.9–10.3)
CO2: 30 mmol/L (ref 22–32)
CREATININE: 2.89 mg/dL — AB (ref 0.61–1.24)
Chloride: 102 mmol/L (ref 101–111)
GFR calc Af Amer: 25 mL/min — ABNORMAL LOW (ref >60)
GFR, EST NON AFRICAN AMERICAN: 22 mL/min — AB (ref >60)
GLUCOSE: 216 mg/dL — AB (ref 65–99)
Potassium: 3.4 mmol/L — ABNORMAL LOW (ref 3.5–5.1)
Sodium: 148 mmol/L — ABNORMAL HIGH (ref 135–145)

## 2017-09-12 LAB — PROCALCITONIN: PROCALCITONIN: 5.73 ng/mL

## 2017-09-12 LAB — PHOSPHORUS: Phosphorus: 4 mg/dL (ref 2.5–4.6)

## 2017-09-12 LAB — MAGNESIUM: MAGNESIUM: 2.1 mg/dL (ref 1.7–2.4)

## 2017-09-12 MED ORDER — FENTANYL CITRATE (PF) 100 MCG/2ML IJ SOLN
100.0000 ug | INTRAMUSCULAR | Status: DC | PRN
  Administered 2017-09-13 – 2017-09-15 (×2): 100 ug via INTRAVENOUS
  Filled 2017-09-12 (×3): qty 2

## 2017-09-12 MED ORDER — FENTANYL CITRATE (PF) 100 MCG/2ML IJ SOLN: 25.0000 ug | INTRAMUSCULAR | Status: DC | PRN

## 2017-09-12 MED ORDER — ACETAMINOPHEN 160 MG/5ML PO SOLN
650.0000 mg | ORAL | Status: DC | PRN
  Administered 2017-09-12 – 2017-09-21 (×4): 650 mg
  Filled 2017-09-12 (×4): qty 20.3

## 2017-09-12 MED ORDER — FUROSEMIDE 10 MG/ML IJ SOLN
40.0000 mg | Freq: Every day | INTRAMUSCULAR | Status: DC
  Administered 2017-09-12 – 2017-09-13 (×2): 40 mg via INTRAVENOUS
  Filled 2017-09-12 (×2): qty 4

## 2017-09-12 MED ORDER — POTASSIUM CHLORIDE 20 MEQ/15ML (10%) PO SOLN
40.0000 meq | Freq: Once | ORAL | Status: AC
  Administered 2017-09-12: 40 meq
  Filled 2017-09-12: qty 30

## 2017-09-12 MED ORDER — MIDAZOLAM HCL 2 MG/2ML IJ SOLN
2.0000 mg | INTRAMUSCULAR | Status: AC | PRN
  Administered 2017-09-12 – 2017-09-13 (×3): 2 mg via INTRAVENOUS
  Filled 2017-09-12 (×3): qty 2

## 2017-09-12 MED ORDER — MIDAZOLAM HCL 2 MG/2ML IJ SOLN
2.0000 mg | INTRAMUSCULAR | Status: DC | PRN
  Administered 2017-09-13 – 2017-09-21 (×21): 2 mg via INTRAVENOUS
  Administered 2017-09-23: 1 mg via INTRAVENOUS
  Administered 2017-09-23: 2 mg via INTRAVENOUS
  Filled 2017-09-12 (×26): qty 2

## 2017-09-12 MED ORDER — FENTANYL CITRATE (PF) 100 MCG/2ML IJ SOLN
100.0000 ug | INTRAMUSCULAR | Status: AC | PRN
  Administered 2017-09-12 – 2017-09-13 (×3): 100 ug via INTRAVENOUS
  Filled 2017-09-12 (×3): qty 2

## 2017-09-12 NOTE — Progress Notes (Signed)
PULMONARY / CRITICAL CARE MEDICINE   Name: Mark Martin MRN: [id number] DOB: [birth year]    ADMISSION DATE:  09/05/2017   REFERRING MD: Emergency department physician  CHIEF COMPLAINT: Cardiac arrest  BRIEF  Mark Martin is a 62 year old male with known ischemic cardiomyopathy, ejection fraction less than 20%, with an AICD in place.  Was in his usual state of health until 09/05/2017 when he had witnessed arrest.  Family started CPR EMS arrived and ROSC within 25 minutes with 1 dose of epinephrine.  His AICD was interrogated and showed 25 minutes of VT in PEA.  His AICD did shock him x1. He is transferred to [facility] emergency department where he was intubated and started on epinephrine drip.  He continued to have multiple episodes of PEA requiring increasing doses of epinephrine. PCCM called for admission and hypothermia protocol.     STUDIES:  09/05/2017 2D echo >>>EF 15%, possible early forming thrombus, severe diffuse hypokinesis, grade 2 diastolic dysfunction, PA peak pressure 45mmHg 09/05/2017 CT of the head >>>neg acute  1/15 EEG>>> diffuse slowing; non specific etiology patter. No epileptiform discharge.  CULTURES: Blood 1/12>>>NTD Procal 7.14 Sputum 1/12>>> normal flora   ANTIBIOTICS: Rocephin 1/12>>>  LINES/TUBES: 09/05/2017 ETT 09/05/2017 R fem CVL>>>  09/05/2017 right femoral aline>>>   SIGNIFICANT EVENTS: 09/05/2017 VT PEA arrest 09/08/17 - Pressor demand decreasing, no events overnight 09/09/17 - worsening AKI. Family at beside. On vent. Daughter at bedside and wife report less responsiveness x 48h/ Last prn sedation of fent/versed on 09/06/17 - per RN moves all 4s, non purposeful and weaning on SBT. Off pressors. Has femoral lines 1/16 - No acute change overnight.  Tolerating pressure support wean 5/5.  Looks good from respiratory standpoint.  More awake but not following commands.   SUBJECTIVE/OVERNIGHT/INTERVAL HX 1/17 - qtc still > 527msec . STill on amio. Creat  better; 2.8, BUN 133 (lasix backed off yesterday). NEw fever 101F. WBC improving. PSV yet to start  VITAL SIGNS: BP 92/63   Pulse 68   Temp 99.3 F (37.4 C)   Resp 18   Ht 5\' 6"  (1.676 m)   Wt 84 kg (185 lb 3 oz)   SpO2 98%   BMI 29.89 kg/m   HEMODYNAMICS:    VENTILATOR SETTINGS: Vent Mode: PRVC FiO2 (%):  [30 %] 30 % Set Rate:  [18 bmp] 18 bmp Vt Set:  [510 mL] 510 mL PEEP:  [5 cmH20] 5 cmH20 Pressure Support:  [5 cmH20] 5 cmH20 Plateau Pressure:  [18 cmH20-20 cmH20] 20 cmH20  INTAKE / OUTPUT: I/O last 3 completed shifts: In: 2235.6 [I.V.:300; Other:700; NG/GT:1135.6; IV Piggyback:100] Out: 2130 [Urine:6575]  PHYSICAL EXAMINATION:  General Appearance:    Looks better but on vent  Head:    Normocephalic, without obvious abnormality, atraumatic  Eyes:    PERRL - yes, conjunctiva/corneas - yes      Ears:    Normal external ear canals, both ears  Nose:   NG tube - no  Throat:  ETT TUBE - yes , OG tube - yes  Neck:   Supple,  No enlargement/tenderness/nodules     Lungs:     Clear to auscultation bilaterally, Ventilator   Synchrony - yes  Chest wall:    No deformity  Heart:    S1 and S2 normal, no murmur, CVP - no.  Pressors - no  Abdomen:     Soft, no masses, no organomegaly  Genitalia:    Not done  Rectal:   not done  Extremities:  Extremities- intact     Skin:   Intact in exposed areas .     Neurologic:   Sedation - prn -> RASS - -2 . Moves all 4s - yes. CAM-ICU - very rarely will follow command but mostly moves all 4s, stares to voice and can fix. Couigh +      PULMONARY Recent Labs  Lab 09/05/17 1656 09/05/17 2337 09/06/17 0508 09/07/17 0357 09/08/17 0345 09/08/17 0512 09/09/17 0506  PHART 7.212* 7.234* 7.301* 7.447 7.435 7.412 7.436  PCO2ART 30.8* 31.2* 28.5* 25.7* 29.1* 29.5* 34.1  PO2ART 90.0 144.0* 179* 134.0* 116* 99.0 125.0*  HCO3 12.8* 13.4* 13.6* 17.8* 19.2* 18.8* 22.9  TCO2 14* 14*  --  19*  --  20* 24  O2SAT 96.0 99.0 98.9 99.0 97.8  98.0 99.0    CBC Recent Labs  Lab 09/10/17 0212 09/11/17 0245 09/12/17 0250  HGB 14.4 14.8 14.8  HCT 42.0 43.4 44.3  WBC 17.2* 15.2* 12.5*  PLT 125* 127* 138*    COAGULATION Recent Labs  Lab 09/05/17 1018 09/05/17 1803  INR 1.34 1.38    CARDIAC   Recent Labs  Lab 09/05/17 1018 09/05/17 1244 09/05/17 1803 09/05/17 2322 09/06/17 0437  TROPONINI 2.65* 9.73* 22.82* 49.82* 70.34*   No results for input(s): PROBNP in the last 168 hours.   CHEMISTRY Recent Labs  Lab 09/08/17 0201 09/09/17 0416 09/09/17 1833 09/10/17 0212 09/11/17 0245 09/12/17 0250  NA 134* 137 137 139 143 148*  K 3.9 3.8 3.3* 3.0* 3.1* 3.4*  CL 99* 101 99* 99* 98* 102  CO2 18* 22 23 25 28 30   GLUCOSE 211* 186* 135* 168* 225* 216*  BUN 78* 104* 117* 123* 127* 133*  CREATININE 2.89* 3.14* 3.39* 3.40* 3.07* 2.89*  CALCIUM 7.8* 7.8* 7.9* 7.9* 8.5* 8.5*  MG 2.2 2.2  --  1.9 2.3 2.1  PHOS 6.9* 5.1*  --  5.1* 4.8* 4.0   Estimated Creatinine Clearance: 26.6 mL/min (A) (by C-G formula based on SCr of 2.89 mg/dL (H)).   LIVER Recent Labs  Lab 09/05/17 1018 09/05/17 1803  AST 76*  --   ALT 25  --   ALKPHOS 121  --   BILITOT 1.3*  --   PROT 5.5*  --   ALBUMIN 2.8*  --   INR 1.34 1.38     INFECTIOUS Recent Labs  Lab 09/05/17 1027 09/07/17 0924 09/08/17 0201 09/09/17 0416  LATICACIDVEN 5.78*  --   --   --   PROCALCITON  --  7.14 6.27 5.66     ENDOCRINE CBG (last 3)  Recent Labs    09/11/17 2350 09/12/17 0345 09/12/17 0724  GLUCAP 209* 213* 183*         IMAGING x48h  - image(s) personally visualized  -   highlighted in bold Dg Abd Portable 1v  Result Date: 09/11/2017 CLINICAL DATA:  Orogastric tube placement. EXAM: PORTABLE ABDOMEN - 1 VIEW COMPARISON:  CT 07/23/2015 FINDINGS: Nasogastric terminates at the distal stomach. Incompletely imaged pacer. Nonobstructive bowel-gas pattern. Vascular calcifications. 1.4 cm calcification is likely a gallstone when correlated  with prior CT. IMPRESSION: Nasogastric terminating at the distal stomach. Cholelithiasis. Electronically Signed   By: Mark Martin M.D.   On: 09/11/2017 10:49        ASSESSMENT / PLAN:  PULMONARY A: Vent dependent respiratory failure secondary to PEA cardiac arrest History of left adenocarcinoma with resection in 2014  - 09/12/2017 - ready for sBT. Did SBT for acute resp failure many  hours yesterday. Was not extubated due to residual but improving encephalopathy and restlessness and some no gag/cough. Today has cough/gag despite sedation and might be best bet to extubate  P:   SBT and if does well trial extubation esp he is now at risk for VAP Vent support - 8cc/kg  Titrate O2 for sat of 88-92%  CARDIOVASCULAR A:  Status post VT PEA arrest Ischemic cardiomyopathy - EF 15%, AICD in place  Acute on chronic combined systolic and diastolic heart failure  ST elevation MI Cardiogenic shock   - QTc > 540msec but ? V Paced  P:  DC amio (per discussion with DR Ellyn Hack yhesterday) Cardiology following  Diona Fanti, brilinta, statin , Heparin gtt - cards managing Reduce diuresis Holding ARB with AKI  Holding B blocker r/t hypotension    RENAL AKI-in setting of cardiac arrest, hypotension.     09/12/2017 - Has gotten pre-renal with lasix and this improving with creat improving now that lasix is being back off.  Making good urine.    P:   Reduce lasix to 40mg  daily Follow renal functions Replete electrolytes as needed KVO IVF   GASTROINTESTINAL A:   No active issue  P:   PPI TF per nutrition  HEMATOLOGIC Anticoagulation per cardiology P:  Per cardiology Heparin for DVT prophylaxis   INFECTIOUS Recent Labs  Lab 09/08/17 0201 09/09/17 0416 09/10/17 0212 09/11/17 0245 09/12/17 0250  WBC 24.1* 23.4* 17.2* 15.2* 12.5*   Recent Labs  Lab 09/07/17 0924 09/08/17 0201 09/09/17 0416  PROCALCITON 7.14 6.27 5.66    A:   Empiric ceftriaxone but has had break through  fever 09/12/2017 but wbc bbetter  P: Pan culture   Recheck procalcitonin and if improving dc antibiotic if meets threshold but if worsening change abx Anti-infectives (From admission, onward)   Start     Dose/Rate Route Frequency Ordered Stop   09/07/17 1600  cefTRIAXone (ROCEPHIN) 1 g in dextrose 5 % 50 mL IVPB     1 g 100 mL/hr over 30 Minutes Intravenous Every 24 hours 09/07/17 1327         ENDOCRINE A:   Hyperglycemia P:   Sliding scale insulin  NEUROLOGIC A:   Post PEA arrest does not follow commands. Head CT 1/14 and EEG 1/14 - non specific  09/12/2017  - slowly imprivng encephalopathy P:   RASS goal: 0 Sedation as needed   FAMILY  - Updates: No family at bedside  09/12/2017 .   - Inter-disciplinary family meet or Palliative Care meeting due by:  day 7   GLOBAL  - aim to extubate 09/12/2017 ; might be best chance    The patient is critically ill with multiple organ systems failure and requires high complexity decision making for assessment and support, frequent evaluation and titration of therapies, application of advanced monitoring technologies and extensive interpretation of multiple databases.   Critical Care Time devoted to patient care services described in this note is  30  Minutes. This time reflects time of care of this signee Dr Brand Males. This critical care time does not reflect procedure time, or teaching time or supervisory time of PA/NP/Med student/Med Resident etc but could involve care discussion time    Dr. Brand Males, M.D., [facility].C.P Pulmonary and Critical Care Medicine Staff Physician [place] [facility] Pager: [phone], If no answer or between  15:00h - 7:00h: call [phone]  09/12/2017 8:25 AM

## 2017-09-12 NOTE — Progress Notes (Signed)
eLink Physician-Brief Progress Note Patient Name: Mark Martin DOB: 1955-09-03 MRN: [id number]   Date of Service  09/12/2017  HPI/Events of Note  Isses with ongoing agitation now off all sedation/pain medications in the hopes of extubating the patient is AM.  Camera check shows patient moving in bed.  He is restrained.  He is breathing over the vent is tachycardiac and mildly hypertensive.  QTc greater than 500 makes using precedex questionable.  eICU Interventions  Plan: Will attempt to use PRN fentanyl/versed for sedation RASS goal 0 to -1 Continue to monitor patient via [facility]     Intervention Category Major Interventions: Delirium, psychosis, severe agitation - evaluation and management  DETERDING,ELIZABETH 09/12/2017, 8:03 PM

## 2017-09-12 NOTE — Progress Notes (Signed)
I spoke with the family tonight and it was related that they would like a repeat CT head and a neurologist to speak with them in the morning of 09/13/2017.  They have a concern for neurologic function s/p cardiac arrest. The son, Gilliam Hawkes, may be reached at [phone].  Albertine Grates states that he has been given little information r/t the prognosis of his father.

## 2017-09-12 NOTE — Progress Notes (Signed)
ANTICOAGULATION CONSULT NOTE - Follow Up Consult  Pharmacy Consult for Heparin Indication: chest pain/ACS  No Known Allergies  Patient Measurements: Height: 5\' 6"  (167.6 cm) Weight: 185 lb 3 oz (84 kg) IBW/kg (Calculated) : 63.8  Vital Signs: Temp: 101.3 F (38.5 C) (01/17 0400) Temp Source: Core (01/17 0400) BP: 99/71 (01/17 0400) Pulse Rate: 70 (01/17 0400)  Labs: Recent Labs    09/09/17 1833  09/10/17 0212 09/11/17 0245 09/12/17 0250  HGB  --    < > 14.4 14.8 14.8  HCT  --   --  42.0 43.4 44.3  PLT  --   --  125* 127* 138*  HEPARINUNFRC 0.50  --  0.50 0.70 0.97*  CREATININE 3.39*  --  3.40* 3.07*  --    < > = values in this interval not displayed.    Estimated Creatinine Clearance: 25 mL/min (A) (by C-G formula based on SCr of 3.07 mg/dL (H)).   Medical History: Past Medical History:  Diagnosis Date  . AICD (automatic cardioverter/defibrillator) present   . Ankle injury    RIGHT  . Arthritis   . BPH (benign prostatic hypertrophy)   . Cancer ([place])   . Coronary artery disease   . Diabetes mellitus without complication ([place])    TYPE 2  . Dysrhythmia   . Glaucoma   . H/O: knee surgery    2 PINS IN RIGHT KNEE AND IN RIGHT ANKLE  . Hyperlipidemia   . Hypertension   . Mass of lung    RIGHT UPPER LOBE  . Myocardial infarction ([place])   . 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 Shortness of breath    WITH EXERTION    Assessment: 61yom Hx CAD s/p CABG 2002, ICM with ICD admitted s/p VF arrest (s/p TTM) per pacemaker interrogation.Troponin bump to 70  - continuing heparin for  concern of apical thrombus   1/17 AM: heparin level elevated, has been trending up over the past few days, no issues per RN.   Goal of Therapy:  Heparin level 0.3-0.7 units/ml Monitor platelets by anticoagulation protocol: Yes   Plan:  -Dec heparin to 1000 units/hr -[place], PharmD, [place] Pharmacist Phone: [phone]

## 2017-09-12 NOTE — Progress Notes (Signed)
    Previous saw the patient today.    Diuretics have been cut back appropriately.  Thankfully his renal function seems to slowly be improving. No longer on amiodarone because of concerns for need for Precedex.  Beta-blocker being held because of hypotension, however we need to monitor for signs of tachycardia.  If tachycardia increases it may be due to agitation, however it appears to be atrial fibrillation or another arrhythmia, would probably go back to amiodarone by mouth/tube.  Concern at this point is that he is neurologic function does not seem to be responding.  Currently not a candidate for any invasive cardiac evaluation based on neurologic status and renal function.  Ultimately, would probably be okay to stop heparin and restart oral amiodarone.  As his blood pressure increases/improves, we can start to consider increasing medications for ischemic cardiomyopathy.  No new cardiology recommendations today.  We will follow along and be available if changes are needed.   Glenetta Hew, MD

## 2017-09-12 NOTE — Progress Notes (Signed)
Patient weaned 6 1/2 hours successfully. Per MD patient placed back on full support for rest. Willl attempt SBT again tomorrow with hopes of extubation.

## 2017-09-12 NOTE — Progress Notes (Signed)
ANTICOAGULATION CONSULT NOTE - Follow Up Consult  Pharmacy Consult for Heparin Indication: chest pain/ACS  No Known Allergies  Patient Measurements: Height: 5\' 6"  (167.6 cm) Weight: 185 lb 3 oz (84 kg) IBW/kg (Calculated) : 63.8  Vital Signs: Temp: 98.4 F (36.9 C) (01/17 1958) Temp Source: Oral (01/17 1958) BP: 130/93 (01/17 2200) Pulse Rate: 104 (01/17 2305)  Labs: Recent Labs    09/10/17 0212 09/11/17 0245 09/12/17 0250 09/12/17 1410 09/12/17 2250  HGB 14.4 14.8 14.8  --   --   HCT 42.0 43.4 44.3  --   --   PLT 125* 127* 138*  --   --   HEPARINUNFRC 0.50 0.70 0.97* 0.87* 0.69  CREATININE 3.40* 3.07* 2.89*  --   --     Estimated Creatinine Clearance: 26.6 mL/min (A) (by C-G formula based on SCr of 2.89 mg/dL (H)).   Medical History: Past Medical History:  Diagnosis Date  . AICD (automatic cardioverter/defibrillator) present   . Ankle injury    RIGHT  . Arthritis   . BPH (benign prostatic hypertrophy)   . Cancer ([place])   . Coronary artery disease   . Diabetes mellitus without complication ([place])    TYPE 2  . Dysrhythmia   . Glaucoma   . H/O: knee surgery    2 PINS IN RIGHT KNEE AND IN RIGHT ANKLE  . Hyperlipidemia   . Hypertension   . Mass of lung    RIGHT UPPER LOBE  . Myocardial infarction ([place])   . 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 Shortness of breath    WITH EXERTION    Assessment: 61yom Hx CAD s/p CABG 2002, ICM with ICD admitted s/p VF arrest (s/p TTM) per pacemaker interrogation.Troponin bump to 70  - continuing heparin for  concern of apical thrombus   1/17 AM: heparin level therapeutic x 1 after rate decrease  Goal of Therapy:  Heparin level 0.3-0.7 units/ml Monitor platelets by anticoagulation protocol: Yes   Plan:  -Cont heparin at 800 units/hr -Confirmatory heparin level with AM labs  Narda Bonds, PharmD, [place]  Pharmacist Phone: [phone]

## 2017-09-12 NOTE — Progress Notes (Signed)
Talked to Dr. Chase Caller. He ordered for patient not to be extubated d/t patient not meeting parameters. Tube feeding is to be restarted.  Will continue to monitor patient. No new complaints at this time.

## 2017-09-12 NOTE — Progress Notes (Signed)
ANTICOAGULATION CONSULT NOTE - Follow Up Consult  Pharmacy Consult for Heparin Indication: chest pain/ACS  No Known Allergies  Patient Measurements: Height: 5\' 6"  (167.6 cm) Weight: 185 lb 3 oz (84 kg) IBW/kg (Calculated) : 63.8  Vital Signs: Temp: 96.9 F (36.1 C) (01/17 1418) Temp Source: Axillary (01/17 1418) BP: 114/79 (01/17 1500) Pulse Rate: 77 (01/17 1500)  Labs: Recent Labs    09/10/17 0212 09/11/17 0245 09/12/17 0250 09/12/17 1410  HGB 14.4 14.8 14.8  --   HCT 42.0 43.4 44.3  --   PLT 125* 127* 138*  --   HEPARINUNFRC 0.50 0.70 0.97* 0.87*  CREATININE 3.40* 3.07* 2.89*  --     Estimated Creatinine Clearance: 26.6 mL/min (A) (by C-G formula based on SCr of 2.89 mg/dL (H)).   Assessment: 61yom Hx CAD s/p CABG 2002, ICM with ICD admitted s/p VF arrest (s/p TTM) per pacemaker interrogation.Troponin bump to 70  - continuing heparin for  concern of apical thrombus   Heparin level = 0.87  Goal of Therapy:  Heparin level 0.3-0.7 units/ml Monitor platelets by anticoagulation protocol: Yes   Plan:  -Dec heparin to 800 units/hr -2300 HL  Thank you Anette Guarneri, PharmD [phone]

## 2017-09-13 ENCOUNTER — Inpatient Hospital Stay (HOSPITAL_COMMUNITY): Payer: Medicare Other

## 2017-09-13 DIAGNOSIS — I481 Persistent atrial fibrillation: Secondary | ICD-10-CM

## 2017-09-13 DIAGNOSIS — J9600 Acute respiratory failure, unspecified whether with hypoxia or hypercapnia: Secondary | ICD-10-CM

## 2017-09-13 LAB — GLUCOSE, CAPILLARY
GLUCOSE-CAPILLARY: 182 mg/dL — AB (ref 65–99)
GLUCOSE-CAPILLARY: 210 mg/dL — AB (ref 65–99)
GLUCOSE-CAPILLARY: 241 mg/dL — AB (ref 65–99)
GLUCOSE-CAPILLARY: 285 mg/dL — AB (ref 65–99)
Glucose-Capillary: 239 mg/dL — ABNORMAL HIGH (ref 65–99)
Glucose-Capillary: 242 mg/dL — ABNORMAL HIGH (ref 65–99)
Glucose-Capillary: 187 mg/dL — ABNORMAL HIGH (ref 65–99)

## 2017-09-13 LAB — URINE CULTURE
Culture: NO GROWTH
Special Requests: NORMAL

## 2017-09-13 LAB — HEPATIC FUNCTION PANEL
ALT: 161 U/L — AB (ref 17–63)
AST: 67 U/L — AB (ref 15–41)
Albumin: 2.3 g/dL — ABNORMAL LOW (ref 3.5–5.0)
Alkaline Phosphatase: 392 U/L — ABNORMAL HIGH (ref 38–126)
BILIRUBIN DIRECT: 0.6 mg/dL — AB (ref 0.1–0.5)
BILIRUBIN TOTAL: 1.6 mg/dL — AB (ref 0.3–1.2)
Indirect Bilirubin: 1 mg/dL — ABNORMAL HIGH (ref 0.3–0.9)
Total Protein: 5.9 g/dL — ABNORMAL LOW (ref 6.5–8.1)

## 2017-09-13 LAB — BASIC METABOLIC PANEL
ANION GAP: 18 — AB (ref 5–15)
BUN: 135 mg/dL — ABNORMAL HIGH (ref 6–20)
CO2: 28 mmol/L (ref 22–32)
Calcium: 9 mg/dL (ref 8.9–10.3)
Chloride: 108 mmol/L (ref 101–111)
Creatinine, Ser: 2.92 mg/dL — ABNORMAL HIGH (ref 0.61–1.24)
GFR calc Af Amer: 25 mL/min — ABNORMAL LOW (ref >60)
GFR calc non Af Amer: 21 mL/min — ABNORMAL LOW (ref >60)
GLUCOSE: 280 mg/dL — AB (ref 65–99)
POTASSIUM: 3.6 mmol/L (ref 3.5–5.1)
Sodium: 154 mmol/L — ABNORMAL HIGH (ref 135–145)

## 2017-09-13 LAB — CBC
HEMATOCRIT: 45.4 % (ref 39.0–52.0)
Hemoglobin: 14.8 g/dL (ref 13.0–17.0)
MCH: 27 pg (ref 26.0–34.0)
MCHC: 32.6 g/dL (ref 30.0–36.0)
MCV: 82.7 fL (ref 78.0–100.0)
Platelets: 156 10*3/uL (ref 150–400)
RBC: 5.49 MIL/uL (ref 4.22–5.81)
RDW: 18.2 % — ABNORMAL HIGH (ref 11.5–15.5)
WBC: 14 10*3/uL — AB (ref 4.0–10.5)

## 2017-09-13 LAB — PROCALCITONIN: Procalcitonin: 5.71 ng/mL

## 2017-09-13 LAB — PHOSPHORUS: Phosphorus: 4 mg/dL (ref 2.5–4.6)

## 2017-09-13 LAB — MAGNESIUM: Magnesium: 2.1 mg/dL (ref 1.7–2.4)

## 2017-09-13 LAB — HEPARIN LEVEL (UNFRACTIONATED): Heparin Unfractionated: 0.6 IU/mL (ref 0.30–0.70)

## 2017-09-13 MED ORDER — DEXMEDETOMIDINE HCL IN NACL 400 MCG/100ML IV SOLN
0.4000 ug/kg/h | INTRAVENOUS | Status: DC
  Administered 2017-09-13: 0.4 ug/kg/h via INTRAVENOUS
  Administered 2017-09-14: 0.2 ug/kg/h via INTRAVENOUS
  Filled 2017-09-13 (×2): qty 100

## 2017-09-13 MED ORDER — SODIUM CHLORIDE 0.9% FLUSH: 10.0000 mL | INTRAVENOUS | Status: DC | PRN

## 2017-09-13 MED ORDER — VANCOMYCIN HCL IN DEXTROSE 1-5 GM/200ML-% IV SOLN
1000.0000 mg | INTRAVENOUS | Status: DC
  Administered 2017-09-14: 1000 mg via INTRAVENOUS
  Filled 2017-09-13 (×2): qty 200

## 2017-09-13 MED ORDER — SODIUM CHLORIDE 0.9% FLUSH
10.0000 mL | Freq: Two times a day (BID) | INTRAVENOUS | Status: DC
  Administered 2017-09-14 – 2017-09-16 (×2): 10 mL

## 2017-09-13 MED ORDER — PIPERACILLIN-TAZOBACTAM 3.375 G IVPB
3.3750 g | Freq: Three times a day (TID) | INTRAVENOUS | Status: AC
  Administered 2017-09-13 – 2017-09-19 (×20): 3.375 g via INTRAVENOUS
  Filled 2017-09-13 (×20): qty 50

## 2017-09-13 MED ORDER — CHLORHEXIDINE GLUCONATE CLOTH 2 % EX PADS
6.0000 | MEDICATED_PAD | Freq: Every day | CUTANEOUS | Status: DC
  Administered 2017-09-13 – 2017-09-23 (×11): 6 via TOPICAL

## 2017-09-13 MED ORDER — WHITE PETROLATUM EX OINT
TOPICAL_OINTMENT | CUTANEOUS | Status: AC
  Administered 2017-09-13: 19:00:00
  Filled 2017-09-13: qty 28.35

## 2017-09-13 MED ORDER — VANCOMYCIN HCL 10 G IV SOLR
2000.0000 mg | Freq: Once | INTRAVENOUS | Status: AC
  Administered 2017-09-13: 2000 mg via INTRAVENOUS
  Filled 2017-09-13: qty 2000

## 2017-09-13 NOTE — Progress Notes (Signed)
Pharmacy Antibiotic Note  Mark Martin is a 62 y.o. male admitted on 09/05/2017 post cardiac arrest.   Patient continues to be febrile to 102.9, wbc stable at 12.5, pct 5.7. Patient received ceftriaxone from 1/12 > 1/18. New orders received to start broad antibiotics with vancomycin and zosyn for possible sepsis.    Plan: Zosyn 3.375g IV q8 hours - infuse over 4 hours Vancomycin 2g IV x1 then 1g q24 hours  Height: 5\' 6"  (167.6 cm) Weight: 182 lb 1.6 oz (82.6 kg) IBW/kg (Calculated) : 63.8  Temp (24hrs), Avg:98.9 F (37.2 C), Min:96.4 F (35.8 C), Max:101.3 F (38.5 C)  Recent Labs  Lab 09/08/17 0201 09/09/17 0416 09/09/17 1833 09/10/17 0212 09/11/17 0245 09/12/17 0250  WBC 24.1* 23.4*  --  17.2* 15.2* 12.5*  CREATININE 2.89* 3.14* 3.39* 3.40* 3.07* 2.89*    Estimated Creatinine Clearance: 26.4 mL/min (A) (by C-G formula based on SCr of 2.89 mg/dL (H)).    No Known Allergies  1/12 rocephin >1/17 Vanc 1/18>> Zosyn 1/18>>  1/12 blood x2- neg 1/12 resp- normal flora 1/17 blood x2-ngtd 1/17 resp - ngtd Thank you for allowing pharmacy to be a part of this patient's care.  Erin Hearing PharmD., BCPS Clinical Pharmacist 09/13/2017 10:55 AM

## 2017-09-13 NOTE — Progress Notes (Signed)
PULMONARY / CRITICAL CARE MEDICINE   Name: Mark Martin MRN: [id number] DOB: [birth year]    ADMISSION DATE:  09/05/2017   REFERRING MD: Emergency department physician  CHIEF COMPLAINT: Cardiac arrest  BRIEF  Mark Martin is a 62 year old male with known ischemic cardiomyopathy, ejection fraction less than 20%, with an AICD in place.  Was in his usual state of health until 09/05/2017 when he had witnessed arrest.  Family started CPR EMS arrived and ROSC within 25 minutes with 1 dose of epinephrine.  His AICD was interrogated and showed 25 minutes of VT in PEA.  His AICD did shock him x1. He is transferred to [facility] emergency department where he was intubated and started on epinephrine drip.  He continued to have multiple episodes of PEA requiring increasing doses of epinephrine. PCCM called for admission and hypothermia protocol.     STUDIES:  09/05/2017 2D echo >>>EF 15%, possible early forming thrombus, severe diffuse hypokinesis, grade 2 diastolic dysfunction, PA peak pressure 71mmHg 09/05/2017 CT of the head >>>neg acute  1/15 EEG>>> diffuse slowing; non specific etiology patter. No epileptiform discharge.  CULTURES: Blood 1/12>>>NTD Procal 7.14 Sputum 1/12>>> normal flora  Blood ANTIBIOTICS: Rocephin 1/12>>>  LINES/TUBES: 09/05/2017 ETT 09/05/2017 R fem CVL>>>  09/05/2017 right femoral aline>>>   SIGNIFICANT EVENTS: 09/05/2017 VT PEA arrest 09/08/17 - Pressor demand decreasing, no events overnight 09/09/17 - worsening AKI. Family at beside. On vent. Daughter at bedside and wife report less responsiveness x 48h/ Last prn sedation of fent/versed on 09/06/17 - per RN moves all 4s, non purposeful and weaning on SBT. Off pressors. Has femoral lines 1/16 - No acute change overnight.  Tolerating pressure support wean 5/5.  Looks good from respiratory standpoint.  More awake but not following commands. 1/18- Weaning this am on 30%, 5/5, pulling good volumes with prn fentanyl and  versed. MAE x 4, but does not follow commands   SUBJECTIVE/OVERNIGHT/INTERVAL HX 1/18 - qtc still > 513msec . Amio off.  T max  102.9 F. On CPAP/PS with good volumes. Off all sedation with exception of prn versed and fentanyl.  VITAL SIGNS: BP 122/81   Pulse (!) 107   Temp 99.2 F (37.3 C) (Oral)   Resp (!) 23   Ht 5\' 6"  (1.676 m)   Wt 182 lb 1.6 oz (82.6 kg)   SpO2 95%   BMI 29.39 kg/m   HEMODYNAMICS:    VENTILATOR SETTINGS: Vent Mode: PSV;CPAP FiO2 (%):  [30 %] 30 % Set Rate:  [18 bmp] 18 bmp Vt Set:  [510 mL] 510 mL PEEP:  [5 cmH20] 5 cmH20 Pressure Support:  [5 cmH20-10 cmH20] 5 cmH20 Plateau Pressure:  [20 PFX90-24 cmH20] 21 cmH20  INTAKE / OUTPUT: I/O last 3 completed shifts: In: 1359.9 [I.V.:353.4; Other:20; NG/GT:986.4] Out: 0973 [Urine:3850; Stool:4]  PHYSICAL EXAMINATION:  General Appearance:    Weaning on 5/10, agitated, does not follow commands  Head:    Normocephalic, without obvious abnormality, atraumatic, ETT. OG tube  Eyes:    PERRL - yes, conjunctiva/corneas - yes      Ears:    Normal external ear canals, both ears  Nose:   NG tube - no  Throat:  ETT TUBE - yes , OG tube - yes  Neck:   Supple,  No enlargement/tenderness/nodules     Lungs:     Weaning BS Clear to auscultation bilaterally, Ventilator   Synchrony - yes  Chest wall:    No deformity  Heart:    S1 and S2  normal, no murmur, CVP - no.  Pressors - no  Abdomen:     Soft, no masses, no organomegaly  Genitalia:    Condom cath  Rectal:   excoriated skin  Extremities:   Extremities- intact, warm and dry     Skin:   Multiple skin tears, excoriated peri and rectal areas .     Neurologic:   Sedation - prn -> RASS - -1 to -2 . Moves all 4s - yes. CAM-ICU - no  Following of  command but mostly moves all 4s, stares to voice and can fix, no tracking. + cough      PULMONARY Recent Labs  Lab 09/07/17 0357 09/08/17 0345 09/08/17 0512 09/09/17 0506  PHART 7.447 7.435 7.412 7.436  PCO2ART  25.7* 29.1* 29.5* 34.1  PO2ART 134.0* 116* 99.0 125.0*  HCO3 17.8* 19.2* 18.8* 22.9  TCO2 19*  --  20* 24  O2SAT 99.0 97.8 98.0 99.0    CBC Recent Labs  Lab 09/10/17 0212 09/11/17 0245 09/12/17 0250  HGB 14.4 14.8 14.8  HCT 42.0 43.4 44.3  WBC 17.2* 15.2* 12.5*  PLT 125* 127* 138*    COAGULATION No results for input(s): INR in the last 168 hours.  CARDIAC   No results for input(s): TROPONINI in the last 168 hours. No results for input(s): PROBNP in the last 168 hours.   CHEMISTRY Recent Labs  Lab 09/09/17 0416 09/09/17 1833 09/10/17 0212 09/11/17 0245 09/12/17 0250 09/13/17 0418  NA 137 137 139 143 148*  --   K 3.8 3.3* 3.0* 3.1* 3.4*  --   CL 101 99* 99* 98* 102  --   CO2 22 23 25 28 30   --   GLUCOSE 186* 135* 168* 225* 216*  --   BUN 104* 117* 123* 127* 133*  --   CREATININE 3.14* 3.39* 3.40* 3.07* 2.89*  --   CALCIUM 7.8* 7.9* 7.9* 8.5* 8.5*  --   MG 2.2  --  1.9 2.3 2.1 2.1  PHOS 5.1*  --  5.1* 4.8* 4.0 4.0   Estimated Creatinine Clearance: 26.4 mL/min (A) (by C-G formula based on SCr of 2.89 mg/dL (H)).   LIVER No results for input(s): AST, ALT, ALKPHOS, BILITOT, PROT, ALBUMIN, INR in the last 168 hours.   INFECTIOUS Recent Labs  Lab 09/09/17 0416 09/12/17 1021 09/13/17 0418  PROCALCITON 5.66 5.73 5.71     ENDOCRINE CBG (last 3)  Recent Labs    09/13/17 0013 09/13/17 0358 09/13/17 0735  GLUCAP 285* 239* 210*         IMAGING x48h  - image(s) personally visualized  -   highlighted in bold Dg Chest Port 1 View  Result Date: 09/13/2017 CLINICAL DATA:  Hypoxia EXAM: PORTABLE CHEST 1 VIEW COMPARISON:  September 12, 2017 FINDINGS: Endotracheal tube tip is 5.5 cm above the carina. Nasogastric tube tip and side port are below the diaphragm. Pacemaker leads are attached to the right atrium, right ventricle, and coronary sinus. There is airspace consolidation in the right upper lobe and left base regions, essentially stable. There is  cardiomegaly. The pulmonary vascularity is normal. Patient is status post coronary artery bypass grafting. No adenopathy. No evident bone lesions. IMPRESSION: Tube positions as described without pneumothorax. Areas of airspace consolidation in the right upper lobe and left base, likely multifocal pneumonia. No new opacity evident. Stable cardiac prominence. No change in cardiac silhouette. Electronically Signed   By: Lowella Grip III M.D.   On: 09/13/2017 07:31   Dg  Chest Portable 1 View  Result Date: 09/12/2017 CLINICAL DATA:  Followup ventilator support. EXAM: PORTABLE CHEST 1 VIEW COMPARISON:  09/09/2017 FINDINGS: Endotracheal tube tip is 5 cm above the carina. Nasogastric tube enters the abdomen. Pacemaker/AICD appears the same. There is slight worsening of patchy lung density in the mid lungs and lower lobes which could be a combination of atelectasis, edema and pneumonia. No pronounced change. There is a small amount of pleural fluid on the left. IMPRESSION: Slight worsening of patchy bilateral lung density, most pronounced in the left lower lobe. Findings could be secondary to atelectasis, edema and/or pneumonia. Small amount of pleural fluid on the left. Electronically Signed   By: Nelson Chimes M.D.   On: 09/12/2017 09:26   Dg Abd Portable 1v  Result Date: 09/11/2017 CLINICAL DATA:  Orogastric tube placement. EXAM: PORTABLE ABDOMEN - 1 VIEW COMPARISON:  CT 07/23/2015 FINDINGS: Nasogastric terminates at the distal stomach. Incompletely imaged pacer. Nonobstructive bowel-gas pattern. Vascular calcifications. 1.4 cm calcification is likely a gallstone when correlated with prior CT. IMPRESSION: Nasogastric terminating at the distal stomach. Cholelithiasis. Electronically Signed   By: Abigail Miyamoto M.D.   On: 09/11/2017 10:49        ASSESSMENT / PLAN:  PULMONARY A: Vent dependent respiratory failure secondary to PEA cardiac arrest History of left adenocarcinoma with resection in 2014 CXR  1/18>> with Areas of airspace consolidation in the right upper lobe and left base, likely multifocal pneumonia.  Stable cardiac prominence.   - 09/13/2017 - Tolerating SBT with good volumes Not following commands, 30% 5/5 T max 102.9  Procalcitonin 5.71 P:   Continue CPAP/PS with goal of extubation Minimize sedation as able  Vent support - 8cc/kg  Titrate O2 for sat of 88-92%  CARDIOVASCULAR A:  Status post VT PEA arrest Ischemic cardiomyopathy - EF 15%, AICD in place  Acute on chronic combined systolic and diastolic heart failure  ST elevation MI Cardiogenic shock Amio off since 1/17 Net - 300 last 24 and - 1.6 L since admission  - QTc remains > 560msec but ? V Paced  P:  Amio off Cardiology following  Asa, brilinta, statin , Heparin gtt - cards managing Reduce diuresis Holding ARB with AKI  Holding B blocker r/t hypotension  MAP goal of 65 Monitor urine output    RENAL AKI-in setting of cardiac arrest, hypotension. Hypokalemia >> repleted 1/17    09/13/2017 - .  Making good urine.No labs this am    P:   BMET now Reduce lasix to 40 mg daily Follow renal functions Maintain renal perfusion Avoid nephrotoxic medications Replete electrolytes as needed KVO IVF   GASTROINTESTINAL A:   Distended , Firm abdomen Diminished but present  BS  Last BM 1/18, loose TF appearance P:  KUB now  PPI TF per nutrition If does not extubate soon, will need Cortrak feeding tube  HEMATOLOGIC Anticoagulation per cardiology for concern of apical thrombus P:  Heparin gtt Per cardiology/ pharmacy Heparin Level goal 0.3-0.7 units per hour. Heparin for DVT prophylaxis   INFECTIOUS Recent Labs  Lab 09/08/17 0201 09/09/17 0416 09/10/17 0212 09/11/17 0245 09/12/17 0250  WBC 24.1* 23.4* 17.2* 15.2* 12.5*   Recent Labs  Lab 09/07/17 0924 09/08/17 0201 09/09/17 0416 09/12/17 1021 09/13/17 0418  PROCALCITON 7.14 6.27 5.66 5.73 5.71    A:   Empiric ceftriaxone but  has had break through fever 09/13/2017 to 102.9 No CBC 1/18 am Procalcitonin 5.71 P: CBC now Pan cultured 1/17 Trend procalcitonin to  guide ABX therapy Trend fever curve and WBC Follow pan cultures done 1/17  Anti-infectives (From admission, onward)   Start     Dose/Rate Route Frequency Ordered Stop   09/07/17 1600  cefTRIAXone (ROCEPHIN) 1 g in dextrose 5 % 50 mL IVPB     1 g 100 mL/hr over 30 Minutes Intravenous Every 24 hours 09/07/17 1327         ENDOCRINE A:   Hyperglycemia P:  CBG Q 4  Sliding scale insulin  NEUROLOGIC A:   Post PEA arrest does not follow commands. Head CT 1/14 and EEG 1/14 - non specific  09/13/2017  - Does not follow commands, restless P:   RASS goal: -1 Minimize sedation as able   FAMILY  - Updates: No family at bedside  09/13/2017 .   - Inter-disciplinary family meet or Palliative Care meeting due by:  day 7   GLOBAL  - would like to  extubate 09/13/2017 ; but continued fever and inability to follow commands may limit options for extubation    Magdalen Spatz, AGACNP-BC [place] Pager:  [phone]  09/13/2017 8:44 AM

## 2017-09-13 NOTE — Progress Notes (Signed)
eLink Physician-Brief Progress Note Patient Name: Mark Martin DOB: June 28, 1956 MRN: [id number]   Date of Service  09/13/2017  HPI/Events of Note  Int agitation  eICU Interventions  Try precedex gtt - if BP tolerates Cannot interpret qTc withpaced rhythm     Intervention Category Major Interventions: Delirium, psychosis, severe agitation - evaluation and management  Noelia Lenart V. Whitt Auletta 09/13/2017, 7:31 PM

## 2017-09-13 NOTE — Progress Notes (Signed)
Progress Note  Patient Name: Mark Martin Date of Encounter: 09/13/2017  Primary Cardiologist: Otho Perl [facility])  Subjective   Unfortunately, he is still minimally responsive.  Apparently follows 1 out of 10 commands.  But nothing for me.  Seems to have on purposeful gaze.  The nurse feels as though he really is not tracking.  Seems to develop what appears to be hospital-acquired pneumonia and is now on antibiotics.  His A. fib is now starting to get a little faster.  There is concern of tense abdomen and he is having that evaluated.   Inpatient Medications    Scheduled Meds: . white petrolatum      . aspirin  81 mg Per Tube Daily  . atorvastatin  40 mg Per Tube q1800  . chlorhexidine gluconate (MEDLINE KIT)  15 mL Mouth Rinse BID  . Chlorhexidine Gluconate Cloth  6 each Topical Daily  . feeding supplement (PRO-STAT SUGAR FREE 64)  60 mL Per Tube BID  . feeding supplement (VITAL HIGH PROTEIN)  1,000 mL Per Tube Q24H  . furosemide  40 mg Intravenous Daily  . insulin aspart  0-9 Units Subcutaneous Q4H  . mouth rinse  15 mL Mouth Rinse 10 times per day  . pantoprazole (PROTONIX) IV  40 mg Intravenous QHS  . sodium chloride flush  10-40 mL Intracatheter Q12H  . ticagrelor  90 mg Per Tube BID   Continuous Infusions: . heparin Stopped (09/13/17 1300)  . piperacillin-tazobactam (ZOSYN)  IV Stopped (09/13/17 1645)  . [START ON 09/14/2017] vancomycin     PRN Meds: acetaminophen (TYLENOL) oral liquid 160 mg/5 mL, fentaNYL (SUBLIMAZE) injection, Influenza vac split quadrivalent PF, midazolam, sodium chloride flush   Vital Signs    Vitals:   09/13/17 1508 09/13/17 1600 09/13/17 1700 09/13/17 1730  BP: 109/75 102/74 (!) 85/63 91/69  Pulse: 99 94 86 87  Resp: (!) 25 (!) '21 18 18  '$ Temp:  99.8 F (37.7 C)    TempSrc:  Oral    SpO2: 96% 97% 97% 98%  Weight:      Height:        Intake/Output Summary (Last 24 hours) at 09/13/2017 1802 Last data filed at 09/13/2017 1700 Gross  per 24 hour  Intake 1373.93 ml  Output 2304 ml  Net -930.07 ml   Filed Weights   09/11/17 0412 09/12/17 0425 09/13/17 0454  Weight: 201 lb 15.1 oz (91.6 kg) 185 lb 3 oz (84 kg) 182 lb 1.6 oz (82.6 kg)    Physical Exam   General appearance: Intubated and partially sedated.  Does not respond to me.  Does withdraw to pain sometimes. Neck: no carotid bruit and no JVD Lungs: Diffuse coarse rhonchus throughout. Heart: Tachycardic somewhat regular but irregular rhythm.  S4 gallop noted.  Frequent ectopy noted.  Harsh SEM at RUSB.  Radiates to neck. Abdomen: Mildly distended, somewhat tense and tympanitic. Extremities: Minimal peripheral edema.  In fact he looks to be somewhat dry. Pulses: Diminished bilateral pedal pulses but palpable. Neurologic: Mental status: minimally responsive.  does not seem to have purposeful movement.   Labs    Chemistry Recent Labs  Lab 09/11/17 0245 09/12/17 0250 09/13/17 1100  NA 143 148* 154*  K 3.1* 3.4* 3.6  CL 98* 102 108  CO2 '28 30 28  '$ GLUCOSE 225* 216* 280*  BUN 127* 133* 135*  CREATININE 3.07* 2.89* 2.92*  CALCIUM 8.5* 8.5* 9.0  PROT  --   --  5.9*  ALBUMIN  --   --  2.3*  AST  --   --  67*  ALT  --   --  161*  ALKPHOS  --   --  392*  BILITOT  --   --  1.6*  GFRNONAA 20* 22* 21*  GFRAA 23* 25* 25*  ANIONGAP 17* 16* 18*     Hematology Recent Labs  Lab 09/11/17 0245 09/12/17 0250 09/13/17 1100  WBC 15.2* 12.5* 14.0*  RBC 5.44 5.48 5.49  HGB 14.8 14.8 14.8  HCT 43.4 44.3 45.4  MCV 79.8 80.8 82.7  MCH 27.2 27.0 27.0  MCHC 34.1 33.4 32.6  RDW 17.6* 17.9* 18.2*  PLT 127* 138* 156    Cardiac Enzymes No results for input(s): TROPONINI in the last 168 hours.  No results for input(s): TROPIPOC in the last 168 hours.   BNP No results for input(s): BNP, PROBNP in the last 168 hours.   DDimer No results for input(s): DDIMER in the last 168 hours.   Radiology    US Abdomen Complete  Result Date: 09/13/2017 CLINICAL DATA:   Calcified gallstone by prior imaging. EXAM: ABDOMEN ULTRASOUND COMPLETE COMPARISON:  None. FINDINGS: Gallbladder: Shadowing calculus present near the neck of the gallbladder measuring roughly 15 mm in greatest diameter. No associated gallbladder inflammation or distention. Common bile duct: Diameter: 3.5 mm Liver: No focal lesion identified. Within normal limits in parenchymal echogenicity. Portal vein is patent on color Doppler imaging with normal direction of blood flow towards the liver. IVC: No abnormality visualized. Pancreas: Visualized portion unremarkable. Spleen: Size and appearance within normal limits. Right Kidney: Length: 11 cm. The collecting system is mildly dilated. Left Kidney: Length: 11.5 cm. There is mild to moderate left hydronephrosis. Abdominal aorta: No aneurysm visualized. Other findings: No ascites visualized. IMPRESSION: 1. Single shadowing gallstone near the neck of the gallbladder measuring roughly 15 mm in greatest diameter. No associated gallbladder distention or inflammation. No evidence of biliary obstruction. 2. Evidence of bilateral hydronephrosis, left greater than right. Electronically Signed   By: Aletta Edouard M.D.   On: 09/13/2017 16:14   Dg Chest Port 1 View  Result Date: 09/13/2017 CLINICAL DATA:  Hypoxia EXAM: PORTABLE CHEST 1 VIEW COMPARISON:  September 12, 2017 FINDINGS: Endotracheal tube tip is 5.5 cm above the carina. Nasogastric tube tip and side port are below the diaphragm. Pacemaker leads are attached to the right atrium, right ventricle, and coronary sinus. There is airspace consolidation in the right upper lobe and left base regions, essentially stable. There is cardiomegaly. The pulmonary vascularity is normal. Patient is status post coronary artery bypass grafting. No adenopathy. No evident bone lesions. IMPRESSION: Tube positions as described without pneumothorax. Areas of airspace consolidation in the right upper lobe and left base, likely multifocal  pneumonia. No new opacity evident. Stable cardiac prominence. No change in cardiac silhouette. Electronically Signed   By: Lowella Grip III M.D.   On: 09/13/2017 07:31   Dg Chest Portable 1 View  Result Date: 09/12/2017 CLINICAL DATA:  Followup ventilator support. EXAM: PORTABLE CHEST 1 VIEW COMPARISON:  09/09/2017 FINDINGS: Endotracheal tube tip is 5 cm above the carina. Nasogastric tube enters the abdomen. Pacemaker/AICD appears the same. There is slight worsening of patchy lung density in the mid lungs and lower lobes which could be a combination of atelectasis, edema and pneumonia. No pronounced change. There is a small amount of pleural fluid on the left. IMPRESSION: Slight worsening of patchy bilateral lung density, most pronounced in the left lower lobe. Findings could be secondary to  atelectasis, edema and/or pneumonia. Small amount of pleural fluid on the left. Electronically Signed   By: Nelson Chimes M.D.   On: 09/12/2017 09:26   Dg Abd Portable 1v  Result Date: 09/13/2017 CLINICAL DATA:  Orogastric tube placement EXAM: PORTABLE ABDOMEN - 1 VIEW COMPARISON:  September 11, 2017 FINDINGS: Orogastric tube tip and side port are in the distal stomach. There is moderate stool in the colon. There is no bowel dilatation or air-fluid level to suggest bowel obstruction. No free air. There is a laminated gallstone in the right upper quadrant. There is a small left pleural effusion. IMPRESSION: Orogastric tube tip and side port in distal stomach. No bowel obstruction or free air. Laminated gallstone right upper quadrant. Electronically Signed   By: Lowella Grip III M.D.   On: 09/13/2017 10:14    Telemetry    Paced rhythm - background rhythm appears to be more rapid Afib. - Personally Reviewed  ECG    1/17 - AS Vpaced - NSR- Personally Reviewed  Cardiac Studies    2D Echo 09/06/17: Dilated left ventricle with swirling flow in the LV apex but no obvious thrombus.  Severely reduced LV  function with EF of 15%.  Diffuse hypokinesis throughout with segmental differences.  GRII DD.  Mild AI with possibly mild AS.  Mod Pulm HTN Peak PA pressures 50 mmHg.  Patient Profile     62 y.o. male with CABG x5( '02) and DES to mid Cx 2016, ICM, HTN, HL, DM, Lung mass s/p VATS who presented with out of hospital cardiac arrest.  Assessment & Plan    1. Cardiac Arrest with VT>>VF with ICD shock: Unfortunately do not know his baseline EF, but current EF is 50%.  He has existing Environmental health practitioner AICD in place.  Atrial fibrillation, persistent  At this point, with him not being on Precedex, I would restart amiodarone especially because of atrial fibrillation underlying atrial fibrillation.  Would stop IV heparin if there is concern for bleeding.  He did have bloody suctioning from his ET tube.   Reason for heparin is for atrial fibrillation, but if there is concern for bleeding, would stop.   2. CAD s/p CABG: Clearly suspect that this is possibly an ischemic event given the troponin elevation to greater than 70 --would probably call this a non-STEMI (although physiologically is more like a STEMI) --> most likely event was probably an occluded vein graft which may or may not leave any revascularization options.  On ASA, Brilinta,statin --would stop Brilinta, and changed to Plavix.  Again, he has been fully treated for possible ischemic event with IV heparin.  At this time I would stop the heparin to exclude any bleeding issues.  No plans for now to pursue ischemic evaluation in the setting of acute renal insufficiency and delayed recovery of neurologic status. -->  Depending on what his renal function does may or may not consider ischemic evaluation during his hospitalization.  3. Ischemic cardiomyopathy s/p St. Jude PPM/ICD -- with resolved cardiogenic shock following cardiac arrest: -Known ischemic cardiomyopathy with St. Jude. Echo this admission with EF 15%. - In setting of cardiac arrest and  encouraging shock was in Class III-IV acute on chronic combined systolic and diastolic heart failure.   Has been aggressively diuresed --chest x-ray still looks quite bad, but may very well be related to ARDS/pneumonia.  Agree with low-dose Lasix, but not aggressive anymore.  Unfortunately, his blood pressure is not yet stabilized enough to be a consider beta-blocker  or ARB.  In fact ARB  is contraindicated with acute renal insufficiency.)  4. AKI : Likely related to combination of shock and overdiuresis.  He seems to have plateaued with his current renal function.  Would avoid aggressive diuresis at this time  5. Respiratory Failure: Per [facility] M -Remains intubated, does not meet extubation criteria in the setting of ARF r/t acute encephalopathy  Anticipate that as he is becoming more awake and alert, he will be moving toward weaning trials.   Unfortunately, he remains minimally responsive.  I think this plans for tracheostomy tomorrow.  I stopped heparin to allow for the procedure to be done, and because the main reason is for atrial fibrillation in the background.  At this time I think if there is concern for bleeding being off heparin is probably better.  Concerned about anoxic brain injury and failure to recover renal function and neurologic function makes him prohibitive risk for any potential invasive cardiac evaluation anytime soon.  Summary of Recommendation:  DC IV heparin for now  DC Brilinta, changed to Plavix tomorrow -or when safe post tracheostomy.  Loaded with 300 mg and then sent 5 mg daily.  Restart oral/per tube amiodarone to prevent further arrhythmias.  No plans for any invasive cardiac evaluation until he has recovery of neurologic function and renal function.  We will monitor over the weekend and be available for assistance if necessary, but will likely round from a distance.   Signed, Glenetta Hew, M.D., M.S. Interventional Cardiologist   Pager #  [phone] Phone # [phone] [address] [place], [place] [address]  09/13/2017, 6:02 PM   For questions or updates, please contact   Please consult [email or web address] for contact info under Cardiology/STEMI.

## 2017-09-13 NOTE — Progress Notes (Signed)
Nutrition Follow-up  INTERVENTION:   As able resume Vital High Protein @ 35 ml/hr (840 ml/day) 60 ml Prostat BID  Provides: 1240 kcal, 133 grams protein, and 702 ml free water.    NUTRITION DIAGNOSIS:   Inadequate oral intake related to inability to eat as evidenced by NPO status. Ongoing.   GOAL:   Provide needs based on ASPEN/SCCM guidelines Met.   MONITOR:   Vent status, Labs, Weight trends, TF tolerance, Skin, I & O's  ASSESSMENT:   Pt with PMH of HTN, DM, ischemic cardiomyopathy, ejection fraction less than 20%, with an AICD in place admitted after PEA cardiac arrest.   Per MD will stay intubated, pt agitated and not following commands.  TF held this am due to abd more firm Weight down 13 lb, 2.2 L negative  Patient is currently intubated on ventilator support MV: 12.1 L/min Temp (24hrs), Avg:98.4 F (36.9 C), Min:96.4 F (35.8 C), Max:99.6 F (37.6 C)  Medications reviewed and include: lasix, novolog Labs reviewed: Na 154 (H), Alk phos 392 (H) CBG's: 210-242     Diet Order:  No diet orders on file  EDUCATION NEEDS:   No education needs have been identified at this time  Skin:  Skin Assessment: (open wound arm)  Last BM:  1/18  Height:   Ht Readings from Last 1 Encounters:  09/05/17 '5\' 6"'$  (1.676 m)    Weight:   Wt Readings from Last 1 Encounters:  09/13/17 182 lb 1.6 oz (82.6 kg)    Ideal Body Weight:  64.5 kg  BMI:  Body mass index is 29.39 kg/m.  Estimated Nutritional Needs:   Kcal:  4259-5638   Protein:  >129 grams  Fluid:  > 1.5 L/day   Maylon Peppers RD, LDN, CNSC [phone] Pager [phone] After Hours Pager

## 2017-09-13 NOTE — Progress Notes (Signed)
Peripherally Inserted Central Catheter/Midline Placement  The IV Nurse has discussed with the patient and/or persons authorized to consent for the patient, the purpose of this procedure and the potential benefits and risks involved with this procedure.  The benefits include less needle sticks, lab draws from the catheter, and the patient may be discharged home with the catheter. Risks include, but not limited to, infection, bleeding, blood clot (thrombus formation), and puncture of an artery; nerve damage and irregular heartbeat and possibility to perform a PICC exchange if needed/ordered by physician.  Alternatives to this procedure were also discussed.  Bard Power PICC patient education guide, fact sheet on infection prevention and patient information card has been provided to patient /or left at bedside.    PICC/Midline Placement Documentation  PICC Double Lumen 84/21/03 PICC Right Basilic 42 cm (Active)       Jule Economy Horton 09/13/2017, 5:08 PM

## 2017-09-13 NOTE — Progress Notes (Signed)
ANTICOAGULATION CONSULT NOTE - Follow Up Consult  Pharmacy Consult for Heparin Indication: chest pain/ACS  No Known Allergies  Patient Measurements: Height: 5\' 6"  (167.6 cm) Weight: 182 lb 1.6 oz (82.6 kg) IBW/kg (Calculated) : 63.8  Vital Signs: Temp: 99.2 F (37.3 C) (01/18 0733) Temp Source: Oral (01/18 0733) BP: 107/82 (01/18 0900) Pulse Rate: 105 (01/18 0900)  Labs: Recent Labs    09/11/17 0245 09/12/17 0250 09/12/17 1410 09/12/17 2250 09/13/17 0418  HGB 14.8 14.8  --   --   --   HCT 43.4 44.3  --   --   --   PLT 127* 138*  --   --   --   HEPARINUNFRC 0.70 0.97* 0.87* 0.69 0.60  CREATININE 3.07* 2.89*  --   --   --     Estimated Creatinine Clearance: 26.4 mL/min (A) (by C-G formula based on SCr of 2.89 mg/dL (H)).   Medical History: Past Medical History:  Diagnosis Date  . AICD (automatic cardioverter/defibrillator) present   . Ankle injury    RIGHT  . Arthritis   . BPH (benign prostatic hypertrophy)   . Cancer ([place])   . Coronary artery disease   . Diabetes mellitus without complication ([place])    TYPE 2  . Dysrhythmia   . Glaucoma   . H/O: knee surgery    2 PINS IN RIGHT KNEE AND IN RIGHT ANKLE  . Hyperlipidemia   . Hypertension   . Mass of lung    RIGHT UPPER LOBE  . Myocardial infarction ([place])   . 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 Shortness of breath    WITH EXERTION    Assessment: 61yom Hx CAD s/p CABG 2002, ICM with ICD admitted s/p VF arrest (s/p TTM) per pacemaker interrogation.Troponin bump to 70  - continuing heparin for  concern of apical thrombus   1/18 AM: heparin level therapeutic, no cbc checked this morning, will order for tomorrow. No bleeding issues noted.  Goal of Therapy:  Heparin level 0.3-0.7 units/ml Monitor platelets by anticoagulation protocol: Yes   Plan:  -Cont heparin at 800 units/hr -Daily heparin  level  Erin Hearing PharmD., BCPS Clinical Pharmacist 09/13/2017 10:11 AM

## 2017-09-13 NOTE — Progress Notes (Signed)
Patient placed back on full support to rest as per MD note not ready to be extubated.

## 2017-09-14 ENCOUNTER — Inpatient Hospital Stay (HOSPITAL_COMMUNITY): Payer: Medicare Other

## 2017-09-14 LAB — CULTURE, RESPIRATORY W GRAM STAIN
Culture: NO GROWTH
Gram Stain: NONE SEEN
Special Requests: NORMAL

## 2017-09-14 LAB — BASIC METABOLIC PANEL
Anion gap: 19 — ABNORMAL HIGH (ref 5–15)
BUN: 143 mg/dL — AB (ref 6–20)
CALCIUM: 8.7 mg/dL — AB (ref 8.9–10.3)
CHLORIDE: 111 mmol/L (ref 101–111)
CO2: 31 mmol/L (ref 22–32)
CREATININE: 3.19 mg/dL — AB (ref 0.61–1.24)
GFR calc Af Amer: 22 mL/min — ABNORMAL LOW (ref >60)
GFR calc non Af Amer: 19 mL/min — ABNORMAL LOW (ref >60)
GLUCOSE: 192 mg/dL — AB (ref 65–99)
Potassium: 3.4 mmol/L — ABNORMAL LOW (ref 3.5–5.1)
Sodium: 161 mmol/L (ref 135–145)

## 2017-09-14 LAB — GLUCOSE, CAPILLARY
GLUCOSE-CAPILLARY: 171 mg/dL — AB (ref 65–99)
GLUCOSE-CAPILLARY: 227 mg/dL — AB (ref 65–99)
GLUCOSE-CAPILLARY: 210 mg/dL — AB (ref 65–99)
GLUCOSE-CAPILLARY: 206 mg/dL — AB (ref 65–99)
Glucose-Capillary: 150 mg/dL — ABNORMAL HIGH (ref 65–99)
Glucose-Capillary: 213 mg/dL — ABNORMAL HIGH (ref 65–99)

## 2017-09-14 LAB — PROCALCITONIN: Procalcitonin: 6.36 ng/mL

## 2017-09-14 LAB — CBC
HCT: 42 % (ref 39.0–52.0)
Hemoglobin: 13.5 g/dL (ref 13.0–17.0)
MCH: 26.9 pg (ref 26.0–34.0)
MCHC: 32.1 g/dL (ref 30.0–36.0)
MCV: 83.7 fL (ref 78.0–100.0)
PLATELETS: 155 10*3/uL (ref 150–400)
RBC: 5.02 MIL/uL (ref 4.22–5.81)
RDW: 18.1 % — ABNORMAL HIGH (ref 11.5–15.5)
WBC: 16.5 10*3/uL — ABNORMAL HIGH (ref 4.0–10.5)

## 2017-09-14 LAB — CULTURE, RESPIRATORY

## 2017-09-14 MED ORDER — POTASSIUM CHLORIDE 10 MEQ/100ML IV SOLN
10.0000 meq | INTRAVENOUS | Status: AC
  Administered 2017-09-14 (×4): 10 meq via INTRAVENOUS
  Filled 2017-09-14 (×4): qty 100

## 2017-09-14 MED ORDER — IOPAMIDOL (ISOVUE-300) INJECTION 61%
INTRAVENOUS | Status: AC
  Filled 2017-09-14: qty 30

## 2017-09-14 MED ORDER — DEXTROSE 5 % IV SOLN
INTRAVENOUS | Status: DC
  Administered 2017-09-14: 100 mL via INTRAVENOUS
  Administered 2017-09-14 – 2017-09-17 (×5): via INTRAVENOUS

## 2017-09-14 MED ORDER — LOPERAMIDE HCL 1 MG/5ML PO LIQD
1.0000 mg | ORAL | Status: DC | PRN
  Administered 2017-09-15 – 2017-09-18 (×3): 1 mg
  Filled 2017-09-14 (×8): qty 5

## 2017-09-14 NOTE — Plan of Care (Signed)
Pt does not follow commands.  Pt remains agitated.  Family still waiting for answers. Lurline Idol if unable to extubate?

## 2017-09-14 NOTE — Progress Notes (Signed)
Patient taken to CT scan and back without issues. Back in room and doing well.

## 2017-09-14 NOTE — Progress Notes (Addendum)
  Remains on vent. No meaningful progress.   Now in NSR.  Suspect poor prognosis.   Unfortunately nothing to add at this point.   We will follow at a distance.   Glori Bickers, MD  10:27 AM   Addendum:  D/w CCM. Patient more responsive throughout day. Trying to wean from vent. Unable to extubated today.   Rhythm remains stable.   We will continue to follow for improvement.  Glori Bickers, MD  4:38 PM

## 2017-09-14 NOTE — Progress Notes (Addendum)
CRITICAL VALUE ALERT  Critical Value:  Na 161  Date & Time Notied:  09/14/2017 0548  Provider Notified: eLink  Orders Received/Actions taken: starting D5W at 142mL/hr per order. Lasix DC.  Understand may need to start Lantus per Dr. Bari Mantis note.

## 2017-09-14 NOTE — Progress Notes (Signed)
eLink Physician-Brief Progress Note Patient Name: Mark Martin DOB: [birth year] MRN: [id number]   Date of Service  09/14/2017  HPI/Events of Note  Severe hypernatremia  eICU Interventions  Dc lasix Start D5W @ 100/h May need to add lantus if CBGs run high     Intervention Category Major Interventions: Electrolyte abnormality - evaluation and management  Nuno Brubacher V. Tyrie Porzio 09/14/2017, 5:50 AM

## 2017-09-14 NOTE — Progress Notes (Signed)
PULMONARY / CRITICAL CARE MEDICINE   Name: Mark Martin MRN: [id number] DOB: 1955/09/24    ADMISSION DATE:  09/05/2017   REFERRING MD: Emergency department physician  CHIEF COMPLAINT: Cardiac arrest  BRIEF  Mark Martin is a 62 year old male with known ischemic cardiomyopathy, ejection fraction less than 20%, with an AICD in place.  Was in his usual state of health until 09/05/2017 when he had witnessed arrest.  Family started CPR EMS arrived and ROSC within 25 minutes with 1 dose of epinephrine.  His AICD was interrogated and showed 25 minutes of VT in PEA.  His AICD did shock him x1. He is transferred to [facility] emergency department where he was intubated and started on epinephrine drip.  He continued to have multiple episodes of PEA requiring increasing doses of epinephrine. PCCM called for admission and hypothermia protocol.     STUDIES:  09/05/2017 2D echo >>>EF 15%, possible early forming thrombus, severe diffuse hypokinesis, grade 2 diastolic dysfunction, PA peak pressure 31mmHg 09/05/2017 CT of the head >>>neg acute  1/15 EEG>>> diffuse slowing; non specific etiology patter. No epileptiform discharge.  CULTURES: Blood 1/12>>>NTD Procal 7.14 Sputum 1/12>>> normal flora  Urine culture 117 2019- 09/12/2017 blood cultures x2>> 09/12/2016 sputum culture>>neg  Blood ANTIBIOTICS: Rocephin 1/12>>>  09/13/2017 Zosyn>> 09/13/2017 vancomycin>>  LINES/TUBES: 09/05/2017 ETT>> 09/05/2017 R fem CVL>>> out 09/05/2017 right femoral aline>>> out Right double-lumen PICC basilic vein 5/39/7673>>   SIGNIFICANT EVENTS: 09/05/2017 VT PEA arrest 09/08/17 - Pressor demand decreasing, no events overnight 09/09/17 - worsening AKI. Family at beside. On vent. Daughter at bedside and wife report less responsiveness x 48h/ Last prn sedation of fent/versed on 09/06/17 - per RN moves all 4s, non purposeful and weaning on SBT. Off pressors. Has femoral lines 1/16 - No acute change overnight.  Tolerating  pressure support wean 5/5.  Looks good from respiratory standpoint.  More awake but not following commands. 1/18- Weaning this am on 30%, 5/5, pulling good volumes with prn fentanyl and versed. MAE x 4, but does not follow commands 09/14/2017 intermittently follow commands.  SUBJECTIVE/OVERNIGHT/INTERVAL HX Intermittently awake and follows commands.  VITAL SIGNS: BP 98/67   Pulse 77   Temp (!) 97.5 F (36.4 C) (Oral)   Resp 18   Ht 5\' 6"  (1.676 m)   Wt 79.8 kg (175 lb 14.8 oz)   SpO2 99%   BMI 28.40 kg/m   HEMODYNAMICS:    VENTILATOR SETTINGS: Vent Mode: PSV;CPAP FiO2 (%):  [30 %] 30 % Set Rate:  [18 bmp] 18 bmp Vt Set:  [510 mL] 510 mL PEEP:  [5 cmH20] 5 cmH20 Pressure Support:  [5 cmH20] 5 cmH20 Plateau Pressure:  [20 ALP37-90 cmH20] 20 cmH20  INTAKE / OUTPUT: I/O last 3 completed shifts: In: 1627.7 [I.V.:172.7; NG/GT:755; IV Piggyback:700] Out: 2829 [Urine:2825; Stool:4]  PHYSICAL EXAMINATION:  General:   HEENT: Endotracheal tube to ventilator, orogastric tube to suction PSY: Dull Neuro: Intermittently follows commands with the right side, awake moving head side-to-side tracks and follows CV: Heart sounds are regular ventricular paced currently PULM: Currently on pressure support of 5 with 5 CPAP with a rate of 16 moving a tidal volume GI: Soft, faint bowel sounds Extremities: warm/dry, 1+ edema  Skin: no rashes or lesions     PULMONARY Recent Labs  Lab 09/08/17 0345 09/08/17 0512 09/09/17 0506  PHART 7.435 7.412 7.436  PCO2ART 29.1* 29.5* 34.1  PO2ART 116* 99.0 125.0*  HCO3 19.2* 18.8* 22.9  TCO2  --  20* 24  O2SAT 97.8  98.0 99.0    CBC Recent Labs  Lab 09/12/17 0250 09/13/17 1100 09/14/17 0457  HGB 14.8 14.8 13.5  HCT 44.3 45.4 42.0  WBC 12.5* 14.0* 16.5*  PLT 138* 156 155    COAGULATION No results for input(s): INR in the last 168 hours.  CARDIAC   No results for input(s): TROPONINI in the last 168 hours. No results for input(s):  PROBNP in the last 168 hours.   CHEMISTRY Recent Labs  Lab 09/09/17 0416  09/10/17 0212 09/11/17 0245 09/12/17 0250 09/13/17 0418 09/13/17 1100 09/14/17 0457  NA 137   < > 139 143 148*  --  154* 161*  K 3.8   < > 3.0* 3.1* 3.4*  --  3.6 3.4*  CL 101   < > 99* 98* 102  --  108 111  CO2 22   < > 25 28 30   --  28 31  GLUCOSE 186*   < > 168* 225* 216*  --  280* 192*  BUN 104*   < > 123* 127* 133*  --  135* 143*  CREATININE 3.14*   < > 3.40* 3.07* 2.89*  --  2.92* 3.19*  CALCIUM 7.8*   < > 7.9* 8.5* 8.5*  --  9.0 8.7*  MG 2.2  --  1.9 2.3 2.1 2.1  --   --   PHOS 5.1*  --  5.1* 4.8* 4.0 4.0  --   --    < > = values in this interval not displayed.   Estimated Creatinine Clearance: 23.5 mL/min (A) (by C-G formula based on SCr of 3.19 mg/dL (H)).   LIVER Recent Labs  Lab 09/13/17 1100  AST 67*  ALT 161*  ALKPHOS 392*  BILITOT 1.6*  PROT 5.9*  ALBUMIN 2.3*     INFECTIOUS Recent Labs  Lab 09/12/17 1021 09/13/17 0418 09/14/17 0457  PROCALCITON 5.73 5.71 6.36     ENDOCRINE CBG (last 3)  Recent Labs    09/13/17 2350 09/14/17 0428 09/14/17 0741  GLUCAP 182* 150* 171*         IMAGING x48h  - image(s) personally visualized  -   highlighted in bold US Abdomen Complete  Result Date: 09/13/2017 CLINICAL DATA:  Calcified gallstone by prior imaging. EXAM: ABDOMEN ULTRASOUND COMPLETE COMPARISON:  None. FINDINGS: Gallbladder: Shadowing calculus present near the neck of the gallbladder measuring roughly 15 mm in greatest diameter. No associated gallbladder inflammation or distention. Common bile duct: Diameter: 3.5 mm Liver: No focal lesion identified. Within normal limits in parenchymal echogenicity. Portal vein is patent on color Doppler imaging with normal direction of blood flow towards the liver. IVC: No abnormality visualized. Pancreas: Visualized portion unremarkable. Spleen: Size and appearance within normal limits. Right Kidney: Length: 11 cm. The collecting  system is mildly dilated. Left Kidney: Length: 11.5 cm. There is mild to moderate left hydronephrosis. Abdominal aorta: No aneurysm visualized. Other findings: No ascites visualized. IMPRESSION: 1. Single shadowing gallstone near the neck of the gallbladder measuring roughly 15 mm in greatest diameter. No associated gallbladder distention or inflammation. No evidence of biliary obstruction. 2. Evidence of bilateral hydronephrosis, left greater than right. Electronically Signed   By: Aletta Edouard M.D.   On: 09/13/2017 16:14   Dg Chest Port 1 View  Result Date: 09/14/2017 CLINICAL DATA:  62 year old male status post cardiac arrest and resuscitation. Respiratory failure. EXAM: PORTABLE CHEST 1 VIEW COMPARISON:  09/13/2017 and earlier. FINDINGS: Portable AP semi upright view at 0413 hours. Stable endotracheal tube tip  at the level the clavicles. Enteric tube courses to the abdomen, tip not included. Right PICC line now in place, tip at the right atrium level, about a centimeter or 2 between the expected cavoatrial junction level. Left chest cardiac AICD. Stable cardiomegaly and mediastinal contours. Prior CABG. Continued confluent left lung base opacity partially obscuring the left hemidiaphragm. Vague but confluent right perihilar opacity has increased since 09/12/2017 but not significantly changed since yesterday. No pneumothorax. Stable mild pulmonary vascular congestion. Possible small left pleural effusion. IMPRESSION: 1. Right PICC line placed, tip projects 1-2 centimeters below the expected level of the cavoatrial junction. 2.  Otherwise stable lines and tubes. 3. Stable multifocal bilateral pulmonary opacity since yesterday suspicious for bilateral pneumonia. Possible small left pleural effusion. 4. Stable pulmonary vascular congestion without overt edema. Electronically Signed   By: Genevie Ann M.D.   On: 09/14/2017 06:55   Dg Chest Port 1 View  Result Date: 09/13/2017 CLINICAL DATA:  Hypoxia EXAM:  PORTABLE CHEST 1 VIEW COMPARISON:  September 12, 2017 FINDINGS: Endotracheal tube tip is 5.5 cm above the carina. Nasogastric tube tip and side port are below the diaphragm. Pacemaker leads are attached to the right atrium, right ventricle, and coronary sinus. There is airspace consolidation in the right upper lobe and left base regions, essentially stable. There is cardiomegaly. The pulmonary vascularity is normal. Patient is status post coronary artery bypass grafting. No adenopathy. No evident bone lesions. IMPRESSION: Tube positions as described without pneumothorax. Areas of airspace consolidation in the right upper lobe and left base, likely multifocal pneumonia. No new opacity evident. Stable cardiac prominence. No change in cardiac silhouette. Electronically Signed   By: Lowella Grip III M.D.   On: 09/13/2017 07:31   Dg Abd Portable 1v  Result Date: 09/13/2017 CLINICAL DATA:  Orogastric tube placement EXAM: PORTABLE ABDOMEN - 1 VIEW COMPARISON:  September 11, 2017 FINDINGS: Orogastric tube tip and side port are in the distal stomach. There is moderate stool in the colon. There is no bowel dilatation or air-fluid level to suggest bowel obstruction. No free air. There is a laminated gallstone in the right upper quadrant. There is a small left pleural effusion. IMPRESSION: Orogastric tube tip and side port in distal stomach. No bowel obstruction or free air. Laminated gallstone right upper quadrant. Electronically Signed   By: Lowella Grip III M.D.   On: 09/13/2017 10:14        ASSESSMENT / PLAN:  PULMONARY A: Vent dependent respiratory failure secondary to PEA cardiac arrest History of left adenocarcinoma with resection in 2014 CXR 1/18>> with Areas of airspace consolidation in the right upper lobe and left base, likely multifocal pneumonia.  Stable cardiac prominence.    - Tolerating SBT with good volumes Follows commands T-max 98.5  P:   Continue CPAP/PS with goal of  extubation mental status will make extubation difficult. Minimize sedation as able  Vent support - 8cc/kg  Titrate O2 for sat of 88-92%  CARDIOVASCULAR A:  Status post VT PEA arrest Ischemic cardiomyopathy - EF 15%, AICD in place  Acute on chronic combined systolic and diastolic heart failure  ST elevation MI Cardiogenic shock Amio off since 1/17   P:  Amio off Cardiology following  Diona Fanti, brilinta, statin , Heparin gtt - cards managing Reduce diuresis Holding ARB with AKI  Holding B blocker r/t hypotension  MAP goal of 65 Monitor urine output    RENAL Lab Results  Component Value Date   CREATININE 3.19 (H) 09/14/2017  CREATININE 2.92 (H) 09/13/2017   CREATININE 2.89 (H) 09/12/2017   Recent Labs  Lab 09/12/17 0250 09/13/17 1100 09/14/17 0457  K 3.4* 3.6 3.4*    AKI-in setting of cardiac arrest, hypotension. Hypokalemia >> repleted 1/1 9      P:    DC Lasix with sodium 161 Follow renal functions Maintain renal perfusion Avoid nephrotoxic medications Replete electrolytes as needed D5W at 100    GASTROINTESTINAL A:   Distended , Firm abdomen Diminished but present  BS  Last BM 1/18, loose TF appearance P:  KUB 118 did not show obstruction Add lomotil 1/19 PPI TF per nutrition If does not extubate soon, will need Cortrak feeding tube  HEMATOLOGIC Anticoagulation per cardiology for concern of apical thrombus P:  Heparin gtt Per cardiology/ pharmacy Heparin Level goal 0.3-0.7 units per hour. Heparin for DVT prophylaxis   INFECTIOUS Recent Labs  Lab 09/10/17 0212 09/11/17 0245 09/12/17 0250 09/13/17 1100 09/14/17 0457  WBC 17.2* 15.2* 12.5* 14.0* 16.5*   Recent Labs  Lab 09/08/17 0201 09/09/17 0416 09/12/17 1021 09/13/17 0418 09/14/17 0457  PROCALCITON 6.27 5.66 5.73 5.71 6.36    A:   Empiric antibiotics P: CBC now Pan cultured 1/17 no positive culture data on 09/14/1998 Pro-calcitonin 6.36 09/14/2017 Trend fever curve and  WBC Follow pan cultures done 1/17 Continue empiric antibiotics for now  Anti-infectives (From admission, onward)   Start     Dose/Rate Route Frequency Ordered Stop   09/14/17 1000  vancomycin (VANCOCIN) IVPB 1000 mg/200 mL premix     1,000 mg 200 mL/hr over 60 Minutes Intravenous Every 24 hours 09/13/17 1055     09/13/17 1200  vancomycin (VANCOCIN) 2,000 mg in sodium chloride 0.9 % 500 mL IVPB     2,000 mg 250 mL/hr over 120 Minutes Intravenous  Once 09/13/17 1055 09/13/17 1542   09/13/17 1200  piperacillin-tazobactam (ZOSYN) IVPB 3.375 g     3.375 g 12.5 mL/hr over 240 Minutes Intravenous Every 8 hours 09/13/17 1055     09/07/17 1600  cefTRIAXone (ROCEPHIN) 1 g in dextrose 5 % 50 mL IVPB  Status:  Discontinued     1 g 100 mL/hr over 30 Minutes Intravenous Every 24 hours 09/07/17 1327 09/13/17 1004       ENDOCRINE CBG (last 3)  Recent Labs    09/13/17 2350 09/14/17 0428 09/14/17 0741  GLUCAP 182* 150* 171*    A:   Hyperglycemia P:  CBG Q 4  Sliding scale insulin Note currently been placed on D5W for sodium 161. He may need more insulin coverage in the near future.  NEUROLOGIC A:   Post PEA arrest does not follow commands. Head CT 1/14 and EEG 1/14 - non specific  09/14/2017 follows commands intermittently side will grip shaking hands and track. P:   RASS goal: -1 Minimize sedation as able   FAMILY  - Updates: No family at bedside 09/14/2017  - Inter-disciplinary family meet or Palliative Care meeting due by:  day 7   GLOBAL: 62 year old with intermittent periods of being awake and following commands.  He meets the criteria for extubation but concern for his underlying mental status is concerning that he may require reintubation.  Will discuss with physicians.    [name] Jaeley Wiker ACNP Maryanna Shape PCCM Pager [phone] till 1 pm If no answer page [phone] 09/14/2017, 11:14 AM

## 2017-09-14 NOTE — Progress Notes (Signed)
First dose of oral contrast given at [place]; second dose of oral contrast given at 2030.

## 2017-09-14 NOTE — Progress Notes (Signed)
Patient has been weaning all day and will not be extubated today per CCM. Pt placed back on full vent support for pending CT trip.

## 2017-09-15 ENCOUNTER — Inpatient Hospital Stay (HOSPITAL_COMMUNITY): Payer: Medicare Other

## 2017-09-15 DIAGNOSIS — N171 Acute kidney failure with acute cortical necrosis: Secondary | ICD-10-CM

## 2017-09-15 LAB — BASIC METABOLIC PANEL
ANION GAP: 14 (ref 5–15)
Anion gap: 13 (ref 5–15)
BUN: 121 mg/dL — AB (ref 6–20)
BUN: 111 mg/dL — AB (ref 6–20)
CALCIUM: 8.2 mg/dL — AB (ref 8.9–10.3)
CHLORIDE: 106 mmol/L (ref 101–111)
CO2: 28 mmol/L (ref 22–32)
CO2: 27 mmol/L (ref 22–32)
Calcium: 8.2 mg/dL — ABNORMAL LOW (ref 8.9–10.3)
Chloride: 105 mmol/L (ref 101–111)
Creatinine, Ser: 3 mg/dL — ABNORMAL HIGH (ref 0.61–1.24)
Creatinine, Ser: 2.9 mg/dL — ABNORMAL HIGH (ref 0.61–1.24)
GFR calc Af Amer: 24 mL/min — ABNORMAL LOW (ref >60)
GFR calc Af Amer: 25 mL/min — ABNORMAL LOW (ref >60)
GFR calc non Af Amer: 21 mL/min — ABNORMAL LOW (ref >60)
GFR, EST NON AFRICAN AMERICAN: 22 mL/min — AB (ref >60)
GLUCOSE: 205 mg/dL — AB (ref 65–99)
Glucose, Bld: 240 mg/dL — ABNORMAL HIGH (ref 65–99)
POTASSIUM: 3.2 mmol/L — AB (ref 3.5–5.1)
Potassium: 3.7 mmol/L (ref 3.5–5.1)
SODIUM: 148 mmol/L — AB (ref 135–145)
Sodium: 145 mmol/L (ref 135–145)

## 2017-09-15 LAB — CBC WITH DIFFERENTIAL/PLATELET
BASOS ABS: 0 10*3/uL (ref 0.0–0.1)
BLASTS: 0 %
Band Neutrophils: 0 %
Basophils Relative: 0 %
Eosinophils Absolute: 0.9 10*3/uL — ABNORMAL HIGH (ref 0.0–0.7)
Eosinophils Relative: 6 %
HCT: 39.8 % (ref 39.0–52.0)
HEMOGLOBIN: 12.7 g/dL — AB (ref 13.0–17.0)
Lymphocytes Relative: 10 %
Lymphs Abs: 1.5 10*3/uL (ref 0.7–4.0)
MCH: 26.7 pg (ref 26.0–34.0)
MCHC: 31.9 g/dL (ref 30.0–36.0)
MCV: 83.6 fL (ref 78.0–100.0)
METAMYELOCYTES PCT: 0 %
MONO ABS: 0.4 10*3/uL (ref 0.1–1.0)
MYELOCYTES: 0 %
Monocytes Relative: 3 %
Neutro Abs: 12 10*3/uL — ABNORMAL HIGH (ref 1.7–7.7)
Neutrophils Relative %: 81 %
Platelets: 128 10*3/uL — ABNORMAL LOW (ref 150–400)
Promyelocytes Absolute: 0 %
RBC: 4.76 MIL/uL (ref 4.22–5.81)
RDW: 18.5 % — ABNORMAL HIGH (ref 11.5–15.5)
WBC: 14.8 10*3/uL — ABNORMAL HIGH (ref 4.0–10.5)
nRBC: 0 /100 WBC

## 2017-09-15 LAB — COOXEMETRY PANEL
CARBOXYHEMOGLOBIN: 1.8 % — AB (ref 0.5–1.5)
METHEMOGLOBIN: 0.6 % (ref 0.0–1.5)
O2 SAT: 58.1 %
TOTAL HEMOGLOBIN: 12.2 g/dL (ref 12.0–16.0)

## 2017-09-15 LAB — GLUCOSE, CAPILLARY
GLUCOSE-CAPILLARY: 195 mg/dL — AB (ref 65–99)
GLUCOSE-CAPILLARY: 223 mg/dL — AB (ref 65–99)
GLUCOSE-CAPILLARY: 211 mg/dL — AB (ref 65–99)
Glucose-Capillary: 195 mg/dL — ABNORMAL HIGH (ref 65–99)
Glucose-Capillary: 186 mg/dL — ABNORMAL HIGH (ref 65–99)

## 2017-09-15 LAB — BLOOD GAS, ARTERIAL
Acid-Base Excess: 4.7 mmol/L — ABNORMAL HIGH (ref 0.0–2.0)
Bicarbonate: 28.6 mmol/L — ABNORMAL HIGH (ref 20.0–28.0)
Delivery systems: POSITIVE
Drawn by: 51702
Expiratory PAP: 5
FIO2: 0.4
INSPIRATORY PAP: 10
O2 SAT: 98 %
PATIENT TEMPERATURE: 98.6
PCO2 ART: 41.5 mmHg (ref 32.0–48.0)
PO2 ART: 111 mmHg — AB (ref 83.0–108.0)
RATE: 10 resp/min
pH, Arterial: 7.453 — ABNORMAL HIGH (ref 7.350–7.450)

## 2017-09-15 LAB — LACTIC ACID, PLASMA: LACTIC ACID, VENOUS: 1.4 mmol/L (ref 0.5–1.9)

## 2017-09-15 LAB — MAGNESIUM
Magnesium: 2.2 mg/dL (ref 1.7–2.4)
Magnesium: 2.1 mg/dL (ref 1.7–2.4)

## 2017-09-15 LAB — PHOSPHORUS: PHOSPHORUS: 5.3 mg/dL — AB (ref 2.5–4.6)

## 2017-09-15 LAB — VANCOMYCIN, TROUGH: Vancomycin Tr: 26 ug/mL (ref 15–20)

## 2017-09-15 MED ORDER — SODIUM CHLORIDE 0.9 % IV SOLN: 1250.0000 mg | INTRAVENOUS | Status: DC

## 2017-09-15 MED ORDER — ORAL CARE MOUTH RINSE
15.0000 mL | Freq: Two times a day (BID) | OROMUCOSAL | Status: DC
  Administered 2017-09-15 – 2017-09-16 (×3): 15 mL via OROMUCOSAL

## 2017-09-15 MED ORDER — POTASSIUM CHLORIDE 10 MEQ/50ML IV SOLN
10.0000 meq | INTRAVENOUS | Status: AC
  Administered 2017-09-15 (×4): 10 meq via INTRAVENOUS
  Filled 2017-09-15 (×4): qty 50

## 2017-09-15 MED ORDER — POTASSIUM CHLORIDE 10 MEQ/100ML IV SOLN
10.0000 meq | INTRAVENOUS | Status: AC
  Administered 2017-09-15 (×4): 10 meq via INTRAVENOUS
  Filled 2017-09-15 (×4): qty 100

## 2017-09-15 MED ORDER — FUROSEMIDE 10 MG/ML IJ SOLN
20.0000 mg | Freq: Once | INTRAMUSCULAR | Status: AC
Start: 2017-09-15 — End: 2017-09-15
  Administered 2017-09-15: 20 mg via INTRAVENOUS

## 2017-09-15 MED ORDER — FUROSEMIDE 10 MG/ML IJ SOLN
80.0000 mg | Freq: Once | INTRAMUSCULAR | Status: AC
  Administered 2017-09-15: 80 mg via INTRAVENOUS
  Filled 2017-09-15: qty 8

## 2017-09-15 MED ORDER — AMIODARONE HCL 200 MG PO TABS: 400.0000 mg | ORAL_TABLET | Freq: Two times a day (BID) | ORAL | Status: DC

## 2017-09-15 MED ORDER — ORAL CARE MOUTH RINSE
15.0000 mL | Freq: Two times a day (BID) | OROMUCOSAL | Status: DC
  Administered 2017-09-16: 15 mL via OROMUCOSAL

## 2017-09-15 MED ORDER — CHLORHEXIDINE GLUCONATE 0.12 % MT SOLN
15.0000 mL | Freq: Two times a day (BID) | OROMUCOSAL | Status: DC
  Administered 2017-09-15 – 2017-09-16 (×2): 15 mL via OROMUCOSAL

## 2017-09-15 MED ORDER — FUROSEMIDE 10 MG/ML IJ SOLN
INTRAMUSCULAR | Status: AC
  Administered 2017-09-15: 20 mg
  Filled 2017-09-15: qty 2

## 2017-09-15 MED ORDER — VANCOMYCIN HCL 10 G IV SOLR
1500.0000 mg | INTRAVENOUS | Status: DC
  Administered 2017-09-15: 1500 mg via INTRAVENOUS
  Filled 2017-09-15: qty 1500

## 2017-09-15 MED ORDER — AMIODARONE HCL IN DEXTROSE 360-4.14 MG/200ML-% IV SOLN
30.0000 mg/h | INTRAVENOUS | Status: DC
  Administered 2017-09-15 – 2017-09-17 (×4): 30 mg/h via INTRAVENOUS
  Filled 2017-09-15 (×4): qty 200

## 2017-09-15 NOTE — Progress Notes (Signed)
K+ 3.2 reported to Dr. Oletta Darter - orders placed.

## 2017-09-15 NOTE — Progress Notes (Signed)
[place] Progress Note Patient Name: Mark Martin DOB: 1956/01/07 MRN: [id number]   Date of Service  09/15/2017  HPI/Events of Note  ABG on BiPAP = 7.45/41.5/111. Sat = 100% and RR = 28.   eICU Interventions  Plan: 1. Continue present management.  2. Will pass ABG results on to ground team.      Intervention Category Intermediate Interventions: Respiratory distress - evaluation and management  Sommer,Steven Eugene 09/15/2017, 10:05 PM

## 2017-09-15 NOTE — Progress Notes (Signed)
RT Note-Placed for mild work of breathing. Dr. Chase Caller aware and will attempt for 2 hours and re assess.

## 2017-09-15 NOTE — Progress Notes (Signed)
SLP Cancellation Note  Patient Details Name: Rion Catala MRN: [id number] DOB: 10-26-1955   Cancelled treatment:       Reason Eval/Treat Not Completed: Medical issues which prohibited therapy. Pt still intubated; may be extubated soon but will f/u next date to allow some time post-extubation before evaluating.   Kern Reap, [place], CCC-SLP 09/15/2017, 3:16 PM  [phone]

## 2017-09-15 NOTE — Progress Notes (Signed)
Dr. Gilford Raid at bedside - verbal order to draw an ABG - RT updated. Will continue to monitor pt.

## 2017-09-15 NOTE — Progress Notes (Signed)
[place] Progress Note Patient Name: Mark Martin DOB: 1956/02/11 MRN: [id number]   Date of Service  09/15/2017  HPI/Events of Note  K+ = 3.2 and Creatinine = 3.0.  eICU Interventions  Will replace K+.     Intervention Category Major Interventions: Electrolyte abnormality - evaluation and management  Nayelie Gionfriddo Eugene 09/15/2017, 5:14 AM

## 2017-09-15 NOTE — Progress Notes (Signed)
RT note- Bipap removed and patient evaluated, NTS performed with RN at the bedside. Patient still has mild work of breathing, poor cough effort, and now is on 4l/min [place]. Dr. Chase Caller called for evaluation for possible re intubation. Currently post assessment, patient will remain on 4l/min [place], continue to watch closely.

## 2017-09-15 NOTE — Progress Notes (Signed)
Progress Note  Patient Name: Mark Martin Date of Encounter: 09/15/2017  Primary Cardiologist: Otho Perl [facility])  Subjective   Remains on vent and will follow intermittent simple commands (Shake hands or close eyes) but nothing more.  SBP in 80s  CVP 10. Creatinine still ~3.0     CXR with ? Right-sided infiltrate   Inpatient Medications    Scheduled Meds: . aspirin  81 mg Per Tube Daily  . atorvastatin  40 mg Per Tube q1800  . chlorhexidine gluconate (MEDLINE KIT)  15 mL Mouth Rinse BID  . Chlorhexidine Gluconate Cloth  6 each Topical Daily  . feeding supplement (PRO-STAT SUGAR FREE 64)  60 mL Per Tube BID  . feeding supplement (VITAL HIGH PROTEIN)  1,000 mL Per Tube Q24H  . furosemide  80 mg Intravenous Once  . insulin aspart  0-9 Units Subcutaneous Q4H  . mouth rinse  15 mL Mouth Rinse 10 times per day  . pantoprazole (PROTONIX) IV  40 mg Intravenous QHS  . sodium chloride flush  10-40 mL Intracatheter Q12H  . ticagrelor  90 mg Per Tube BID   Continuous Infusions: . dexmedetomidine (PRECEDEX) IV infusion Stopped (09/15/17 1001)  . dextrose 50 mL/hr at 09/15/17 1200  . piperacillin-tazobactam (ZOSYN)  IV Stopped (09/15/17 0953)  . potassium chloride 10 mEq (09/15/17 1142)  . vancomycin     PRN Meds: acetaminophen (TYLENOL) oral liquid 160 mg/5 mL, fentaNYL (SUBLIMAZE) injection, Influenza vac split quadrivalent PF, loperamide, midazolam, sodium chloride flush   Vital Signs    Vitals:   09/15/17 1000 09/15/17 1100 09/15/17 1135 09/15/17 1200  BP: (!) 85/63 (!) 83/62  (!) 81/58  Pulse: 61 (!) 59  62  Resp: _0 Temp:  97.6 F (36.4 C)    TempSrc:  Oral    SpO2: 99% 99% 99% 98%  Weight:      Height:        Intake/Output Summary (Last 24 hours) at 09/15/2017 1206 Last data filed at 09/15/2017 1200 Gross per 24 hour  Intake 4001.94 ml  Output 2175 ml  Net 1826.94 ml   Filed Weights   09/13/17 0454 09/14/17 0353 09/15/17 0300  Weight: 82.6 kg (182 lb  1.6 oz) 79.8 kg (175 lb 14.8 oz) 80 kg (176 lb 5.9 oz)    Physical Exam   General: Awake on vent with wide-eyed stare  Follows occasional command HEENT: normal + ETT Neck: supple. JVP 10  Carotids 2+ bilat; no bruits. No lymphadenopathy or thryomegaly appreciated. Cor: PMI laerally displaced. Regular rate & rhythm. No rubs, gallops or murmurs. Lungs: clear Abdomen: soft, nontender, nondistended. No hepatosplenomegaly. No bruits or masses. Good bowel sounds. Extremities: no cyanosis, clubbing, rash, edema Neuro: Awake on vent with wide-eyed stare  Follows occasional command   Labs    Chemistry Recent Labs  Lab 09/13/17 1100 09/14/17 0457 09/15/17 0339  NA 154* 161* 148*  K 3.6 3.4* 3.2*  CL 108 111 106  CO2 _1 GLUCOSE 280* 192* 240*  BUN 135* 143* 121*  CREATININE 2.92* 3.19* 3.00*  CALCIUM 9.0 8.7* 8.2*  PROT 5.9*  --   --   ALBUMIN 2.3*  --   --   AST 67*  --   --   ALT 161*  --   --   ALKPHOS 392*  --   --   BILITOT 1.6*  --   --   GFRNONAA 21* 19* 21*  GFRAA 25* 22* 24*  ANIONGAP 18* 19* 14     Hematology Recent Labs  Lab 09/13/17 1100 09/14/17 0457 09/15/17 0339  WBC 14.0* 16.5* 14.8*  RBC 5.49 5.02 4.76  HGB 14.8 13.5 12.7*  HCT 45.4 42.0 39.8  MCV 82.7 83.7 83.6  MCH 27.0 26.9 26.7  MCHC 32.6 32.1 31.9  RDW 18.2* 18.1* 18.5*  PLT 156 155 128*    Cardiac Enzymes No results for input(s): TROPONINI in the last 168 hours.  No results for input(s): TROPIPOC in the last 168 hours.   BNP No results for input(s): BNP, PROBNP in the last 168 hours.   DDimer No results for input(s): DDIMER in the last 168 hours.   Radiology    Ct Abdomen Pelvis Wo Contrast  Result Date: 09/15/2017 CLINICAL DATA:  Tenderness and mid quadrant of the left hemiabdomen. EXAM: CT ABDOMEN AND PELVIS WITHOUT CONTRAST TECHNIQUE: Multidetector CT imaging of the abdomen and pelvis was performed following the standard protocol without IV contrast. COMPARISON:  Ultrasound  of the abdomen 09/13/2017 and CT of the abdomen and pelvis from 07/23/2015 FINDINGS: Lower chest: Small to moderate bilateral pleural effusions identified. Atelectasis is identified in both lung bases. Atelectasis and airspace consolidation noted in both lung bases. Aortic atherosclerosis noted. Hepatobiliary: No focal liver abnormality. Stone within the neck of gallbladder identified measuring 1 cm, image 35 of series 4. Within the limitations of unenhanced technique no biliary dilatation noted. Pancreas: Unremarkable. No pancreatic ductal dilatation or surrounding inflammatory changes. Spleen: Normal in size without focal abnormality. Adrenals/Urinary Tract: The adrenal glands appear normal. Tiny stone within the upper pole of right kidney measures 2 mm. No left renal calculi. Bilateral pelvocaliectasis and hydroureter identified. No obstructing stone identified. There is moderate distension of the urinary bladder which measures 12.2 by 12.1 by 17.7 cm (volume = 1370 cm^3). Stomach/Bowel: Stomach appears normal. Nasogastric tube tip is in the proximal stomach. No pathologic dilatation of the small bowel loops. The appendix is not visualized separate from the right lower quadrant bowel loops. Proximal colon is unremarkable. Distal colonic diverticula identified. No acute inflammation. No pathologic dilatation of the colon. Vascular/Lymphatic: Aortic atherosclerosis. No aneurysm. No upper abdominal or pelvic adenopathy. No inguinal adenopathy. Reproductive: The prostate gland measures 3.9 x 4.9 x 4.5 cm (volume = 45 cm^3). Other: No free fluid or fluid collections. Musculoskeletal: No acute or significant osseous findings. IMPRESSION: 1. Marked distension of the urinary bladder measuring approximately 1370 cc. There is bilateral pelvocaliectasis and mild hydroureter. Correlate for any clinical signs or symptoms of bladder outlet obstruction. 2. Mild prostate gland enlargement. 3. Bilateral pleural effusions and  overlying areas of atelectasis and airspace consolidation. Cannot rule out pneumonia. 4.  Aortic Atherosclerosis (ICD10-I70.0). 5. Gallstone identified within the neck of gallbladder. Electronically Signed   By: Kerby Moors M.D.   On: 09/15/2017 00:16   US Abdomen Complete  Result Date: 09/13/2017 CLINICAL DATA:  Calcified gallstone by prior imaging. EXAM: ABDOMEN ULTRASOUND COMPLETE COMPARISON:  None. FINDINGS: Gallbladder: Shadowing calculus present near the neck of the gallbladder measuring roughly 15 mm in greatest diameter. No associated gallbladder inflammation or distention. Common bile duct: Diameter: 3.5 mm Liver: No focal lesion identified. Within normal limits in parenchymal echogenicity. Portal vein is patent on color Doppler imaging with normal direction of blood flow towards the liver. IVC: No abnormality visualized. Pancreas: Visualized portion unremarkable. Spleen: Size and appearance within normal limits. Right Kidney: Length: 11 cm. The collecting system is mildly dilated. Left Kidney: Length: 11.5 cm. There is  mild to moderate left hydronephrosis. Abdominal aorta: No aneurysm visualized. Other findings: No ascites visualized. IMPRESSION: 1. Single shadowing gallstone near the neck of the gallbladder measuring roughly 15 mm in greatest diameter. No associated gallbladder distention or inflammation. No evidence of biliary obstruction. 2. Evidence of bilateral hydronephrosis, left greater than right. Electronically Signed   By: Aletta Edouard M.D.   On: 09/13/2017 16:14   Dg Chest Port 1 View  Result Date: 09/15/2017 CLINICAL DATA:  Hypoxia EXAM: PORTABLE CHEST 1 VIEW COMPARISON:  September 14, 2017 FINDINGS: Endotracheal tube tip is 5.8 cm above the carina. Nasogastric tube tip and side port are below the diaphragm. Central catheter tip is at the cavoatrial junction. Pacemaker leads are attached to the right atrium, right ventricle, and coronary sinus. No pneumothorax. There is atelectatic  change in the left base with small left pleural effusion. There is ill-defined airspace opacity in the mid right lung region. Heart is mildly enlarged with pulmonary vascularity within normal limits. Patient is status post coronary artery bypass grafting. No adenopathy. No bone lesions. IMPRESSION: Tube and catheter positions as described without pneumothorax. Airspace consolidation right mid lung concerning for pneumonia. Left base atelectasis with small left effusion, stable. Stable cardiac prominence. Electronically Signed   By: Lowella Grip III M.D.   On: 09/15/2017 07:42   Dg Chest Port 1 View  Result Date: 09/14/2017 CLINICAL DATA:  62 year old male status post cardiac arrest and resuscitation. Respiratory failure. EXAM: PORTABLE CHEST 1 VIEW COMPARISON:  09/13/2017 and earlier. FINDINGS: Portable AP semi upright view at 0413 hours. Stable endotracheal tube tip at the level the clavicles. Enteric tube courses to the abdomen, tip not included. Right PICC line now in place, tip at the right atrium level, about a centimeter or 2 between the expected cavoatrial junction level. Left chest cardiac AICD. Stable cardiomegaly and mediastinal contours. Prior CABG. Continued confluent left lung base opacity partially obscuring the left hemidiaphragm. Vague but confluent right perihilar opacity has increased since 09/12/2017 but not significantly changed since yesterday. No pneumothorax. Stable mild pulmonary vascular congestion. Possible small left pleural effusion. IMPRESSION: 1. Right PICC line placed, tip projects 1-2 centimeters below the expected level of the cavoatrial junction. 2.  Otherwise stable lines and tubes. 3. Stable multifocal bilateral pulmonary opacity since yesterday suspicious for bilateral pneumonia. Possible small left pleural effusion. 4. Stable pulmonary vascular congestion without overt edema. Electronically Signed   By: Genevie Ann M.D.   On: 09/14/2017 06:55    Telemetry    Sinus with  biv pacing. 60s. Personally reviewed   ECG    1/17 - AS Vpaced - NSR- Personally Reviewed  Cardiac Studies    2D Echo 09/06/17: Dilated left ventricle with swirling flow in the LV apex but no obvious thrombus.  Severely reduced LV function with EF of 15%.  Diffuse hypokinesis throughout with segmental differences.  GRII DD.  Mild AI with possibly mild AS.  Mod Pulm HTN Peak PA pressures 50 mmHg.  Patient Profile     62 y.o. male with CABG x5( '02) and DES to mid Cx 2016, ICM, HTN, HL, DM, Lung mass s/p VATS who presented with out of hospital cardiac arrest.  Assessment & Plan    1. Cardiac Arrest with VT>>VF with ICD shock:  - suspect ischemic mediated with Trop ~ 70 - awake on vent but only follows simple commands. Suspect component of anoxic brain injury. - rhythm now stable - suspect we can extubate soon. Timing d/w CCM  -  will give one dose of lasix to get volume status down  - based on records has had VT in past. Amiodarone mentioned by Dr. Ellyn Hack but not ordered will start 400 po bid - Keep K> 4.0 Mg > 2.0   2. NSTEMI with CAD s/p CABG 2002 - peak troponin 70. Suspect probable vein graft closure - records from [place] reviewed today.  - Hx of CABG-2002. Last cath 2016. LIMA to LAD, SVG to diagonal, SVG to OM (occluded by cath 2016). sequential SVG to PDA and RPL (occluded by cath 2016) - Doubt has any revascularization options. With creatinine ~ 3.0 would manage medically with DAPT and statin. BP too low for b-blocker   3. Ischemic cardiomyopathy s/p [employer] PPM/ICD -- with resolved cardiogenic shock following cardiac arrest: - TTE 04/17/16 at [facility]: Normal LV size with mild concentric hypertrophy. Diffuse hypokinesis with EF 30%. - s/p STJ BIVICD - Echo this admission with EF 15%. - CVP 10. Has been getting IVF for hypernatremia. Will give one dose IV lasix 80 today.  - No ACE/ARB with AKI  - No b-blocker with low BP - check co-ox  4. AKI  -  baseline creatinine 1.3 on admit. Now hovering around 3.0 - likely ATN due to shock  - will check co-ox and see if he needs inotrope support  - renal u/s with bilateral hydronephrosis in setting of urinary retention. Now has Foley in place. UA + protein  5. Respiratory Failure: Per [facility] M - Remains intubated due to anoxic brain injury. Seems to be improving - CXR with R-sided infiltrate. On broad spectrum abx  6. Hypernatremia - Improving with fluids  7. Anoxic brain injury - as above  8. Hypokalemia - will supp  CRITICAL CARE Performed by: Glori Bickers  Total critical care time: 45 minutes  Critical care time was exclusive of separately billable procedures and treating other patients.  Critical care was necessary to treat or prevent imminent or life-threatening deterioration.  Critical care was time spent personally by me (independent of midlevel providers or residents) on the following activities: development of treatment plan with patient and/or surrogate as well as nursing, discussions with consultants, evaluation of patient's response to treatment, examination of patient, obtaining history from patient or surrogate, ordering and performing treatments and interventions, ordering and review of laboratory studies, ordering and review of radiographic studies, pulse oximetry and re-evaluation of patient's condition.   Glori Bickers, MD  12:31 PM

## 2017-09-15 NOTE — Progress Notes (Signed)
  Placed on bipap post extubation - did not tolerate and removed RT/RN report having to NTS Q2h or so  Exam post bipap Alert Slowly gazes To his wife he follows commands in Urdu - like cough, take a deep breath, - these are mild to moderate in strength only but denies pain No obvious resp distress but has weaker cough and some scretions  Plan Continue to monitor off bipap Give extra lasix 20mg  x 1  Dr. Brand Males, M.D., [facility].C.P Pulmonary and Critical Care Medicine Staff Physician, [place] Director - Interstitial Lung Disease  Program  Pulmonary [place] at [place], [place], [address]  Pager: [phone], If no answer or between  15:00h - 7:00h: call [phone] Telephone: [phone]

## 2017-09-15 NOTE — Progress Notes (Signed)
PULMONARY / CRITICAL CARE MEDICINE   Name: Mark Martin MRN: [id number] DOB: [birth year]    ADMISSION DATE:  09/05/2017   REFERRING MD: Emergency department physician  CHIEF COMPLAINT: Cardiac arrest  BRIEF  Mr.Sauceda is a 62 year old male with known ischemic cardiomyopathy, ejection fraction less than 20%, with an AICD in place.  Was in his usual state of health until 09/05/2017 when he had witnessed arrest.  Family started CPR EMS arrived and ROSC within 25 minutes with 1 dose of epinephrine.  His AICD was interrogated and showed 25 minutes of VT in PEA.  His AICD did shock him x1. He is transferred to [facility] emergency department where he was intubated and started on epinephrine drip.  He continued to have multiple episodes of PEA requiring increasing doses of epinephrine. PCCM called for admission and hypothermia protocol.     STUDIES:  09/05/2017 2D echo >>>EF 15%, possible early forming thrombus, severe diffuse hypokinesis, grade 2 diastolic dysfunction, PA peak pressure 39mmHg 09/05/2017 CT of the head >>>neg acute  1/15 EEG>>> diffuse slowing; non specific etiology patter. No epileptiform discharge.  CULTURES: Blood 1/12>>>NTD Procal 7.14 Sputum 1/12>>> normal flora  Urine culture 117 2019-negative 09/12/2017 blood cultures x2>> 09/12/2016 sputum culture>>neg  Blood ANTIBIOTICS: Rocephin 1/12>>>  09/13/2017 Zosyn>> 09/13/2017 vancomycin>>  LINES/TUBES: 09/05/2017 ETT>> 09/05/2017 R fem CVL>>> out 09/05/2017 right femoral aline>>> out Right double-lumen PICC basilic vein 0/04/3817>> 09/14/2017 Foley catheter placed for bladder obstruction>>   SIGNIFICANT EVENTS: 09/05/2017 VT PEA arrest 09/08/17 - Pressor demand decreasing, no events overnight 09/09/17 - worsening AKI. Family at beside. On vent. Daughter at bedside and wife report less responsiveness x 48h/ Last prn sedation of fent/versed on 09/06/17 - per RN moves all 4s, non purposeful and weaning on SBT. Off  pressors. Has femoral lines 1/16 - No acute change overnight.  Tolerating pressure support wean 5/5.  Looks good from respiratory standpoint.  More awake but not following commands. 1/18- Weaning this am on 30%, 5/5, pulling good volumes with prn fentanyl and versed. MAE x 4, but does not follow commands 09/14/2017 intermittently follow commands. 09/14/2017 was found to have bladder obstruction on CT of the abdomen.  Foley was placed.  SUBJECTIVE/OVERNIGHT/INTERVAL HX 09/15/2017 more awake follows commands.  Tolerating pressure support of 5 without difficulty  VITAL SIGNS: BP (!) 85/63   Pulse 61   Temp (!) 97.5 F (36.4 C)   Resp 20   Ht 5\' 6"  (1.676 m)   Wt 80 kg (176 lb 5.9 oz)   SpO2 99%   BMI 28.47 kg/m   HEMODYNAMICS:    VENTILATOR SETTINGS: Vent Mode: PSV;CPAP FiO2 (%):  [30 %] 30 % Set Rate:  [18 bmp] 18 bmp Vt Set:  [510 mL] 510 mL PEEP:  [5 cmH20] 5 cmH20 Pressure Support:  [5 cmH20] 5 cmH20 Plateau Pressure:  [21 cmH20] 21 cmH20  INTAKE / OUTPUT: I/O last 3 completed shifts: In: 4406.3 [I.V.:2526.3; NG/GT:1130; IV Piggyback:750] Out: 2570 [Urine:2570]  PHYSICAL EXAMINATION:  General: Well-nourished well-developed male no acute distress HEENT: The tracheal tube to ventilator PSY: Unable to assess Neuro: Moves right side greater than left, shakes nods appropriately interacts tracks and follows CV: Heart sounds are regular ventricular paced PULM: Decreased breath sounds in the bases GI: Abdomen is nontender, area of ecchymosis and left lower quadrant was distended on 09/14/2017 is no longer distended Extremities: warm/dry, 1+ edema  Skin: no rashes or lesions      PULMONARY Recent Labs  Lab 09/09/17 0506  PHART 7.436  PCO2ART 34.1  PO2ART 125.0*  HCO3 22.9  TCO2 24  O2SAT 99.0    CBC Recent Labs  Lab 09/13/17 1100 09/14/17 0457 09/15/17 0339  HGB 14.8 13.5 12.7*  HCT 45.4 42.0 39.8  WBC 14.0* 16.5* 14.8*  PLT 156 155 128*     COAGULATION No results for input(s): INR in the last 168 hours.  CARDIAC   No results for input(s): TROPONINI in the last 168 hours. No results for input(s): PROBNP in the last 168 hours.   CHEMISTRY Recent Labs  Lab 09/10/17 0212 09/11/17 0245 09/12/17 0250 09/13/17 0418 09/13/17 1100 09/14/17 0457 09/15/17 0339  NA 139 143 148*  --  154* 161* 148*  K 3.0* 3.1* 3.4*  --  3.6 3.4* 3.2*  CL 99* 98* 102  --  108 111 106  CO2 25 28 30   --  28 31 28   GLUCOSE 168* 225* 216*  --  280* 192* 240*  BUN 123* 127* 133*  --  135* 143* 121*  CREATININE 3.40* 3.07* 2.89*  --  2.92* 3.19* 3.00*  CALCIUM 7.9* 8.5* 8.5*  --  9.0 8.7* 8.2*  MG 1.9 2.3 2.1 2.1  --   --  2.2  PHOS 5.1* 4.8* 4.0 4.0  --   --  5.3*   Estimated Creatinine Clearance: 25.1 mL/min (A) (by C-G formula based on SCr of 3 mg/dL (H)).   LIVER Recent Labs  Lab 09/13/17 1100  AST 67*  ALT 161*  ALKPHOS 392*  BILITOT 1.6*  PROT 5.9*  ALBUMIN 2.3*     INFECTIOUS Recent Labs  Lab 09/12/17 1021 09/13/17 0418 09/14/17 0457 09/15/17 0339  LATICACIDVEN  --   --   --  1.4  PROCALCITON 5.73 5.71 6.36  --      ENDOCRINE CBG (last 3)  Recent Labs    09/14/17 2311 09/15/17 0329 09/15/17 0741  GLUCAP 206* 195* 223*         IMAGING x48h  - image(s) personally visualized  -   highlighted in bold Ct Abdomen Pelvis Wo Contrast  Result Date: 09/15/2017 CLINICAL DATA:  Tenderness and mid quadrant of the left hemiabdomen. EXAM: CT ABDOMEN AND PELVIS WITHOUT CONTRAST TECHNIQUE: Multidetector CT imaging of the abdomen and pelvis was performed following the standard protocol without IV contrast. COMPARISON:  Ultrasound of the abdomen 09/13/2017 and CT of the abdomen and pelvis from 07/23/2015 FINDINGS: Lower chest: Small to moderate bilateral pleural effusions identified. Atelectasis is identified in both lung bases. Atelectasis and airspace consolidation noted in both lung bases. Aortic atherosclerosis  noted. Hepatobiliary: No focal liver abnormality. Stone within the neck of gallbladder identified measuring 1 cm, image 35 of series 4. Within the limitations of unenhanced technique no biliary dilatation noted. Pancreas: Unremarkable. No pancreatic ductal dilatation or surrounding inflammatory changes. Spleen: Normal in size without focal abnormality. Adrenals/Urinary Tract: The adrenal glands appear normal. Tiny stone within the upper pole of right kidney measures 2 mm. No left renal calculi. Bilateral pelvocaliectasis and hydroureter identified. No obstructing stone identified. There is moderate distension of the urinary bladder which measures 12.2 by 12.1 by 17.7 cm (volume = 1370 cm^3). Stomach/Bowel: Stomach appears normal. Nasogastric tube tip is in the proximal stomach. No pathologic dilatation of the small bowel loops. The appendix is not visualized separate from the right lower quadrant bowel loops. Proximal colon is unremarkable. Distal colonic diverticula identified. No acute inflammation. No pathologic dilatation of the colon. Vascular/Lymphatic: Aortic atherosclerosis. No aneurysm. No  upper abdominal or pelvic adenopathy. No inguinal adenopathy. Reproductive: The prostate gland measures 3.9 x 4.9 x 4.5 cm (volume = 45 cm^3). Other: No free fluid or fluid collections. Musculoskeletal: No acute or significant osseous findings. IMPRESSION: 1. Marked distension of the urinary bladder measuring approximately 1370 cc. There is bilateral pelvocaliectasis and mild hydroureter. Correlate for any clinical signs or symptoms of bladder outlet obstruction. 2. Mild prostate gland enlargement. 3. Bilateral pleural effusions and overlying areas of atelectasis and airspace consolidation. Cannot rule out pneumonia. 4.  Aortic Atherosclerosis (ICD10-I70.0). 5. Gallstone identified within the neck of gallbladder. Electronically Signed   By: Kerby Moors M.D.   On: 09/15/2017 00:16   US Abdomen Complete  Result Date:  09/13/2017 CLINICAL DATA:  Calcified gallstone by prior imaging. EXAM: ABDOMEN ULTRASOUND COMPLETE COMPARISON:  None. FINDINGS: Gallbladder: Shadowing calculus present near the neck of the gallbladder measuring roughly 15 mm in greatest diameter. No associated gallbladder inflammation or distention. Common bile duct: Diameter: 3.5 mm Liver: No focal lesion identified. Within normal limits in parenchymal echogenicity. Portal vein is patent on color Doppler imaging with normal direction of blood flow towards the liver. IVC: No abnormality visualized. Pancreas: Visualized portion unremarkable. Spleen: Size and appearance within normal limits. Right Kidney: Length: 11 cm. The collecting system is mildly dilated. Left Kidney: Length: 11.5 cm. There is mild to moderate left hydronephrosis. Abdominal aorta: No aneurysm visualized. Other findings: No ascites visualized. IMPRESSION: 1. Single shadowing gallstone near the neck of the gallbladder measuring roughly 15 mm in greatest diameter. No associated gallbladder distention or inflammation. No evidence of biliary obstruction. 2. Evidence of bilateral hydronephrosis, left greater than right. Electronically Signed   By: Aletta Edouard M.D.   On: 09/13/2017 16:14   Dg Chest Port 1 View  Result Date: 09/15/2017 CLINICAL DATA:  Hypoxia EXAM: PORTABLE CHEST 1 VIEW COMPARISON:  September 14, 2017 FINDINGS: Endotracheal tube tip is 5.8 cm above the carina. Nasogastric tube tip and side port are below the diaphragm. Central catheter tip is at the cavoatrial junction. Pacemaker leads are attached to the right atrium, right ventricle, and coronary sinus. No pneumothorax. There is atelectatic change in the left base with small left pleural effusion. There is ill-defined airspace opacity in the mid right lung region. Heart is mildly enlarged with pulmonary vascularity within normal limits. Patient is status post coronary artery bypass grafting. No adenopathy. No bone lesions.  IMPRESSION: Tube and catheter positions as described without pneumothorax. Airspace consolidation right mid lung concerning for pneumonia. Left base atelectasis with small left effusion, stable. Stable cardiac prominence. Electronically Signed   By: Lowella Grip III M.D.   On: 09/15/2017 07:42   Dg Chest Port 1 View  Result Date: 09/14/2017 CLINICAL DATA:  62 year old male status post cardiac arrest and resuscitation. Respiratory failure. EXAM: PORTABLE CHEST 1 VIEW COMPARISON:  09/13/2017 and earlier. FINDINGS: Portable AP semi upright view at 0413 hours. Stable endotracheal tube tip at the level the clavicles. Enteric tube courses to the abdomen, tip not included. Right PICC line now in place, tip at the right atrium level, about a centimeter or 2 between the expected cavoatrial junction level. Left chest cardiac AICD. Stable cardiomegaly and mediastinal contours. Prior CABG. Continued confluent left lung base opacity partially obscuring the left hemidiaphragm. Vague but confluent right perihilar opacity has increased since 09/12/2017 but not significantly changed since yesterday. No pneumothorax. Stable mild pulmonary vascular congestion. Possible small left pleural effusion. IMPRESSION: 1. Right PICC line placed, tip projects 1-2  centimeters below the expected level of the cavoatrial junction. 2.  Otherwise stable lines and tubes. 3. Stable multifocal bilateral pulmonary opacity since yesterday suspicious for bilateral pneumonia. Possible small left pleural effusion. 4. Stable pulmonary vascular congestion without overt edema. Electronically Signed   By: Genevie Ann M.D.   On: 09/14/2017 06:55        ASSESSMENT / PLAN:  PULMONARY A: Vent dependent respiratory failure secondary to PEA cardiac arrest History of left adenocarcinoma with resection in 2014 CXR 1/18>> with Areas of airspace consolidation in the right upper lobe and left base, likely multifocal pneumonia.  Stable cardiac prominence.     - Tolerating SBT with good volumes Follows commands   P:   Continue CPAP/PS with goal of extubation mental status will make extubation difficult.  He may be ready for extubation, this will be a judgment call due to his altered mental status.  Will need to be available to reintubate should he fail extubation. Minimize sedation as able  Vent support - 8cc/kg  Titrate O2 for sat of 88-92%  CARDIOVASCULAR A:  Status post VT PEA arrest Ischemic cardiomyopathy - EF 15%, AICD in place  Acute on chronic combined systolic and diastolic heart failure  ST elevation MI Cardiogenic shock Amio off since 1/17   P:  Amio off Cardiology following  Diona Fanti, brilinta, statin , Heparin gtt - cards managing Reduce diuresis Holding ARB with AKI  Holding B blocker r/t hypotension  MAP goal of 65 Monitor urine output    RENAL Lab Results  Component Value Date   CREATININE 3.00 (H) 09/15/2017   CREATININE 3.19 (H) 09/14/2017   CREATININE 2.92 (H) 09/13/2017   Recent Labs  Lab 09/13/17 1100 09/14/17 0457 09/15/17 0339  K 3.6 3.4* 3.2*   Recent Labs  Lab 09/13/17 1100 09/14/17 0457 09/15/17 0339  NA 154* 161* 148*    AKI-in setting of cardiac arrest, hypotension. Hypokalemia >> repleted 1/ 20 Check mag and phosphorus as needed      P:    DC Lasix with sodium 161, 09/15/2017 sodium is decreased to 148 Follow renal functions Maintain renal perfusion Avoid nephrotoxic medications Replete electrolytes as needed D5W decreased to 50 cc an hour on 09/15/2017 continue to follow sodium. Tube feedings restarted we can add free water.    GASTROINTESTINAL A:   Distended , Firm abdomen Diminished but present  BS  Last BM 1/18, loose TF appearance CT of the abdomen 09/14/2017 demonstrated bladder distention mild hydronephrosis P:  KUB 118 did not show obstruction CT of the abdomen 09/14/2017 showed bladder distention mild hydronephrosis Add lomotil 1/19 PPI TF per nutrition  09/15/2016 currently on hold will need to restart  If does not extubate soon, will need Cortrak feeding tube  HEMATOLOGIC Anticoagulation per cardiology for concern of apical thrombus Recent Labs    09/14/17 0457 09/15/17 0339  HGB 13.5 12.7*    P:  Heparin gtt Per cardiology/ pharmacy Heparin Level goal 0.3-0.7 units per hour. Heparin for DVT prophylaxis   INFECTIOUS Recent Labs  Lab 09/11/17 0245 09/12/17 0250 09/13/17 1100 09/14/17 0457 09/15/17 0339  WBC 15.2* 12.5* 14.0* 16.5* 14.8*   Recent Labs  Lab 09/09/17 0416 09/12/17 1021 09/13/17 0418 09/14/17 0457  PROCALCITON 5.66 5.73 5.71 6.36    A:   Empiric antibiotics P: Pan cultured 1/17 no positive culture data on 09/15/1998 Pro-calcitonin 6.36 09/14/2017 Trend fever curve and WBC 09/15/2017 white count decreased to 14.8 Follow pan cultures done 1/17 Continue empiric antibiotics  for now  Anti-infectives (From admission, onward)   Start     Dose/Rate Route Frequency Ordered Stop   09/15/17 2200  vancomycin (VANCOCIN) 1,250 mg in sodium chloride 0.9 % 250 mL IVPB  Status:  Discontinued     1,250 mg 166.7 mL/hr over 90 Minutes Intravenous Every 48 hours 09/15/17 1057 09/15/17 1057   09/15/17 2200  vancomycin (VANCOCIN) 1,500 mg in sodium chloride 0.9 % 500 mL IVPB     1,500 mg 250 mL/hr over 120 Minutes Intravenous Every 48 hours 09/15/17 1057     09/14/17 1000  vancomycin (VANCOCIN) IVPB 1000 mg/200 mL premix  Status:  Discontinued     1,000 mg 200 mL/hr over 60 Minutes Intravenous Every 24 hours 09/13/17 1055 09/15/17 1049   09/13/17 1200  vancomycin (VANCOCIN) 2,000 mg in sodium chloride 0.9 % 500 mL IVPB     2,000 mg 250 mL/hr over 120 Minutes Intravenous  Once 09/13/17 1055 09/13/17 1542   09/13/17 1200  piperacillin-tazobactam (ZOSYN) IVPB 3.375 g     3.375 g 12.5 mL/hr over 240 Minutes Intravenous Every 8 hours 09/13/17 1055     09/07/17 1600  cefTRIAXone (ROCEPHIN) 1 g in dextrose 5 % 50 mL IVPB   Status:  Discontinued     1 g 100 mL/hr over 30 Minutes Intravenous Every 24 hours 09/07/17 1327 09/13/17 1004       ENDOCRINE CBG (last 3)  Recent Labs    09/14/17 2311 09/15/17 0329 09/15/17 0741  GLUCAP 206* 195* 223*   Recent Labs  Lab 09/13/17 1100 09/14/17 0457 09/15/17 0339  NA 154* 161* 148*     A:   Hyperglycemia P:  CBG Q 4  Sliding scale insulin Note currently been placed on D5W for sodium 161. He may need more insulin coverage in the near future.  Note his tube feedings are on hold for abdominal distention therefore will not add further insulin orders on 09/15/2017  NEUROLOGIC A:   Post PEA arrest does not follow commands. Head CT 1/14 and EEG 1/14 - non specific  09/14/2017 follows commands intermittently side will grip shaking hands and track. 09/15/2017 more awake, more cooperative, grips with right hand tracks and follows P:   RASS goal: -1 Minimize sedation as able   FAMILY  - Updates: No family at bedside 09/15/2017  - Inter-disciplinary family meet or Palliative Care meeting due by:  day 7   GLOBAL: 62 year old with with improving mental status daily.  Does have intermittent periods of somnolence.  He follows commands grips with his right hand and seems more awake.  He may be ready for extubation 09/15/2017 but there may be a consequence of him being reintubated.    [name] Shalika Arntz ACNP Maryanna Shape PCCM Pager [phone] till 1 pm If no answer page [phone] 09/15/2017, 10:59 AM

## 2017-09-15 NOTE — Progress Notes (Signed)
Dr. Oletta Darter (CCM) called about pt's respiratory status - on bipap and unable to clear secretions. MD contacted rounding team to assess pt at bedside.

## 2017-09-15 NOTE — Progress Notes (Signed)
CT results showed approximately 1370 cc in pt's bladder - foley catheter placed per order with 1200 cc immediately emptied. Pt no longer has abdominal distention or tenderness. Dr. Emmit Alexanders updated. Will continue to monitor pt closely.

## 2017-09-15 NOTE — Progress Notes (Signed)
[place] Progress Note Patient Name: Mark Martin DOB: 01-23-1956 MRN: [id number]   Date of Service  09/15/2017  HPI/Events of Note  CT Scan of Abdomen/Pelvis: 1. Marked distension of the urinary bladder measuring approximately 1370 cc. There is bilateral pelvocaliectasis and mild hydroureter. Correlate for any clinical signs or symptoms of bladder outlet obstruction. 2. Mild prostate gland enlargement.  eICU Interventions  Will order: 1. Place Foley Catheter.      Intervention Category Major Interventions: Other:  Lysle Dingwall 09/15/2017, 12:31 AM

## 2017-09-15 NOTE — Progress Notes (Addendum)
Pharmacy Antibiotic Note  Mark Martin is a 62 y.o. male admitted on 09/05/2017 post cardiac arrest.   Patient continues to be febrile to 102.9, wbc stable at 12.5, pct 5.7. Patient received ceftriaxone from 1/12 > 1/18. Orders received to start broad antibiotics with vancomycin and zosyn for possible sepsis.   Vancomycin trough drawn this morning is 26 (above upper goal of 20). Afebrile overnight, wbc stable at 14. Scr fairly stable at 3, good uop.  Will need to delay next dose of vancomycin until tonight.   New peak/trough expected to be 40/15 respectively   Plan: Zosyn 3.375g IV q8 hours - infuse over 4 hours Reduce vancomycin to 1500mg  q48 hours  Height: 5\' 6"  (167.6 cm) Weight: 176 lb 5.9 oz (80 kg) IBW/kg (Calculated) : 63.8  Temp (24hrs), Avg:97.2 F (36.2 C), Min:96.3 F (35.7 C), Max:97.6 F (36.4 C)  Recent Labs  Lab 09/11/17 0245 09/12/17 0250 09/13/17 1100 09/14/17 0457 09/15/17 0339 09/15/17 0947  WBC 15.2* 12.5* 14.0* 16.5* 14.8*  --   CREATININE 3.07* 2.89* 2.92* 3.19* 3.00*  --   LATICACIDVEN  --   --   --   --  1.4  --   VANCOTROUGH  --   --   --   --   --  26*    Estimated Creatinine Clearance: 25.1 mL/min (A) (by C-G formula based on SCr of 3 mg/dL (H)).    No Known Allergies  1/12 rocephin >1/17 Vanc 1/18>> Zosyn 1/18>>  1/12 blood x2- neg 1/12 resp- normal flora 1/17 blood x2-ngtd 1/17 resp - ngtd Thank you for allowing pharmacy to be a part of this patient's care.  Erin Hearing PharmD., BCPS Clinical Pharmacist 09/15/2017 10:50 AM

## 2017-09-16 ENCOUNTER — Inpatient Hospital Stay (HOSPITAL_COMMUNITY): Payer: Medicare Other

## 2017-09-16 DIAGNOSIS — Z515 Encounter for palliative care: Secondary | ICD-10-CM

## 2017-09-16 DIAGNOSIS — Z7189 Other specified counseling: Secondary | ICD-10-CM

## 2017-09-16 DIAGNOSIS — R1310 Dysphagia, unspecified: Secondary | ICD-10-CM

## 2017-09-16 LAB — PHOSPHORUS: PHOSPHORUS: 5 mg/dL — AB (ref 2.5–4.6)

## 2017-09-16 LAB — GLUCOSE, CAPILLARY
GLUCOSE-CAPILLARY: 147 mg/dL — AB (ref 65–99)
GLUCOSE-CAPILLARY: 166 mg/dL — AB (ref 65–99)
GLUCOSE-CAPILLARY: 174 mg/dL — AB (ref 65–99)
Glucose-Capillary: 159 mg/dL — ABNORMAL HIGH (ref 65–99)
Glucose-Capillary: 170 mg/dL — ABNORMAL HIGH (ref 65–99)
Glucose-Capillary: 201 mg/dL — ABNORMAL HIGH (ref 65–99)
Glucose-Capillary: 211 mg/dL — ABNORMAL HIGH (ref 65–99)

## 2017-09-16 LAB — CBC WITH DIFFERENTIAL/PLATELET
BASOS ABS: 0 10*3/uL (ref 0.0–0.1)
BASOS PCT: 0 %
Eosinophils Absolute: 0.5 10*3/uL (ref 0.0–0.7)
Eosinophils Relative: 3 %
HCT: 42.4 % (ref 39.0–52.0)
Hemoglobin: 13.5 g/dL (ref 13.0–17.0)
Lymphocytes Relative: 6 %
Lymphs Abs: 1.2 10*3/uL (ref 0.7–4.0)
MCH: 26.7 pg (ref 26.0–34.0)
MCHC: 31.8 g/dL (ref 30.0–36.0)
MCV: 83.8 fL (ref 78.0–100.0)
MONO ABS: 1.3 10*3/uL — AB (ref 0.1–1.0)
MONOS PCT: 6 %
NEUTROS PCT: 85 %
Neutro Abs: 17.3 10*3/uL — ABNORMAL HIGH (ref 1.7–7.7)
Platelets: 175 10*3/uL (ref 150–400)
RBC: 5.06 MIL/uL (ref 4.22–5.81)
RDW: 17.9 % — AB (ref 11.5–15.5)
WBC: 20.3 10*3/uL — ABNORMAL HIGH (ref 4.0–10.5)

## 2017-09-16 LAB — BASIC METABOLIC PANEL
Anion gap: 15 (ref 5–15)
BUN: 106 mg/dL — ABNORMAL HIGH (ref 6–20)
CALCIUM: 8.4 mg/dL — AB (ref 8.9–10.3)
CO2: 26 mmol/L (ref 22–32)
CREATININE: 2.86 mg/dL — AB (ref 0.61–1.24)
Chloride: 107 mmol/L (ref 101–111)
GFR calc non Af Amer: 22 mL/min — ABNORMAL LOW (ref >60)
GFR, EST AFRICAN AMERICAN: 25 mL/min — AB (ref >60)
Glucose, Bld: 214 mg/dL — ABNORMAL HIGH (ref 65–99)
Potassium: 4.2 mmol/L (ref 3.5–5.1)
SODIUM: 148 mmol/L — AB (ref 135–145)

## 2017-09-16 LAB — POCT I-STAT 3, ART BLOOD GAS (G3+)
Acid-Base Excess: 7 mmol/L — ABNORMAL HIGH (ref 0.0–2.0)
BICARBONATE: 32.3 mmol/L — AB (ref 20.0–28.0)
O2 Saturation: 100 %
PCO2 ART: 46.3 mmHg (ref 32.0–48.0)
PH ART: 7.451 — AB (ref 7.350–7.450)
PO2 ART: 250 mmHg — AB (ref 83.0–108.0)
Patient temperature: 98.6
TCO2: 34 mmol/L — ABNORMAL HIGH (ref 22–32)

## 2017-09-16 LAB — MAGNESIUM: MAGNESIUM: 2.1 mg/dL (ref 1.7–2.4)

## 2017-09-16 MED ORDER — CHLORHEXIDINE GLUCONATE 0.12% ORAL RINSE (MEDLINE KIT)
15.0000 mL | Freq: Two times a day (BID) | OROMUCOSAL | Status: DC
  Administered 2017-09-17 – 2017-09-23 (×13): 15 mL via OROMUCOSAL

## 2017-09-16 MED ORDER — FENTANYL CITRATE (PF) 100 MCG/2ML IJ SOLN
100.0000 ug | INTRAMUSCULAR | Status: DC | PRN
  Administered 2017-09-16 – 2017-09-21 (×20): 100 ug via INTRAVENOUS
  Administered 2017-09-23 (×2): 50 ug via INTRAVENOUS
  Administered 2017-09-23 (×2): 100 ug via INTRAVENOUS
  Filled 2017-09-16 (×23): qty 2

## 2017-09-16 MED ORDER — ORAL CARE MOUTH RINSE
15.0000 mL | OROMUCOSAL | Status: DC
  Administered 2017-09-16 – 2017-09-23 (×67): 15 mL via OROMUCOSAL

## 2017-09-16 MED ORDER — FENTANYL CITRATE (PF) 100 MCG/2ML IJ SOLN
100.0000 ug | INTRAMUSCULAR | Status: AC | PRN
  Administered 2017-09-16 – 2017-09-21 (×3): 100 ug via INTRAVENOUS
  Filled 2017-09-16 (×6): qty 2

## 2017-09-16 MED ORDER — POTASSIUM CHLORIDE 10 MEQ/50ML IV SOLN
10.0000 meq | INTRAVENOUS | Status: AC
  Administered 2017-09-16 (×3): 10 meq via INTRAVENOUS
  Filled 2017-09-16 (×3): qty 50

## 2017-09-16 MED ORDER — ROCURONIUM BROMIDE 50 MG/5ML IV SOLN: 0.6000 mg/kg | Freq: Once | INTRAVENOUS | Status: DC

## 2017-09-16 MED ORDER — FENTANYL CITRATE (PF) 100 MCG/2ML IJ SOLN
INTRAMUSCULAR | Status: AC
  Filled 2017-09-16: qty 2

## 2017-09-16 MED ORDER — INSULIN ASPART 100 UNIT/ML ~~LOC~~ SOLN
0.0000 [IU] | SUBCUTANEOUS | Status: DC
  Administered 2017-09-16: 3 [IU] via SUBCUTANEOUS
  Administered 2017-09-16 (×2): 5 [IU] via SUBCUTANEOUS
  Administered 2017-09-17: 3 [IU] via SUBCUTANEOUS
  Administered 2017-09-17: 8 [IU] via SUBCUTANEOUS
  Administered 2017-09-17: 3 [IU] via SUBCUTANEOUS
  Administered 2017-09-17 (×2): 2 [IU] via SUBCUTANEOUS
  Administered 2017-09-17: 8 [IU] via SUBCUTANEOUS
  Administered 2017-09-17 – 2017-09-18 (×2): 2 [IU] via SUBCUTANEOUS
  Administered 2017-09-18 – 2017-09-19 (×5): 3 [IU] via SUBCUTANEOUS
  Administered 2017-09-19 (×2): 2 [IU] via SUBCUTANEOUS
  Administered 2017-09-20: 3 [IU] via SUBCUTANEOUS
  Administered 2017-09-20 (×2): 5 [IU] via SUBCUTANEOUS
  Administered 2017-09-20: 2 [IU] via SUBCUTANEOUS
  Administered 2017-09-21 (×2): 3 [IU] via SUBCUTANEOUS
  Administered 2017-09-21: 2 [IU] via SUBCUTANEOUS
  Administered 2017-09-21: 3 [IU] via SUBCUTANEOUS
  Administered 2017-09-22: 2 [IU] via SUBCUTANEOUS
  Administered 2017-09-22 (×2): 5 [IU] via SUBCUTANEOUS
  Administered 2017-09-22 – 2017-09-23 (×5): 3 [IU] via SUBCUTANEOUS
  Administered 2017-09-23: 5 [IU] via SUBCUTANEOUS
  Administered 2017-09-23 (×2): 3 [IU] via SUBCUTANEOUS
  Administered 2017-09-23: 5 [IU] via SUBCUTANEOUS

## 2017-09-16 MED ORDER — ETOMIDATE 2 MG/ML IV SOLN
0.3000 mg/kg | Freq: Once | INTRAVENOUS | Status: AC
  Administered 2017-09-16: 20 mg via INTRAVENOUS

## 2017-09-16 MED ORDER — FUROSEMIDE 10 MG/ML IJ SOLN
80.0000 mg | Freq: Once | INTRAMUSCULAR | Status: AC
  Administered 2017-09-16: 80 mg via INTRAVENOUS
  Filled 2017-09-16: qty 8

## 2017-09-16 NOTE — Progress Notes (Signed)
eLink Physician-Brief Progress Note Patient Name: Mark Martin DOB: 14-Sep-1955 MRN: [id number]   Date of Service  09/16/2017  HPI/Events of Note  Patient bleeding from oropharynx.   eICU Interventions  Hold Brilinta dose.      Intervention Category Major Interventions: Other:  Lysle Dingwall 09/16/2017, 9:56 PM

## 2017-09-16 NOTE — Progress Notes (Signed)
Pt having copious secretions orally and via ET tube, pink tinged and bloody in appearance. PM dose of Brillinta held, Dr. Oletta Darter made aware. Will continue to monitor.

## 2017-09-16 NOTE — Progress Notes (Signed)
[facility] Progress Note Patient Name: Haley Roza DOB: 06/29/1956 MRN: [id number]   Date of Service  09/16/2017  HPI/Events of Note  Liquid stools X 3 - Now with skin breakdown. Request for Flexiseal.   eICU Interventions  Will order Flexiseal.      Intervention Category Major Interventions: Other:  Lysle Dingwall 09/16/2017, 11:33 PM

## 2017-09-16 NOTE — Procedures (Signed)
Intubation Procedure Note Mark Martin [id number] [birth year]  Procedure: Intubation Indications: Airway protection and maintenance  Procedure Details Consent: Risks of procedure as well as the alternatives and risks of each were explained to the (patient/caregiver).  Consent for procedure obtained. Time Out: Verified patient identification, verified procedure, site/side was marked, verified correct patient position, special equipment/implants available, medications/allergies/relevent history reviewed, required imaging and test results available.  Performed  Maximum sterile technique was used including cap, gloves, gown, hand hygiene and mask.  MAC and 4    Evaluation Hemodynamic Status: BP stable throughout; O2 sats: stable throughout Patient's Current Condition: stable Complications: No apparent complications Patient did tolerate procedure well. Chest X-ray ordered to verify placement.  CXR: pending.  Pt intubated using glidescope #4 blade with 7.5 ett secured at 24 at the lip on the 1st attempt. Positive color change noted on etco2, bilateral bs, direct visualization, CXR pending. Small amount of hemoptysis suctioned from ett after procedure   Jesse Sans 09/16/2017

## 2017-09-16 NOTE — Progress Notes (Signed)
Inpatient Rehabilitation  Per PT request, patient was screened by Gunnar Fusi for appropriateness for an Inpatient Acute Rehab consult.  At this time we are recommending an Inpatient Rehab consult as well as orders for SLP to evaluate and address suspected cognitive-linguistic deficits.  Please order if you are agreeable.    Carmelia Roller., CCC/SLP Admission Coordinator  [place]  Cell [phone]

## 2017-09-16 NOTE — Progress Notes (Signed)
Patient intubated @1458  for airway protection by Dr. [name] and RT; 7.5 ETT 25cm @ lip  Given: 2mg  versed  155mcg fentanyl 20mg  etomidate 50mg  rocuronium  VSS

## 2017-09-16 NOTE — Evaluation (Addendum)
Clinical/Bedside Swallow Evaluation Patient Details  Name: Mark Martin MRN: [id number] Date of Birth: December 18, 1955  Today's Date: 09/16/2017 Time: SLP Start Time (ACUTE ONLY): 1035 SLP Stop Time (ACUTE ONLY): 1105 SLP Time Calculation (min) (ACUTE ONLY): 30 min  Past Medical History:  Past Medical History:  Diagnosis Date  . AICD (automatic cardioverter/defibrillator) present   . Ankle injury    RIGHT  . Arthritis   . BPH (benign prostatic hypertrophy)   . Cancer ([place])   . Coronary artery disease   . Diabetes mellitus without complication ([place])    TYPE 2  . Dysrhythmia   . Glaucoma   . H/O: knee surgery    2 PINS IN RIGHT KNEE AND IN RIGHT ANKLE  . Hyperlipidemia   . Hypertension   . Mass of lung    RIGHT UPPER LOBE  . Myocardial infarction ([place])   . 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 Shortness of breath    WITH EXERTION   Past Surgical History:  Past Surgical History:  Procedure Laterality Date  . APPENDECTOMY    . CORONARY ARTERY BYPASS GRAFT     CABG X 5 08/14/2001  . EYE SURGERY Left    CATARACT  . INSERT / REPLACE / REMOVE PACEMAKER    . THORACOTOMY/LOBECTOMY Left 06/02/2013   Procedure: THORACOTOMY/LOBECTOMY;  Surgeon: Grace Isaac, MD;  Location: [facility];  Service: Thoracic;  Laterality: Left;  Marland Kitchen VIDEO ASSISTED THORACOSCOPY (VATS)/WEDGE RESECTION Left 06/02/2013   Procedure: VIDEO ASSISTED THORACOSCOPY (VATS)/WEDGE RESECTION;  Surgeon: Grace Isaac, MD;  Location: [facility];  Service: Thoracic;  Laterality: Left;  Marland Kitchen VIDEO BRONCHOSCOPY N/A 06/02/2013   Procedure: VIDEO BRONCHOSCOPY;  Surgeon: Grace Isaac, MD;  [facility];  Service: Thoracic;  Laterality: N/A;   HPI:  62 year old male admitted 09/05/17 with cardiac arrest. PMH significant for CAD/CABG, PEA 2016, DM, HTN, HLD, RUL mass, MI   Assessment / Plan / Recommendation Clinical  Impression  Pt seen at bedside for assessment of po readiness. Oral care was completed with suction. Pt was noted to have only one tooth.  Unable to complete full assessment of oral motor strength and function, as pt allowed minimal mouth opening and was unable to folow directions even with gestures provided. Pt was aphonic throughout this evaluation, therefore assessment of vocal quality (wet, hoarse, weak, WFL) was not possible.   Trials of ice chips were accepted, with poor awareness of bolus anticipated. Anticipate delayed swallow reflex, however laryngeal elevation appeared adequate on palpation. No overt s/s aspiration were observed following ice chip presentations, however, silent aspiration cannot be detected at bedside.  Pt declined presentations of water (via cup or straw) and puree.   At this time, information gleaned from this assessment is limited to presence of swallow reflex and adequate laryngeal elevation. Recommend NPO status to continue, including medication. ST will continue to assess po/MBS readiness, optimally when family is present to facilitate effective communication.  Pt currently has no means of nutritional support. RN informed of results and recommendations. Will continue to follow.   SLP Visit Diagnosis: Dysphagia, unspecified (R13.10)    Aspiration Risk  Moderate aspiration risk    Diet Recommendation NPO   Medication Administration: Via alternative means    Other  Recommendations Oral Care Recommendations: Oral care prior to ice chip/H20  Follow up Recommendations (TBD)      Frequency and Duration min 2x/week  2 weeks       Prognosis Prognosis for Safe Diet Advancement: Fair Barriers/Prognosis Comment: language barrier      Swallow Study   General Date of Onset: 09/05/17 HPI: 62 year old male admitted 09/05/17 with cardiac arrest. PMH significant for CAD/CABG, PEA 2016,  Type of Study: Bedside Swallow Evaluation Previous Swallow Assessment: none  found Diet Prior to this Study: NPO Temperature Spikes Noted: No Respiratory Status: Nasal cannula History of Recent Intubation: Yes Length of Intubations (days): 10 days Date extubated: 09/15/17 Behavior/Cognition: Alert;Doesn't follow directions(nonvocal) Oral Cavity Assessment: (difficult to assess - minimal mouth opening acheived) Oral Care Completed by SLP: Yes Oral Cavity - Dentition: Missing dentition(1 tooth seen) Self-Feeding Abilities: Total assist Patient Positioning: Upright in bed Baseline Vocal Quality: Aphonic Volitional Cough: Cognitively unable to elicit Volitional Swallow: Unable to elicit    Oral/Motor/Sensory Function Overall Oral Motor/Sensory Function: (unable to assess at this time)   Ice Chips Ice chips: Impaired Presentation: Spoon Oral Phase Impairments: Poor awareness of bolus Pharyngeal Phase Impairments: Suspected delayed Swallow   Thin Liquid Thin Liquid: Impaired Other Comments: Pt unable to drink from straw, kept mouth closed for cup sip trials    Nectar Thick Nectar Thick Liquid: Not tested   Honey Thick Honey Thick Liquid: Not tested   Puree Other Comments: pt would not accept   Solid   Myiesha Edgar B. [facility] Ore [facility], CCC-SLP Speech Language Pathologist [phone]  Solid: Not tested        Shonna Chock 09/16/2017,11:10 AM

## 2017-09-16 NOTE — Progress Notes (Signed)
Extended discussion with the family regarding respiratory failure and inability to protect his airway.  After a long discussion, decision was made to make patient a full code with intubation now and likely trach in the next few days.  Will proceed with intubation.   The patient is critically ill with multiple organ systems failure and requires high complexity decision making for assessment and support, frequent evaluation and titration of therapies, application of advanced monitoring technologies and extensive interpretation of multiple databases.   Critical Care Time devoted to patient care services described in this note is  60  Minutes. This time reflects time of care of this signee Dr [name]. This critical care time does not reflect procedure time, or teaching time or supervisory time of PA/NP/Med student/Med Resident etc but could involve care discussion time.  Rush Farmer, M.D. [facility] Pulmonary/Critical Care Medicine. Pager: [phone]. After hours pager: [phone].

## 2017-09-16 NOTE — Progress Notes (Signed)
PULMONARY / CRITICAL CARE MEDICINE   Name: Mark Martin MRN: [id number] DOB: 1956/03/21    ADMISSION DATE:  09/05/2017   REFERRING MD: Emergency department physician  CHIEF COMPLAINT: Cardiac arrest  BRIEF  Mark Martin is a 62 year old male with known ischemic cardiomyopathy, ejection fraction less than 20%, with an AICD in place.  Was in his usual state of health until 09/05/2017 when he had witnessed arrest.  Family started CPR EMS arrived and ROSC within 25 minutes with 1 dose of epinephrine.  His AICD was interrogated and showed 25 minutes of VT in PEA.  His AICD did shock him x1. He is transferred to [facility] emergency department where he was intubated and started on epinephrine drip.  He continued to have multiple episodes of PEA requiring increasing doses of epinephrine. PCCM called for admission and hypothermia protocol.     STUDIES:  09/05/2017 2D echo >>>EF 15%, possible early forming thrombus, severe diffuse hypokinesis, grade 2 diastolic dysfunction, PA peak pressure 59mmHg 09/05/2017 CT of the head >>>neg acute  1/15 EEG>>> diffuse slowing; non specific etiology patter. No epileptiform discharge. 1/19: CT abd/pelvis 1. Marked distension of the urinary bladder measuring approximately 1370 cc. There is bilateral pelvocaliectasis and mild hydroureter. Correlate for any clinical signs or symptoms of bladder outlet obstruction. 2. Mild prostate gland enlargement. 3. Bilateral pleural effusions and overlying areas of atelectasis and airspace consolidation. Cannot rule out pneumonia. 4.  Aortic Atherosclerosis (ICD10-I70.0). 5. Gallstone identified within the neck of gallbladder. Renal US 1/20: Mild hydronephrosis on the left.  No hydronephrosis on the right. CULTURES: Blood 1/12>>>NTD Procal 7.14 Sputum 1/12>>> normal flora  Urine culture 117 2019-negative 09/12/2017 blood cultures x2>> 09/12/2016 sputum culture>>neg  Blood ANTIBIOTICS: Rocephin 1/12>>>  09/13/2017  Zosyn>> 09/13/2017 vancomycin>>  LINES/TUBES: 09/05/2017 ETT>> 09/05/2017 R fem CVL>>> out 09/05/2017 right femoral aline>>> out Right double-lumen PICC basilic vein 4/74/2595>> 09/14/2017 Foley catheter placed for bladder obstruction>>   SIGNIFICANT EVENTS: 09/05/2017 VT PEA arrest 09/08/17 - Pressor demand decreasing, no events overnight 09/09/17 - worsening AKI. Family at beside. On vent. Daughter at bedside and wife report less responsiveness x 48h/ Last prn sedation of fent/versed on 09/06/17 - per RN moves all 4s, non purposeful and weaning on SBT. Off pressors. Has femoral lines 1/16 - No acute change overnight.  Tolerating pressure support wean 5/5.  Looks good from respiratory standpoint.  More awake but not following commands. 1/18- Weaning this am on 30%, 5/5, pulling good volumes with prn fentanyl and versed. MAE x 4, but does not follow commands 09/14/2017 intermittently follow commands. 09/14/2017 was found to have bladder obstruction on CT of the abdomen.  Foley was placed.  SUBJECTIVE/OVERNIGHT/INTERVAL HX Extubated on 1/20  VITAL SIGNS: BP 104/72 (BP Location: Left Arm)   Pulse 81   Temp 98.2 F (36.8 C) (Oral)   Resp (!) 27   Ht 5\' 6"  (1.676 m)   Wt 176 lb 2.4 oz (79.9 kg)   SpO2 97%   BMI 28.43 kg/m   HEMODYNAMICS: CVP:  [10 mmHg] 10 mmHg  VENTILATOR SETTINGS: Vent Mode: PCV;BIPAP FiO2 (%):  [30 %-40 %] 40 % Set Rate:  [10 bmp] 10 bmp PEEP:  [5 cmH20] 5 cmH20 Pressure Support:  [5 cmH20-10 cmH20] 10 cmH20  INTAKE / OUTPUT:  Intake/Output Summary (Last 24 hours) at 09/16/2017 0930 Last data filed at 09/16/2017 0800 Gross per 24 hour  Intake 2170.53 ml  Output 2385 ml  Net -214.47 ml     PHYSICAL EXAMINATION:  General: Chronically  ill-appearing 62 year old male currently remains encephalopathic, confused, moving all extremities. HEENT: Poor dentition, mucous membranes are dry, neck veins flat, rhonchus upper airway noises appreciated.   pulmonary:  Scattered rhonchi, equal chest rise, no accessory use Cardiac: Regular irregular with paced rhythm Extremities: No significant edema brisk cap refill warm Abdomen: Soft nontender positive bowel sounds Neuro: Awake, moves all extremities, nonverbal  PULMONARY Recent Labs  Lab 09/15/17 1200 09/15/17 2125  PHART  --  7.453*  PCO2ART  --  41.5  PO2ART  --  111*  HCO3  --  28.6*  O2SAT 58.1 98.0    CBC Recent Labs  Lab 09/14/17 0457 09/15/17 0339 09/16/17 0518  HGB 13.5 12.7* 13.5  HCT 42.0 39.8 42.4  WBC 16.5* 14.8* 20.3*  PLT 155 128* 175    COAGULATION No results for input(s): INR in the last 168 hours.  CARDIAC   No results for input(s): TROPONINI in the last 168 hours. No results for input(s): PROBNP in the last 168 hours.   CHEMISTRY Recent Labs  Lab 09/11/17 0245 09/12/17 0250 09/13/17 0418 09/13/17 1100 09/14/17 0457 09/15/17 0339 09/15/17 2255 09/16/17 0518  NA 143 148*  --  154* 161* 148* 145 148*  K 3.1* 3.4*  --  3.6 3.4* 3.2* 3.7 4.2  CL 98* 102  --  108 111 106 105 107  CO2 28 30  --  28 31 28 27 26   GLUCOSE 225* 216*  --  280* 192* 240* 205* 214*  BUN 127* 133*  --  135* 143* 121* 111* 106*  CREATININE 3.07* 2.89*  --  2.92* 3.19* 3.00* 2.90* 2.86*  CALCIUM 8.5* 8.5*  --  9.0 8.7* 8.2* 8.2* 8.4*  MG 2.3 2.1 2.1  --   --  2.2 2.1 2.1  PHOS 4.8* 4.0 4.0  --   --  5.3*  --  5.0*   Estimated Creatinine Clearance: 26.3 mL/min (A) (by C-G formula based on SCr of 2.86 mg/dL (H)).   LIVER Recent Labs  Lab 09/13/17 1100  AST 67*  ALT 161*  ALKPHOS 392*  BILITOT 1.6*  PROT 5.9*  ALBUMIN 2.3*     INFECTIOUS Recent Labs  Lab 09/12/17 1021 09/13/17 0418 09/14/17 0457 09/15/17 0339  LATICACIDVEN  --   --   --  1.4  PROCALCITON 5.73 5.71 6.36  --      ENDOCRINE CBG (last 3)  Recent Labs    09/15/17 1912 09/16/17 0000 09/16/17 0742  GLUCAP 186* 159* 170*     IMAGING x48h  - image(s) personally visualized  -   highlighted in  bold Ct Abdomen Pelvis Wo Contrast  Result Date: 09/15/2017 CLINICAL DATA:  Tenderness and mid quadrant of the left hemiabdomen. EXAM: CT ABDOMEN AND PELVIS WITHOUT CONTRAST TECHNIQUE: Multidetector CT imaging of the abdomen and pelvis was performed following the standard protocol without IV contrast. COMPARISON:  Ultrasound of the abdomen 09/13/2017 and CT of the abdomen and pelvis from 07/23/2015 FINDINGS: Lower chest: Small to moderate bilateral pleural effusions identified. Atelectasis is identified in both lung bases. Atelectasis and airspace consolidation noted in both lung bases. Aortic atherosclerosis noted. Hepatobiliary: No focal liver abnormality. Stone within the neck of gallbladder identified measuring 1 cm, image 35 of series 4. Within the limitations of unenhanced technique no biliary dilatation noted. Pancreas: Unremarkable. No pancreatic ductal dilatation or surrounding inflammatory changes. Spleen: Normal in size without focal abnormality. Adrenals/Urinary Tract: The adrenal glands appear normal. Tiny stone within the upper pole of right  kidney measures 2 mm. No left renal calculi. Bilateral pelvocaliectasis and hydroureter identified. No obstructing stone identified. There is moderate distension of the urinary bladder which measures 12.2 by 12.1 by 17.7 cm (volume = 1370 cm^3). Stomach/Bowel: Stomach appears normal. Nasogastric tube tip is in the proximal stomach. No pathologic dilatation of the small bowel loops. The appendix is not visualized separate from the right lower quadrant bowel loops. Proximal colon is unremarkable. Distal colonic diverticula identified. No acute inflammation. No pathologic dilatation of the colon. Vascular/Lymphatic: Aortic atherosclerosis. No aneurysm. No upper abdominal or pelvic adenopathy. No inguinal adenopathy. Reproductive: The prostate gland measures 3.9 x 4.9 x 4.5 cm (volume = 45 cm^3). Other: No free fluid or fluid collections. Musculoskeletal: No acute  or significant osseous findings. IMPRESSION: 1. Marked distension of the urinary bladder measuring approximately 1370 cc. There is bilateral pelvocaliectasis and mild hydroureter. Correlate for any clinical signs or symptoms of bladder outlet obstruction. 2. Mild prostate gland enlargement. 3. Bilateral pleural effusions and overlying areas of atelectasis and airspace consolidation. Cannot rule out pneumonia. 4.  Aortic Atherosclerosis (ICD10-I70.0). 5. Gallstone identified within the neck of gallbladder. Electronically Signed   By: Kerby Moors M.D.   On: 09/15/2017 00:16   US Renal  Result Date: 09/15/2017 CLINICAL DATA:  Follow-up hydronephrosis. EXAM: RENAL / URINARY TRACT ULTRASOUND COMPLETE COMPARISON:  CT scan September 14, 2017 FINDINGS: Right Kidney: Length: 10.9 cm.  No hydronephrosis. Left Kidney: Length: 11.4 cm.  Mild hydronephrosis. Bladder: Decompressed with a catheter. IMPRESSION: Mild hydronephrosis on the left.  No hydronephrosis on the right. Electronically Signed   By: Dorise Bullion III M.D   On: 09/15/2017 20:32   Dg Chest Port 1 View  Result Date: 09/16/2017 CLINICAL DATA:  62 year old male with history of respiratory distress. EXAM: PORTABLE CHEST 1 VIEW COMPARISON:  Chest x-ray 09/15/2017. FINDINGS: Previously noted endotracheal tube and nasogastric tube have both been removed. There is a right upper extremity PICC with tip terminating in the superior cavoatrial junction. Left-sided biventricular pacemaker/AICD in place with lead tips projecting over the expected location of the right atrium, right ventricular apex and lateral wall of the left ventricle via the coronary sinus and coronary veins. Lung volumes are low. Patchy multifocal interstitial and airspace disease. Small left pleural effusion. Cephalization of the pulmonary vasculature. Mild cardiomegaly. Upper mediastinal contours are distorted by patient's rotation to the right. Aortic atherosclerosis. IMPRESSION: 1. Support  apparatus and postoperative changes, as above. 2. The appearance of the chest is most compatible with mild to moderate congestive heart failure. The possibility of superimposed pneumonia is not excluded, but not strongly favored. Electronically Signed   By: Vinnie Langton M.D.   On: 09/16/2017 07:39   Dg Chest Port 1 View  Result Date: 09/15/2017 CLINICAL DATA:  Hypoxia EXAM: PORTABLE CHEST 1 VIEW COMPARISON:  September 14, 2017 FINDINGS: Endotracheal tube tip is 5.8 cm above the carina. Nasogastric tube tip and side port are below the diaphragm. Central catheter tip is at the cavoatrial junction. Pacemaker leads are attached to the right atrium, right ventricle, and coronary sinus. No pneumothorax. There is atelectatic change in the left base with small left pleural effusion. There is ill-defined airspace opacity in the mid right lung region. Heart is mildly enlarged with pulmonary vascularity within normal limits. Patient is status post coronary artery bypass grafting. No adenopathy. No bone lesions. IMPRESSION: Tube and catheter positions as described without pneumothorax. Airspace consolidation right mid lung concerning for pneumonia. Left base atelectasis with small  left effusion, stable. Stable cardiac prominence. Electronically Signed   By: Lowella Grip III M.D.   On: 09/15/2017 07:42    ASSESSMENT / PLAN:  s/p VT/VF arrest in setting of NSTEMI Ischemic cardiomyopathy - EF 15%, AICD in place  Cardiogenic shock (resolved) Atrial fib  plan Medical rx w/asa, brilinta and statin Eventually assess for BB rx Holding ace I/ARB given AKI amio per cards Intermittent lasix  Heparin gtt on hold due to hematuria   Anoxic encephalopathy status post PEA arrest w/ ICU related physical  deconditioning  Plan Supportive care Minimize sedation OOB Routine delirium interventions   Dysphagia  Plan NPO for now Will probably need alt route of feeding  In next 24 hrs  Resolved acute respiratory  failure s/p cardiac arrest Extubated on 1/19, remains at high risk for re-intubation d/t encephalopathy airway maintenance concerns.  cxr personally reviewed: PPM in place. Bilateral edema w/out much change Plan Pulse ox  NTS PRN o2 as needed   AKI: initial insult:  cardiac arrest but then complicated by diuresis and Urinary retention w/ MIld right hydronephrosis   on 1/19 w/ MIld Hydronephrosis identified via f/c on CT of the abdomen.  -f/c placed -scr improved some Plan Cont to renal dose meds  Strict I&O Urology consult  Fluid and electrolyte imbalance: hypernatremia Plan Cont free water replacement    Fever w/ leukocytosis on 1/17 with elevated pro-calcitonin >source unclear UC neg, BC neg sputum neg Plan Day 4 vanc and zosyn  Stop vanc Urology consult for obstructive uropathy  Hyperglycemia  Plan ssi  FAMILY  - Updates: No family at bedside 09/15/2017  - Inter-disciplinary family meet or Palliative Care meeting due by:  day [place] ACNP-BC [place] Pager # [phone] OR # [phone] if no answer  Attending Note:  62 year old male presenting after a cardiac arrest.  Patient was extubated but on exam his airway protection is questionable at best.  Patient is gurgling and clearly unable to protect his airway.  I reviewed CXR myself, infiltrate noted.  Will proceed with intubation today.  Will need to arrange for a family conference in AM for inform of the need for tracheostomy if that is what the family wishes for.  Currently no family bedside.  PCCM will continue to follow.  Family coming right now for further discussions.  The patient is critically ill with multiple organ systems failure and requires high complexity decision making for assessment and support, frequent evaluation and titration of therapies, application of advanced monitoring technologies and extensive interpretation of multiple databases.   Critical Care Time devoted  to patient care services described in this note is  35  Minutes. This time reflects time of care of this signee Dr [name]. This critical care time does not reflect procedure time, or teaching time or supervisory time of PA/NP/Med student/Med Resident etc but could involve care discussion time.  Rush Farmer, M.D. [facility] Pulmonary/Critical Care Medicine. Pager: [phone]. After hours pager: [phone].

## 2017-09-16 NOTE — Progress Notes (Signed)
[facility] Progress Note Patient Name: Dickie Cloe DOB: July 01, 1956 MRN: [id number]   Date of Service  09/16/2017  HPI/Events of Note  K+ = 3.7 and Creatinine = 2.9. Goal K+ > 4.0.  eICU Interventions  Wll replace K+.      Intervention Category Major Interventions: Electrolyte abnormality - evaluation and management  Sommer,Steven Eugene 09/16/2017, 12:42 AM

## 2017-09-16 NOTE — Progress Notes (Signed)
Echocardiogram from 09/06/2017: Study Conclusions   - Left ventricle: No obvious LV thrombus but there is swirling of   blood in the LV apex which indicates sluggish flow and possible   early forming thrombus. The cavity size was mildly dilated.   Systolic function was severely reduced. The estimated ejection   fraction was 15%. Severe diffuse hypokinesis with distinct   regional wall motion abnormalities. Features are consistent with   a pseudonormal left ventricular filling pattern, with concomitant   abnormal relaxation and increased filling pressure (grade 2   diastolic dysfunction). Doppler parameters are consistent with   high ventricular filling pressure. - Aortic valve: Valve mobility was restricted. There was mild   regurgitation. Valve area (VTI): 1.01 cm^2. Valve area (Vmax):   0.85 cm^2. Valve area (Vmean): 0.85 cm^2. - Pulmonic valve: There was trivial regurgitation. - Pulmonary arteries: PA peak pressure: 50 mm Hg (S). - Impressions: No obvious LV thrombus but there is swirling of   blood in the LV apex which indicates sluggish flow and possible   early forming thrombus.   Impressions:   - No obvious LV thrombus but there is swirling of blood in the LV   apex which indicates sluggish flow and possible early forming   thrombus. The right ventricular systolic pressure was increased   consistent with moderate pulmonary hypertension.   IMPRESSION: 1.  Ischemic cardiomyopathy with ventricular fibrillation cardiac arrest related to substrate.  Patient has been resuscitated, has significant reduction in LVEF, acute on chronic kidney disease stage IV, and encephalopathy. 2.  At risk for LV thrombus but no definitive diagnosis.  PLAN:  1.  Optimize therapy for ischemic cardiomyopathy with chronic systolic heart failure when appropriate.  Kidney function is still marginal currently. 2.  Continue amiodarone and converted to oral dosing when patient able to take p.o.  Overall  poor prognosis. We will follow as needed. I would not recommend chronic anticoagulation therapy based upon above echo results.

## 2017-09-16 NOTE — Procedures (Signed)
OGT placement by MD  OGT placed under direct laryngoscopy verified by ausculation.  Rush Farmer, M.D. [facility] Pulmonary/Critical Care Medicine. Pager: [phone]. After hours pager: [phone].

## 2017-09-16 NOTE — Procedures (Signed)
Intubation Procedure Note Mark Martin [id number] 1956/06/07  Procedure: Intubation Indications: Airway protection and maintenance  Procedure Details Consent: Risks of procedure as well as the alternatives and risks of each were explained to the (patient/caregiver).  Consent for procedure obtained. Time Out: Verified patient identification, verified procedure, site/side was marked, verified correct patient position, special equipment/implants available, medications/allergies/relevent history reviewed, required imaging and test results available.  Performed  Maximum sterile technique was used including gloves, hand hygiene and mask.  MAC    Evaluation Hemodynamic Status: BP stable throughout; O2 sats: stable throughout Patient's Current Condition: stable Complications: No apparent complications Patient did tolerate procedure well. Chest X-ray ordered to verify placement.  CXR: pending.   Mark Martin 09/16/2017

## 2017-09-16 NOTE — Progress Notes (Signed)
eLink Physician-Brief Progress Note Patient Name: Mark Martin DOB: [birth year] MRN: [id number]   Date of Service  09/16/2017  HPI/Events of Note  Patient intubated and venilated earlier today. No ventilator orders written.   eICU Interventions  Will order: 1. Mechanical Ventilation: 100%/PRVC 14/TV 500/P 5. 2. ABG at 10 PM.      Intervention Category Major Interventions: Respiratory failure - evaluation and management  Logann Whitebread [place] 09/16/2017, 8:50 PM

## 2017-09-16 NOTE — Evaluation (Signed)
Physical Therapy Evaluation Patient Details Name: Mark Martin MRN: [id number] DOB: October 30, 1955 Today's Date: 09/16/2017   History of Present Illness  Pt is a 62 y.o. male admitted 09/05/17 with cardiac arrest with downtime ~20 minutes before ROSC. Intubated 1/10-1/20. Pt also with worsening AKI, acute encephalopathy, bilateral pleural effusions, and bladder distension. Abnormal EEG withmoderatediffuse backgroundslowing. CT 1/12 & 1/14 show microvascular ischemic disease without superimposed acute intracranial process. PMH includes cardiomyopathy, CAD/CABG, PEA 2016, pacemaker/ICD, DM, HTN, HLD, RUL mass, MI.    Clinical Impression  Pt presents with decreased attention, decreased awareness, and an overall decrease in functional mobility secondary to above. Pt without verbalizations throughout session and not following any gestural commands; no family present to discuss baseline PLOF and home support. Today, pt required maxA (+2 safety) for mobility, demonstrating some baseline functional strength and postural reactions throughout evaluation, but not to command. Pt would benefit from continued acute PT services to maximize functional mobility and independence prior to d/c with intensive CIR-level therapies.     Follow Up Recommendations CIR;Supervision/Assistance - 24 hour    Equipment Recommendations  (TBD)    Recommendations for Other Services OT consult     Precautions / Restrictions Precautions Precautions: Fall Restrictions Weight Bearing Restrictions: No      Mobility  Bed Mobility Overal bed mobility: Needs Assistance Bed Mobility: Supine to Sit     Supine to sit: Max assist;HOB elevated;+2 for safety/equipment     General bed mobility comments: Pt sliding LLE OOB without cues, but not following commands for any mobility, requiring maxA to sit. Strong posterior pushing, requiring maxA to position forward flexed posture and break up extension  (synergy?)  Transfers Overall transfer level: Needs assistance Equipment used: None Transfers: Sit to/from Stand Sit to Stand: Max assist;+2 safety/equipment         General transfer comment: MaxA (+2 safety) for patient to stand 2x for pericare (bowel incontinence prior to and during session). Pt able to self-correct upright posture, requiring intermittent minA for L knee instability; demonstrated good hip extension without cues, but fatiguing quickly and in pain with pericare. Strong extensor synergy when attempting to sit reaching out for UE support   Ambulation/Gait             General Gait Details: Deferred  Stairs            Wheelchair Mobility    Modified Rankin (Stroke Patients Only)       Balance Overall balance assessment: Needs assistance Sitting-balance support: No upper extremity supported;Feet supported Sitting balance-Leahy Scale: Poor Sitting balance - Comments: Intermittent min guard for seated balance once positioned; decreased awareness of posture, requiring maxA to keep from sliding off bed. Intermittent min-maxA to correct posterior and lateral leaning   Standing balance support: Bilateral upper extremity supported Standing balance-Leahy Scale: Poor Standing balance comment: MaxA to maintain standing balance                             Pertinent Vitals/Pain Pain Assessment: No/denies pain Faces Pain Scale: Hurts little more Pain Location: Grimacing during pericare and to painful stimuli in all 4 extremities Pain Descriptors / Indicators: Grimacing Pain Intervention(s): Monitored during session;Repositioned    Home Living                   Additional Comments: Per chart, from home with spouse. No family present to determine support and home set-up    Prior Function  Comments: No family present to determine support and home set-up     Hand Dominance        Extremity/Trunk Assessment   Upper  Extremity Assessment Upper Extremity Assessment: Generalized weakness(Not moving to command, but functional strength at least 3/5 noted with mobility)    Lower Extremity Assessment Lower Extremity Assessment: Generalized weakness;RLE deficits/detail;LLE deficits/detail RLE Deficits / Details: Not moving to command, but functional strength at least 3/5 noted with hip flex/abd/ext and knee flex/ext with mobility LLE Deficits / Details: Not moving to command, but functional strength at least 3/5 noted with hip flex/abd/ext and knee flex/ext with mobility       Communication   Communication: Expressive difficulties;Prefers language other than English(Pt with no verbalizations)  Cognition Arousal/Alertness: Awake/alert Behavior During Therapy: Flat affect;Impulsive Overall Cognitive Status: No family/caregiver present to determine baseline cognitive functioning Area of Impairment: Attention;Following commands;Awareness                   Current Attention Level: Focused   Following Commands: Follows one step commands inconsistently   Awareness: Intellectual   General Comments: Per chart, does not seem like pt is native [place] speaker (no family present to determine baseline). Pt smiled upon PT entering room and waving hello. Squeezed fingers when placed in bilat palms; responded to painful stimuli in all four extremities. Other than this, not following any simple gestural commands despite multimodal cues. Decreased coordination/dysmetria(?) in RUE when attempting to grab sock; did not move to put sock on foot despite demonstration and cues (motor apraxia?). Very easily distracted      General Comments General comments (skin integrity, edema, etc.): Assist from RN and NT    Exercises     Assessment/Plan    PT Assessment Patient needs continued PT services  PT Problem List Decreased strength;Decreased range of motion;Decreased activity tolerance;Decreased balance;Decreased  mobility;Decreased coordination;Decreased cognition;Decreased knowledge of use of DME;Decreased safety awareness;Decreased skin integrity       PT Treatment Interventions DME instruction;Gait training;Stair training;Functional mobility training;Therapeutic activities;Therapeutic exercise;Balance training;Neuromuscular re-education;Cognitive remediation;Patient/family education    PT Goals (Current goals can be found in the Care Plan section)  Acute Rehab PT Goals Patient Stated Goal: None stated PT Goal Formulation: Patient unable to participate in goal setting Time For Goal Achievement: 09/30/17    Frequency Min 3X/week   Barriers to discharge        Co-evaluation               AM-PAC PT "6 Clicks" Daily Activity  Outcome Measure Difficulty turning over in bed (including adjusting bedclothes, sheets and blankets)?: Unable Difficulty moving from lying on back to sitting on the side of the bed? : Unable Difficulty sitting down on and standing up from a chair with arms (e.g., wheelchair, bedside commode, etc,.)?: Unable Help needed moving to and from a bed to chair (including a wheelchair)?: A Lot Help needed walking in hospital room?: A Lot Help needed climbing 3-5 steps with a railing? : Total 6 Click Score: 8    End of Session Equipment Utilized During Treatment: Gait belt;Oxygen Activity Tolerance: Patient tolerated treatment well Patient left: in bed;with call bell/phone within reach;with nursing/sitter in room Nurse Communication: Mobility status PT Visit Diagnosis: Other abnormalities of gait and mobility (R26.89);Other symptoms and signs involving the nervous system (R29.898)    Time: 9798-9211 PT Time Calculation (min) (ACUTE ONLY): 23 min   Charges:   PT Evaluation $PT Eval High Complexity: 1 High PT Treatments $Therapeutic Activity: 8-22 mins  PT G Codes:       Mabeline Caras, PT, DPT Acute Rehab Services  Pager: [place] 09/16/2017, 12:39 PM

## 2017-09-16 NOTE — Plan of Care (Signed)
  No Outcome SLP Dysphagia Goals Patient will demonstrate readiness for PO's Description Patient will demonstrate readiness for PO's and/or instrumental swallow study as evidenced by: 09/16/2017 1115 by Colon Flattery B, [place] Taken 09/16/2017 1115  Patient will demonstrate readiness for PO's and/or instrumental swallow study as evidenced by sustained attention to PO trials;with min assist

## 2017-09-17 LAB — CBC
HCT: 35.9 % — ABNORMAL LOW (ref 39.0–52.0)
HEMOGLOBIN: 11.1 g/dL — AB (ref 13.0–17.0)
MCH: 26.3 pg (ref 26.0–34.0)
MCHC: 30.9 g/dL (ref 30.0–36.0)
MCV: 85.1 fL (ref 78.0–100.0)
PLATELETS: 143 10*3/uL — AB (ref 150–400)
RBC: 4.22 MIL/uL (ref 4.22–5.81)
RDW: 17.9 % — ABNORMAL HIGH (ref 11.5–15.5)
WBC: 11.8 10*3/uL — ABNORMAL HIGH (ref 4.0–10.5)

## 2017-09-17 LAB — GLUCOSE, CAPILLARY
GLUCOSE-CAPILLARY: 166 mg/dL — AB (ref 65–99)
GLUCOSE-CAPILLARY: 175 mg/dL — AB (ref 65–99)
GLUCOSE-CAPILLARY: 257 mg/dL — AB (ref 65–99)
Glucose-Capillary: 149 mg/dL — ABNORMAL HIGH (ref 65–99)
Glucose-Capillary: 179 mg/dL — ABNORMAL HIGH (ref 65–99)
Glucose-Capillary: 254 mg/dL — ABNORMAL HIGH (ref 65–99)

## 2017-09-17 LAB — BASIC METABOLIC PANEL
ANION GAP: 12 (ref 5–15)
BUN: 82 mg/dL — ABNORMAL HIGH (ref 6–20)
CALCIUM: 8 mg/dL — AB (ref 8.9–10.3)
CO2: 25 mmol/L (ref 22–32)
Chloride: 104 mmol/L (ref 101–111)
Creatinine, Ser: 2.58 mg/dL — ABNORMAL HIGH (ref 0.61–1.24)
GFR calc Af Amer: 29 mL/min — ABNORMAL LOW (ref >60)
GFR, EST NON AFRICAN AMERICAN: 25 mL/min — AB (ref >60)
GLUCOSE: 370 mg/dL — AB (ref 65–99)
Potassium: 3.2 mmol/L — ABNORMAL LOW (ref 3.5–5.1)
SODIUM: 141 mmol/L (ref 135–145)

## 2017-09-17 LAB — COMPREHENSIVE METABOLIC PANEL
ALT: 59 U/L (ref 17–63)
AST: 35 U/L (ref 15–41)
Albumin: 1.8 g/dL — ABNORMAL LOW (ref 3.5–5.0)
Alkaline Phosphatase: 271 U/L — ABNORMAL HIGH (ref 38–126)
Anion gap: 12 (ref 5–15)
BUN: 80 mg/dL — AB (ref 6–20)
CHLORIDE: 102 mmol/L (ref 101–111)
CO2: 26 mmol/L (ref 22–32)
CREATININE: 2.73 mg/dL — AB (ref 0.61–1.24)
Calcium: 8 mg/dL — ABNORMAL LOW (ref 8.9–10.3)
GFR calc Af Amer: 27 mL/min — ABNORMAL LOW (ref >60)
GFR, EST NON AFRICAN AMERICAN: 23 mL/min — AB (ref >60)
Glucose, Bld: 425 mg/dL — ABNORMAL HIGH (ref 65–99)
Potassium: 3.1 mmol/L — ABNORMAL LOW (ref 3.5–5.1)
Sodium: 140 mmol/L (ref 135–145)
Total Bilirubin: 1.3 mg/dL — ABNORMAL HIGH (ref 0.3–1.2)
Total Protein: 5.4 g/dL — ABNORMAL LOW (ref 6.5–8.1)

## 2017-09-17 LAB — CULTURE, BLOOD (ROUTINE X 2)
CULTURE: NO GROWTH
CULTURE: NO GROWTH
SPECIAL REQUESTS: ADEQUATE
SPECIAL REQUESTS: ADEQUATE

## 2017-09-17 LAB — MAGNESIUM: Magnesium: 2.2 mg/dL (ref 1.7–2.4)

## 2017-09-17 LAB — PHOSPHORUS: Phosphorus: 4.6 mg/dL (ref 2.5–4.6)

## 2017-09-17 MED ORDER — PRO-STAT SUGAR FREE PO LIQD
30.0000 mL | Freq: Two times a day (BID) | ORAL | Status: DC
  Administered 2017-09-17: 30 mL
  Filled 2017-09-17: qty 30

## 2017-09-17 MED ORDER — POTASSIUM CHLORIDE 10 MEQ/50ML IV SOLN
10.0000 meq | INTRAVENOUS | Status: AC
  Administered 2017-09-17 (×4): 10 meq via INTRAVENOUS
  Filled 2017-09-17 (×4): qty 50

## 2017-09-17 MED ORDER — AMIODARONE HCL 200 MG PO TABS: 200.0000 mg | ORAL_TABLET | Freq: Every day | ORAL | Status: DC

## 2017-09-17 MED ORDER — POTASSIUM CHLORIDE 10 MEQ/50ML IV SOLN
10.0000 meq | INTRAVENOUS | Status: AC
  Administered 2017-09-17 – 2017-09-18 (×4): 10 meq via INTRAVENOUS
  Filled 2017-09-17 (×4): qty 50

## 2017-09-17 MED ORDER — POTASSIUM CHLORIDE 20 MEQ/15ML (10%) PO SOLN: 30.0000 meq | ORAL | Status: DC

## 2017-09-17 MED ORDER — INSULIN ASPART 100 UNIT/ML ~~LOC~~ SOLN
5.0000 [IU] | SUBCUTANEOUS | Status: DC
  Administered 2017-09-17 – 2017-09-23 (×34): 5 [IU] via SUBCUTANEOUS

## 2017-09-17 MED ORDER — VITAL HIGH PROTEIN PO LIQD
1000.0000 mL | ORAL | Status: DC
  Administered 2017-09-18 – 2017-09-23 (×7): 1000 mL

## 2017-09-17 MED ORDER — AMIODARONE HCL 200 MG PO TABS
200.0000 mg | ORAL_TABLET | Freq: Two times a day (BID) | ORAL | Status: DC
Start: 2017-09-17 — End: 2017-09-23
  Administered 2017-09-17 – 2017-09-23 (×13): 200 mg
  Filled 2017-09-17 (×13): qty 1

## 2017-09-17 MED ORDER — GERHARDT'S BUTT CREAM
TOPICAL_CREAM | Freq: Four times a day (QID) | CUTANEOUS | Status: DC | PRN
Start: 2017-09-17 — End: 2017-09-23
  Administered 2017-09-18 – 2017-09-23 (×7): via TOPICAL
  Filled 2017-09-17 (×3): qty 1

## 2017-09-17 MED ORDER — VITAL HIGH PROTEIN PO LIQD
1000.0000 mL | ORAL | Status: DC
  Administered 2017-09-17: 1000 mL

## 2017-09-17 MED ORDER — DOXAZOSIN MESYLATE 1 MG PO TABS
1.0000 mg | ORAL_TABLET | Freq: Every day | ORAL | Status: DC
  Administered 2017-09-17 – 2017-09-23 (×7): 1 mg
  Filled 2017-09-17 (×7): qty 1

## 2017-09-17 NOTE — Progress Notes (Signed)
ICD interrogation reviewed by Dr. Curt Bears with [facility] representative who saw/checked device on arrival.  AV optimization was done in the ER, no other reprogramming, patient initial rhythm was VT about 150bpm in a monitor zone. VF occurred was appropriately detected and successfully converted with one shock.  I do not see discussion of any sustained VT events since his initial episode, telemetry is reviewed available tracings note only a couple NSVT.    Would not make any further device adjustments while here, once patient is ready for discharge, recall EP to revisit this.    Tommye Standard, PA-C  Allegra Lai, MD

## 2017-09-17 NOTE — Progress Notes (Addendum)
Cardiology follow-up  Extensive chart review.  Spoke with critical care team this morning.  Would be appropriate to switch amiodarone to 400 mg daily per NG for 7-10 days then 200 mg daily thereafter.  Discontinue IV amiodarone.  Patient moves without purpose.  Does not respond to verbal stimuli.  Paced rhythm on monitor.  No ventricular ectopy or atrial fibrillation is noted recently.  Please call if further questions.

## 2017-09-17 NOTE — Progress Notes (Signed)
PULMONARY / CRITICAL CARE MEDICINE   Name: Mark Martin MRN: [id number] DOB: [birth year]    ADMISSION DATE:  09/05/2017   REFERRING MD: Emergency department physician  CHIEF COMPLAINT: Cardiac arrest  BRIEF  Mark Martin is a 62 year old male with known ischemic cardiomyopathy, ejection fraction less than 20%, with an AICD in place.  Was in his usual state of health until 09/05/2017 when he had witnessed arrest.  Family started CPR EMS arrived and ROSC within 25 minutes with 1 dose of epinephrine.  His AICD was interrogated and showed 25 minutes of VT in PEA.  His AICD did shock him x1. He is transferred to [facility] emergency department where he was intubated and started on epinephrine drip.  He continued to have multiple episodes of PEA requiring increasing doses of epinephrine. PCCM called for admission and hypothermia protocol.     STUDIES:  09/05/2017 2D echo >>>EF 15%, possible early forming thrombus, severe diffuse hypokinesis, grade 2 diastolic dysfunction, PA peak pressure 60mmHg 09/05/2017 CT of the head >>>neg acute  1/15 EEG>>> diffuse slowing; non specific etiology patter. No epileptiform discharge. 1/19: CT abd/pelvis 1. Marked distension of the urinary bladder measuring approximately 1370 cc. There is bilateral pelvocaliectasis and mild hydroureter. Correlate for any clinical signs or symptoms of bladder outlet obstruction. 2. Mild prostate gland enlargement. 3. Bilateral pleural effusions and overlying areas of atelectasis and airspace consolidation. Cannot rule out pneumonia. 4.  Aortic Atherosclerosis (ICD10-I70.0). 5. Gallstone identified within the neck of gallbladder. Renal US 1/20: Mild hydronephrosis on the left.  No hydronephrosis on the right. CULTURES: Blood 1/12>>>NTD Procal 7.14 Sputum 1/12>>> normal flora  Urine culture 117 2019-negative 09/12/2017 blood cultures x2>> 09/12/2016 sputum culture>>neg  Blood ANTIBIOTICS: Rocephin 1/12>>>  09/13/2017  Zosyn>> 09/13/2017 vancomycin>>1/21  LINES/TUBES: 09/05/2017 ETT>> 09/05/2017 R fem CVL>>> out 09/05/2017 right femoral aline>>> out Right double-lumen PICC basilic vein 01/22/4131>> 09/14/2017 Foley catheter placed for bladder obstruction>>   SIGNIFICANT EVENTS: 09/05/2017 VT PEA arrest 09/08/17 - Pressor demand decreasing, no events overnight 09/09/17 - worsening AKI. Family at beside. On vent. Daughter at bedside and wife report less responsiveness x 48h/ Last prn sedation of fent/versed on 09/06/17 - per RN moves all 4s, non purposeful and weaning on SBT. Off pressors. Has femoral lines 1/16 - No acute change overnight.  Tolerating pressure support wean 5/5.  Looks good from respiratory standpoint.  More awake but not following commands. 1/18- Weaning this am on 30%, 5/5, pulling good volumes with prn fentanyl and versed. MAE x 4, but does not follow commands 09/14/2017 intermittently follow commands. 09/14/2017 was found to have bladder obstruction on CT of the abdomen.  Foley was placed.  SUBJECTIVE/OVERNIGHT/INTERVAL HX Appears comfortable on vent, does have bleeding overnight orally  VITAL SIGNS: BP (!) 88/62   Pulse 60   Temp 97.6 F (36.4 C) (Axillary)   Resp 14   Ht 5\' 6"  (1.676 m)   Wt 167 lb 1.7 oz (75.8 kg)   SpO2 100%   BMI 26.97 kg/m   HEMODYNAMICS:    VENTILATOR SETTINGS: Vent Mode: PRVC FiO2 (%):  [40 %-100 %] 40 % Set Rate:  [14 bmp-15 bmp] 14 bmp Vt Set:  [500 mL] 500 mL PEEP:  [5 cmH20] 5 cmH20 Plateau Pressure:  [18 cmH20-21 cmH20] 18 cmH20  INTAKE / OUTPUT:  Intake/Output Summary (Last 24 hours) at 09/17/2017 4401 Last data filed at 09/17/2017 0700 Gross per 24 hour  Intake 1995.6 ml  Output 2035 ml  Net -39.4 ml  PHYSICAL EXAMINATION:  General: 62 year old male patient currently appears comfortable on the vent HEENT: Orally intubated, poor dentition, no JVD, does have bloody oral secretions Pulmonary: Scattered rhonchi, no accessory use equal  chest rise Cardiac: Systolic murmur noted.  Regular rate and rhythm Abdomen: Soft nontender no organomegaly Extremities: Chronic lower extremity edema, brisk cap refill, warm. Neuro/psych.  Opens eyes, localizes, no focal deficits.  PULMONARY Recent Labs  Lab 09/15/17 1200 09/15/17 2125 09/16/17 2228  PHART  --  7.453* 7.451*  PCO2ART  --  41.5 46.3  PO2ART  --  111* 250.0*  HCO3  --  28.6* 32.3*  TCO2  --   --  34*  O2SAT 58.1 98.0 100.0    CBC Recent Labs  Lab 09/15/17 0339 09/16/17 0518 09/17/17 0243  HGB 12.7* 13.5 11.1*  HCT 39.8 42.4 35.9*  WBC 14.8* 20.3* 11.8*  PLT 128* 175 143*    COAGULATION No results for input(s): INR in the last 168 hours.  CARDIAC   No results for input(s): TROPONINI in the last 168 hours. No results for input(s): PROBNP in the last 168 hours.   CHEMISTRY Recent Labs  Lab 09/11/17 0245 09/12/17 0250 09/13/17 0418  09/14/17 0457 09/15/17 0339 09/15/17 2255 09/16/17 0518 09/17/17 0243  NA 143 148*  --    < > 161* 148* 145 148* 140  K 3.1* 3.4*  --    < > 3.4* 3.2* 3.7 4.2 3.1*  CL 98* 102  --    < > 111 106 105 107 102  CO2 28 30  --    < > 31 28 27 26 26   GLUCOSE 225* 216*  --    < > 192* 240* 205* 214* 425*  BUN 127* 133*  --    < > 143* 121* 111* 106* 80*  CREATININE 3.07* 2.89*  --    < > 3.19* 3.00* 2.90* 2.86* 2.73*  CALCIUM 8.5* 8.5*  --    < > 8.7* 8.2* 8.2* 8.4* 8.0*  MG 2.3 2.1 2.1  --   --  2.2 2.1 2.1  --   PHOS 4.8* 4.0 4.0  --   --  5.3*  --  5.0*  --    < > = values in this interval not displayed.   Estimated Creatinine Clearance: 25 mL/min (A) (by C-G formula based on SCr of 2.73 mg/dL (H)).   LIVER Recent Labs  Lab 09/13/17 1100 09/17/17 0243  AST 67* 35  ALT 161* 59  ALKPHOS 392* 271*  BILITOT 1.6* 1.3*  PROT 5.9* 5.4*  ALBUMIN 2.3* 1.8*     INFECTIOUS Recent Labs  Lab 09/12/17 1021 09/13/17 0418 09/14/17 0457 09/15/17 0339  LATICACIDVEN  --   --   --  1.4  PROCALCITON 5.73 5.71  6.36  --      ENDOCRINE CBG (last 3)  Recent Labs    09/16/17 2343 09/17/17 0334 09/17/17 0728  GLUCAP 147* 166* 149*     IMAGING x48h  - image(s) personally visualized  -   highlighted in bold US Renal  Result Date: 09/15/2017 CLINICAL DATA:  Follow-up hydronephrosis. EXAM: RENAL / URINARY TRACT ULTRASOUND COMPLETE COMPARISON:  CT scan September 14, 2017 FINDINGS: Right Kidney: Length: 10.9 cm.  No hydronephrosis. Left Kidney: Length: 11.4 cm.  Mild hydronephrosis. Bladder: Decompressed with a catheter. IMPRESSION: Mild hydronephrosis on the left.  No hydronephrosis on the right. Electronically Signed   By: Dorise Bullion III M.D   On: 09/15/2017 20:32  Dg Chest Port 1 View  Result Date: 09/16/2017 CLINICAL DATA:  62 year old male status post re-intubation EXAM: PORTABLE CHEST 1 VIEW COMPARISON:  Prior chest x-ray obtained earlier today FINDINGS: The patient has been intubated. The tip of the endotracheal tube is 4 cm above the carina. A gastric tube is been placed. The tip of the tube overlies the GE junction. Stable cardiomegaly. Patient is status post median sternotomy with evidence of multivessel CABG including LIMA bypass. Left subclavian approach biventricular cardiac rhythm maintenance device. The leads remain in unchanged position. Right upper extremity PIC Catheter tip overlies the cavoatrial junction. Pulmonary vascular congestion bordering on mild interstitial edema. Persistent left basilar patchy airspace opacity. IMPRESSION: 1. The tip of the endotracheal tube is 4 cm above the carina. 2. The tip of the gastric tube overlies the GE junction. Recommend advancing for more optimal placement in the stomach. 3. Persistent left basilar airspace opacity likely representing a combination of pleural fluid and atelectasis. 4. Cardiomegaly and pulmonary vascular congestion bordering on mild edema. Electronically Signed   By: Jacqulynn Cadet M.D.   On: 09/16/2017 16:11   Dg Chest Port 1  View  Result Date: 09/16/2017 CLINICAL DATA:  62 year old male with history of respiratory distress. EXAM: PORTABLE CHEST 1 VIEW COMPARISON:  Chest x-ray 09/15/2017. FINDINGS: Previously noted endotracheal tube and nasogastric tube have both been removed. There is a right upper extremity PICC with tip terminating in the superior cavoatrial junction. Left-sided biventricular pacemaker/AICD in place with lead tips projecting over the expected location of the right atrium, right ventricular apex and lateral wall of the left ventricle via the coronary sinus and coronary veins. Lung volumes are low. Patchy multifocal interstitial and airspace disease. Small left pleural effusion. Cephalization of the pulmonary vasculature. Mild cardiomegaly. Upper mediastinal contours are distorted by patient's rotation to the right. Aortic atherosclerosis. IMPRESSION: 1. Support apparatus and postoperative changes, as above. 2. The appearance of the chest is most compatible with mild to moderate congestive heart failure. The possibility of superimposed pneumonia is not excluded, but not strongly favored. Electronically Signed   By: Vinnie Langton M.D.   On: 09/16/2017 07:39    ASSESSMENT / PLAN:  s/p VT/VF arrest in setting of NSTEMI Ischemic cardiomyopathy - EF 15%, AICD in place  Cardiogenic shock (resolved) Atrial fib  plan Cont asa Dc brillinta (given planned trach-->will plan for Friday) Start plavix in place of Brillinta after trach Transition from IV amio to VT amio: 200mg  bid x 7-10d; then down to 200mg /d (after 1/29) Assess for BB Hold ARB/ace-I d/t renal dysfxn Hold lasix again today  Holding heparin    Anoxic encephalopathy status post PEA arrest w/ ICU related physical  deconditioning  -has made some improvement Plan Supportive care Minimize sedation  OOB Routine delirium interventions   Dysphagia  Plan NPO  Start tubefeeds  acute respiratory failure s/p cardiac arrest & now vent  dependent d/t inability to protect airway  Reintubated on 1/21 due to ineffective airway protection.  Will need trach Plan Continue full ventilator support PAD protocol with RASS goal 0 Planning on trach Friday 25th to allow washout of brillinta   AKI: initial insult:  cardiac arrest but then complicated by diuresis and Urinary retention w/ MIld right hydronephrosis   on 1/19 w/ MIld Hydronephrosis identified via f/c on CT of the abdomen.  -f/c placed -scr improved some w/ IVFs Plan Urology called  Renal dose meds Add doxyzosin    Fluid and electrolyte imbalance: hypernatremia & hypokalemia  Plan Cont free water replacement  F/u am chemistry  Replace potassium  Elevated LFTs Plan Trend  Fever w/ leukocytosis on 1/17 with elevated pro-calcitonin >source unclear UC neg, BC neg sputum neg Plan Day 5/7 zosyn   Consulted urology for obstructive uropathy   Hyperglycemia  Plan Ssi, adding basal dosing  FAMILY  - Updates: No family at bedside 09/15/2017  - Inter-disciplinary family meet or Palliative Care meeting due by:  day 7   My cct 40 min  Erick Colace ACNP-BC [place] Pager # [phone] OR # [phone] if no answer  Attending Note:  62 year old male presenting after a cardiac arrest that was reintubated 1/21 due to inability to protect his airway.  Had extended discussions with family yesterday who now report that they would like to proceed with trach/peg.  On exam, awake and following some commands with coarse BS.  I reviewed CXR myself, ETT is in good position.  Discussed with cards.  Ok to hold brillinta and will plan to proceed with trach on Friday.  Stop date on zosyn established.  CBGs addressed.  Continue PS efforts but no extubation til able to trach on Friday.  The patient is critically ill with multiple organ systems failure and requires high complexity decision making for assessment and support, frequent evaluation and titration of  therapies, application of advanced monitoring technologies and extensive interpretation of multiple databases.   Critical Care Time devoted to patient care services described in this note is  35  Minutes. This time reflects time of care of this signee Dr [name]. This critical care time does not reflect procedure time, or teaching time or supervisory time of PA/NP/Med student/Med Resident etc but could involve care discussion time.  Rush Farmer, M.D. [facility] Pulmonary/Critical Care Medicine. Pager: [phone]. After hours pager: [phone].

## 2017-09-17 NOTE — Progress Notes (Signed)
Nutrition Follow-up  DOCUMENTATION CODES:   Not applicable  INTERVENTION:   Tube Feeding:  Vital High Protein @ 70 ml/hr Provides 1680 kcals, 148 g of protein and 1411 mL of free water Meets 100% protein needs, 101% calorie needs  NUTRITION DIAGNOSIS:   Inadequate oral intake related to inability to eat as evidenced by NPO status.  Being addressed via TF  GOAL:   Provide needs based on ASPEN/SCCM guidelines  Progressing  MONITOR:   Vent status, Labs, Weight trends, TF tolerance, Skin, I & O's  REASON FOR ASSESSMENT:   Consult Enteral/tube feeding initiation and management  ASSESSMENT:   Pt with PMH of HTN, DM, ischemic cardiomyopathy, ejection fraction less than 20%, with an AICD in place admitted after PEA cardiac arrest.   1/19 CT abdomen with bladder obstruction 1/20 Extubated 1/21 Re-Intubated, noted plan to proceed with trach 1/22 Adult Tube Feeding Protocol initiated  Patient is currently intubated on ventilator support MV: 6.7 L/min Temp (24hrs), Avg:98.2 F (36.8 C), Min:97 F (36.1 C), Max:99.2 F (37.3 C)  No nutrition since 1/18 OG tube in place, abdomen soft, BS active, +stool  Weight down significantly since admission. Current wt 167 pounds, re-weighed on visit to confirm. Weight on admission 197 pounds. Pt net negative on 1 L since admission per I/O flow sheet  Labs: potassium 3.1 (supplemented), BUN 80, Creatinine 2.73, sodium wdl Meds: D5 @ 40 ml/hr, fentanyl, versed  Diet Order:  No diet orders on file  EDUCATION NEEDS:   No education needs have been identified at this time  Skin:  Skin Assessment: Skin Integrity Issues: Skin Integrity Issues:: Other (Comment) Other: open wound on arm, MASD   Last BM:  1/21 rectal tube  Height:   Ht Readings from Last 1 Encounters:  09/05/17 5\' 6"  (1.676 m)    Weight:   Wt Readings from Last 1 Encounters:  09/17/17 167 lb 1.7 oz (75.8 kg)    Ideal Body Weight:  64.5 kg  BMI:  Body  mass index is 26.97 kg/m.  Estimated Nutritional Needs:   Kcal:  1660 kcals  Protein:  114-152 g  Fluid:  > 1.5 L/day  Kerman Passey MS, RD, LDN, CNSC [phone] Pager  [phone] Weekend/On-Call Pager

## 2017-09-17 NOTE — Progress Notes (Signed)
OT Cancellation Note and Discharge  Patient Details Name: Mark Martin MRN: [id number] DOB: [birth year]   Cancelled Treatment:    Reason Eval/Treat Not Completed: Patient not medically ready. Pt now intubated and not appropriate for OT eval at this time. We will D/C from acute OT--please re-order as appropriate.  Almon Register [id number] 09/17/2017, 8:24 AM

## 2017-09-17 NOTE — Progress Notes (Signed)
[facility] Progress Note Patient Name: Mark Martin DOB: 08/07/1956 MRN: [id number]   Date of Service  09/17/2017  HPI/Events of Note  K+ = 3.1 and Creatinine = 2.73.   eICU Interventions  Will replace K+.      Intervention Category Major Interventions: Electrolyte abnormality - evaluation and management  Casandra Dallaire Eugene 09/17/2017, 5:55 AM

## 2017-09-17 NOTE — Progress Notes (Signed)
CSW contacted by MD to come speak with patient family concerning placement if patient is trach dependent.   CSW explained Medicare and Medicare rules for rehab stay as well as need for Medicaid if patient needing to stay long term.  Family believes that patient already has Medicaid and plan to find his card and bring to the hospital.  They state the patient got disability benefits back in 2006-2007 and has had them ever since.  CSW will not follow at this time but provided family with card in case of further questions  Jorge Ny, [place] Social Worker [phone]

## 2017-09-17 NOTE — Care Management Note (Addendum)
Case Management Note  Patient Details  Name: Mark Martin MRN: [id number] Date of Birth: [birth year]  Subjective/Objective:  From home, VDRF secondary to cardiac arrest, worsening aki, on pressure support, has AMS, has EF less than 20% with AICD.  Conts on amio, lasix iv, rocephin, and heparin drip.   1/16 [place], BSN- resp status improving with ongoing diuresis, plan to extubate patient this afternoon when family gets here.     1/17 [place], BSN - encephalapathy is barrier to extubation,will start precedex and extubate if he does well per MD note, conts on high dose lasix, heparin drip, and rocephin.     1/21 [place] RN,BSN- patient was extubated on 1/20, he is not able to clear his airway at all, per MD he will need to have meeting with family in am to discuss re tube and trach, on cxr showed infiltrate.    1/22 [place], BSN- Discussed in LOS.  Patient will be a LTACH candidate after trach placed this week awaiting brilinta washout.  [facility] Psychologist, educational is aware and [facility] liason is aware.NCM spoke with patient's brother Mark Martin and patient's wife.  Informed them that both liason will be here to speak with them about [facility] and [facility]. Brother will let NCM know which LTACH he chooses.     1/23 [place], BSN- NCM left message for brother , Mark Martin to call NCM regarding who he chose. Awaiting call back .  12/25 [place], BSN - Mark Martin , patient's brother chose [facility], NCM informed both liason [place] and [place].                                 Action/Plan: Patient will get trach in next couple of days and then plan for LTACH.  NCM will follow for dc needs.   Expected Discharge Date:                  Expected Discharge Plan:  Long Term Acute Care (LTAC)  In-House Referral:     Discharge planning Services  CM Consult  Post Acute Care Choice:    Choice offered to:     DME Arranged:    DME Agency:      HH Arranged:    HH Agency:     Status of Service:  In process, will continue to follow  If discussed at Long Length of Stay Meetings, dates discussed:    Additional Comments:  Mark Mayo, RN 09/17/2017, 4:16 PM

## 2017-09-18 LAB — CBC
HCT: 37.1 % — ABNORMAL LOW (ref 39.0–52.0)
Hemoglobin: 11.9 g/dL — ABNORMAL LOW (ref 13.0–17.0)
MCH: 26.8 pg (ref 26.0–34.0)
MCHC: 32.1 g/dL (ref 30.0–36.0)
MCV: 83.6 fL (ref 78.0–100.0)
PLATELETS: 186 10*3/uL (ref 150–400)
RBC: 4.44 MIL/uL (ref 4.22–5.81)
RDW: 17.7 % — AB (ref 11.5–15.5)
WBC: 15.4 10*3/uL — ABNORMAL HIGH (ref 4.0–10.5)

## 2017-09-18 LAB — GLUCOSE, CAPILLARY
GLUCOSE-CAPILLARY: 131 mg/dL — AB (ref 65–99)
GLUCOSE-CAPILLARY: 162 mg/dL — AB (ref 65–99)
Glucose-Capillary: 106 mg/dL — ABNORMAL HIGH (ref 65–99)
Glucose-Capillary: 154 mg/dL — ABNORMAL HIGH (ref 65–99)
Glucose-Capillary: 173 mg/dL — ABNORMAL HIGH (ref 65–99)
Glucose-Capillary: 175 mg/dL — ABNORMAL HIGH (ref 65–99)

## 2017-09-18 LAB — COMPREHENSIVE METABOLIC PANEL
ALT: 63 U/L (ref 17–63)
ANION GAP: 12 (ref 5–15)
AST: 53 U/L — ABNORMAL HIGH (ref 15–41)
Albumin: 1.9 g/dL — ABNORMAL LOW (ref 3.5–5.0)
Alkaline Phosphatase: 391 U/L — ABNORMAL HIGH (ref 38–126)
BUN: 78 mg/dL — ABNORMAL HIGH (ref 6–20)
CHLORIDE: 110 mmol/L (ref 101–111)
CO2: 26 mmol/L (ref 22–32)
Calcium: 8.4 mg/dL — ABNORMAL LOW (ref 8.9–10.3)
Creatinine, Ser: 2.67 mg/dL — ABNORMAL HIGH (ref 0.61–1.24)
GFR calc non Af Amer: 24 mL/min — ABNORMAL LOW (ref >60)
GFR, EST AFRICAN AMERICAN: 28 mL/min — AB (ref >60)
Glucose, Bld: 139 mg/dL — ABNORMAL HIGH (ref 65–99)
Potassium: 3.9 mmol/L (ref 3.5–5.1)
SODIUM: 148 mmol/L — AB (ref 135–145)
Total Bilirubin: 1.1 mg/dL (ref 0.3–1.2)
Total Protein: 5.7 g/dL — ABNORMAL LOW (ref 6.5–8.1)

## 2017-09-18 LAB — MAGNESIUM
MAGNESIUM: 2.2 mg/dL (ref 1.7–2.4)
MAGNESIUM: 2.2 mg/dL (ref 1.7–2.4)

## 2017-09-18 LAB — PHOSPHORUS
PHOSPHORUS: 3.9 mg/dL (ref 2.5–4.6)
Phosphorus: 3.5 mg/dL (ref 2.5–4.6)

## 2017-09-18 MED ORDER — FENTANYL CITRATE (PF) 100 MCG/2ML IJ SOLN
200.0000 ug | Freq: Once | INTRAMUSCULAR | Status: AC
  Administered 2017-09-19: 100 ug via INTRAVENOUS
  Filled 2017-09-18: qty 4

## 2017-09-18 MED ORDER — VECURONIUM BROMIDE 10 MG IV SOLR: 10.0000 mg | Freq: Once | INTRAVENOUS | Status: DC

## 2017-09-18 MED ORDER — ETOMIDATE 2 MG/ML IV SOLN
40.0000 mg | Freq: Once | INTRAVENOUS | Status: AC
  Administered 2017-09-19: 20 mg via INTRAVENOUS
  Filled 2017-09-18: qty 20

## 2017-09-18 MED ORDER — MIDAZOLAM HCL 2 MG/2ML IJ SOLN: 4.0000 mg | Freq: Once | INTRAMUSCULAR | Status: DC

## 2017-09-18 MED ORDER — FENTANYL CITRATE (PF) 100 MCG/2ML IJ SOLN: 200.0000 ug | Freq: Once | INTRAMUSCULAR | Status: DC

## 2017-09-18 MED ORDER — MIDAZOLAM HCL 2 MG/2ML IJ SOLN
4.0000 mg | Freq: Once | INTRAMUSCULAR | Status: AC
  Administered 2017-09-19: 2 mg via INTRAVENOUS
  Filled 2017-09-18: qty 4

## 2017-09-18 MED ORDER — PROPOFOL 500 MG/50ML IV EMUL: 5.0000 ug/kg/min | Freq: Once | INTRAVENOUS | Status: DC

## 2017-09-18 MED ORDER — ETOMIDATE 2 MG/ML IV SOLN: 40.0000 mg | Freq: Once | INTRAVENOUS | Status: DC

## 2017-09-18 MED ORDER — VECURONIUM BROMIDE 10 MG IV SOLR
10.0000 mg | Freq: Once | INTRAVENOUS | Status: DC
  Filled 2017-09-18: qty 10

## 2017-09-18 MED ORDER — FREE WATER
250.0000 mL | Freq: Four times a day (QID) | Status: DC
  Administered 2017-09-18 – 2017-09-22 (×13): 250 mL

## 2017-09-18 NOTE — Progress Notes (Signed)
PULMONARY / CRITICAL CARE MEDICINE   Name: Mark Martin MRN: [id number] DOB: [birth year]    ADMISSION DATE:  09/05/2017   REFERRING MD: Emergency department physician  CHIEF COMPLAINT: Cardiac arrest  BRIEF  Mark Martin is a 62 year old male with known ischemic cardiomyopathy, ejection fraction less than 20%, with an AICD in place.  Was in his usual state of health until 09/05/2017 when he had witnessed arrest.  Family started CPR EMS arrived and ROSC within 25 minutes with 1 dose of epinephrine.  His AICD was interrogated and showed 25 minutes of VT in PEA.  His AICD did shock him x1. He is transferred to [facility] emergency department where he was intubated and started on epinephrine drip.  He continued to have multiple episodes of PEA requiring increasing doses of epinephrine. PCCM called for admission and hypothermia protocol.     STUDIES:  09/05/2017 2D echo >>>EF 15%, possible early forming thrombus, severe diffuse hypokinesis, grade 2 diastolic dysfunction, PA peak pressure 38mmHg 09/05/2017 CT of the head >>>neg acute  1/15 EEG>>> diffuse slowing; non specific etiology patter. No epileptiform discharge. 1/19: CT abd/pelvis 1. Marked distension of the urinary bladder measuring approximately 1370 cc. There is bilateral pelvocaliectasis and mild hydroureter. Correlate for any clinical signs or symptoms of bladder outlet obstruction. 2. Mild prostate gland enlargement. 3. Bilateral pleural effusions and overlying areas of atelectasis and airspace consolidation. Cannot rule out pneumonia. 4.  Aortic Atherosclerosis (ICD10-I70.0). 5. Gallstone identified within the neck of gallbladder. Renal US 1/20: Mild hydronephrosis on the left.  No hydronephrosis on the right.  CULTURES: Blood 1/12>>>NTD Procal 7.14 Sputum 1/12>>> normal flora  Urine culture 117 2019-negative 09/12/2017 blood cultures x2>> 09/12/2016 sputum culture>>neg  Blood ANTIBIOTICS: Rocephin 1/12>>>  09/13/2017  Zosyn>>1/24 09/13/2017 vancomycin>>1/21  LINES/TUBES: 09/05/2017 ETT>> 09/05/2017 R fem CVL>>> out 09/05/2017 right femoral aline>>> out Right double-lumen PICC basilic vein 6/57/8469>> 09/14/2017 Foley catheter placed for bladder obstruction>>  SIGNIFICANT EVENTS: 09/05/2017 VT PEA arrest 09/08/17 - Pressor demand decreasing, no events overnight 09/09/17 - worsening AKI. Family at beside. On vent. Daughter at bedside and wife report less responsiveness x 48h/ Last prn sedation of fent/versed on 09/06/17 - per RN moves all 4s, non purposeful and weaning on SBT. Off pressors. Has femoral lines 1/16 - No acute change overnight.  Tolerating pressure support wean 5/5.  Looks good from respiratory standpoint.  More awake but not following commands. 1/18- Weaning this am on 30%, 5/5, pulling good volumes with prn fentanyl and versed. MAE x 4, but does not follow commands 09/14/2017 intermittently follow commands. 09/14/2017 was found to have bladder obstruction on CT of the abdomen.  Foley was placed.  SUBJECTIVE/OVERNIGHT/INTERVAL HX No issues overnight, hyperglycemia  VITAL SIGNS: BP 107/76   Pulse 77   Temp 98.8 F (37.1 C)   Resp (!) 21   Ht 5\' 6"  (1.676 m)   Wt 78.1 kg (172 lb 2.9 oz)   SpO2 98%   BMI 27.79 kg/m   HEMODYNAMICS:    VENTILATOR SETTINGS: Vent Mode: PRVC FiO2 (%):  [30 %-40 %] 30 % Set Rate:  [14 bmp] 14 bmp Vt Set:  [500 mL] 500 mL PEEP:  [5 cmH20] 5 cmH20 Pressure Support:  [10 cmH20] 10 cmH20 Plateau Pressure:  [14 cmH20-20 cmH20] 14 cmH20  INTAKE / OUTPUT:  Intake/Output Summary (Last 24 hours) at 09/18/2017 0809 Last data filed at 09/18/2017 0700 Gross per 24 hour  Intake 2357.18 ml  Output 2015 ml  Net 342.18 ml  PHYSICAL EXAMINATION:  General: 62 year old male patient currently appears comfortable on the vent HEENT: [place]/AT, PERRL, EOM-I and MMM Pulmonary: Scattered rhonchi, no accessory use equal chest rise Cardiac: RRR, Nl S1/S2 and  -M/R/G. Abdomen: Soft, NT, ND and +BS Extremities: Chronic lower extremity edema, brisk cap refill, warm. Neuro/psych.  Opens eyes, localizes, no focal deficits.  PULMONARY Recent Labs  Lab 09/15/17 1200 09/15/17 2125 09/16/17 2228  PHART  --  7.453* 7.451*  PCO2ART  --  41.5 46.3  PO2ART  --  111* 250.0*  HCO3  --  28.6* 32.3*  TCO2  --   --  34*  O2SAT 58.1 98.0 100.0    CBC Recent Labs  Lab 09/16/17 0518 09/17/17 0243 09/18/17 0403  HGB 13.5 11.1* 11.9*  HCT 42.4 35.9* 37.1*  WBC 20.3* 11.8* 15.4*  PLT 175 143* 186    COAGULATION No results for input(s): INR in the last 168 hours.  CARDIAC   No results for input(s): TROPONINI in the last 168 hours. No results for input(s): PROBNP in the last 168 hours.   CHEMISTRY Recent Labs  Lab 09/13/17 0418  09/15/17 0339 09/15/17 2255 09/16/17 0518 09/17/17 0243 09/17/17 1834 09/17/17 2141 09/18/17 0403  NA  --    < > 148* 145 148* 140  --  141 148*  K  --    < > 3.2* 3.7 4.2 3.1*  --  3.2* 3.9  CL  --    < > 106 105 107 102  --  104 110  CO2  --    < > 28 27 26 26   --  25 26  GLUCOSE  --    < > 240* 205* 214* 425*  --  370* 139*  BUN  --    < > 121* 111* 106* 80*  --  82* 78*  CREATININE  --    < > 3.00* 2.90* 2.86* 2.73*  --  2.58* 2.67*  CALCIUM  --    < > 8.2* 8.2* 8.4* 8.0*  --  8.0* 8.4*  MG 2.1  --  2.2 2.1 2.1  --  2.2  --  2.2  PHOS 4.0  --  5.3*  --  5.0*  --  4.6  --  3.9   < > = values in this interval not displayed.   Estimated Creatinine Clearance: 27.8 mL/min (A) (by C-G formula based on SCr of 2.67 mg/dL (H)).  LIVER Recent Labs  Lab 09/13/17 1100 09/17/17 0243 09/18/17 0403  AST 67* 35 53*  ALT 161* 59 63  ALKPHOS 392* 271* 391*  BILITOT 1.6* 1.3* 1.1  PROT 5.9* 5.4* 5.7*  ALBUMIN 2.3* 1.8* 1.9*   INFECTIOUS Recent Labs  Lab 09/12/17 1021 09/13/17 0418 09/14/17 0457 09/15/17 0339  LATICACIDVEN  --   --   --  1.4  PROCALCITON 5.73 5.71 6.36  --    ENDOCRINE CBG (last 3)   Recent Labs    09/17/17 2345 09/18/17 0331 09/18/17 0727  GLUCAP 179* 131* 106*   IMAGING x48h  - image(s) personally visualized  -   highlighted in bold Dg Chest Port 1 View  Result Date: 09/16/2017 CLINICAL DATA:  62 year old male status post re-intubation EXAM: PORTABLE CHEST 1 VIEW COMPARISON:  Prior chest x-ray obtained earlier today FINDINGS: The patient has been intubated. The tip of the endotracheal tube is 4 cm above the carina. A gastric tube is been placed. The tip of the tube overlies the GE junction. Stable cardiomegaly. 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 Left subclavian approach biventricular cardiac rhythm maintenance device. The leads remain in unchanged position. Right upper extremity PIC Catheter tip overlies the cavoatrial junction. Pulmonary vascular congestion bordering on mild interstitial edema. Persistent left basilar patchy airspace opacity. IMPRESSION: 1. The tip of the endotracheal tube is 4 cm above the carina. 2. The tip of the gastric tube overlies the GE junction. Recommend advancing for more optimal placement in the stomach. 3. Persistent left basilar airspace opacity likely representing a combination of pleural fluid and atelectasis. 4. Cardiomegaly and pulmonary vascular congestion bordering on mild edema. Electronically Signed   By: Jacqulynn Cadet M.D.   On: 09/16/2017 16:11   ASSESSMENT / PLAN:  s/p VT/VF arrest in setting of NSTEMI Ischemic cardiomyopathy - EF 15%, AICD in place  Cardiogenic shock (resolved) Atrial fib  plan Continue ASA Dc brillinta (given planned trach-->will plan for Thursday) Start plavix in place of [place] after trach Transition from IV amio to VT amio: 200mg  bid x 7-10d; then down to 200mg /d (after 1/29) Hold off BB for now Hold ARB/ace-I d/t renal dysfxn Hold lasix again today  Holding heparin   Anoxic encephalopathy status post PEA arrest w/ ICU related  physical  deconditioning  -has made some improvement Plan Supportive care Minimize sedation OOB Routine delirium interventions   Dysphagia  Plan NPO  Continue tubefeeds for now, NPO after midnight for trach in AM  acute respiratory failure s/p cardiac arrest & now vent dependent d/t inability to protect airway  Reintubated on 1/21 due to ineffective airway protection.  Will need trach Plan Continue full ventilator support PAD protocol with RASS goal 0 Planning on trach 1/24  AKI: initial insult:  cardiac arrest but then complicated by diuresis and Urinary retention w/ MIld right hydronephrosis   on 1/19 w/ MIld Hydronephrosis identified via f/c on CT of the abdomen.  -f/c placed -scr improved some w/ IVFs Plan Urology called, nothing to add Renal dose meds Continue doxyzosin   Fluid and electrolyte imbalance: hypernatremia & hypokalemia Plan Cont free water replacement  F/u am chemistry  Replace potassium  Elevated LFTs Plan Trend  Fever w/ leukocytosis on 1/17 with elevated pro-calcitonin >source unclear UC neg, BC neg sputum neg Plan Day 6/7 zosyn   Consulted urology for obstructive uropathy, nothing further to do  Hyperglycemia  Plan Ssi, adding basal dosing  FAMILY  - Updates: No family bedside 1/23  - Inter-disciplinary family meet or Palliative Care meeting due by:  day 7  The patient is critically ill with multiple organ systems failure and requires high complexity decision making for assessment and support, frequent evaluation and titration of therapies, application of advanced monitoring technologies and extensive interpretation of multiple databases.   Critical Care Time devoted to patient care services described in this note is  35  Minutes. This time reflects time of care of this signee Dr [name]. This critical care time does not reflect procedure time, or teaching time or supervisory time of PA/NP/Med student/Med Resident etc but could involve  care discussion time.  Rush Farmer, M.D. [facility] Pulmonary/Critical Care Medicine. Pager: [phone]. After hours pager: [phone].

## 2017-09-18 NOTE — Progress Notes (Signed)
RT note-Placed back to full support for increased work of breathing.

## 2017-09-18 NOTE — Progress Notes (Addendum)
Inpatient Diabetes Program Recommendations  AACE/ADA: New Consensus Statement on Inpatient Glycemic Control (2015)  Target Ranges:  Prepandial:   less than 140 mg/dL      Peak postprandial:   less than 180 mg/dL (1-2 hours)      Critically ill patients:  140 - 180 mg/dL   Lab Results  Component Value Date   GLUCAP 154 (H) 09/18/2017   HGBA1C 8.9 (H) 06/05/2013    Review of Glycemic Control Results for Mark Martin, [name] (MRN [id number]) as of 09/18/2017 14:24  Ref. Range 09/17/2017 23:45 09/18/2017 03:31 09/18/2017 07:27 09/18/2017 11:26  Glucose-Capillary Latest Ref Range: 65 - 99 mg/dL 179 (H) 131 (H) 106 (H) 154 (H)   Diabetes history: Type 2 DM Outpatient Diabetes medications: Toujeo 50 Units QD, Metformin 1,000 mg BID Current orders for Inpatient glycemic control: Novolog 5 Units Q4H, Novolog 0-15 Units Q4H  Inpatient Diabetes Program Recommendations:    Noted [name] today to add basal. Would recommend Lantus 25 Units QHS.   Thanks, Bronson Curb, MSN, RNC-OB Diabetes Coordinator [phone] (8a-5p)

## 2017-09-18 NOTE — Progress Notes (Signed)
Pharmacy Antibiotic Note  Mark Martin is a 62 y.o. male admitted on 09/05/2017 post cardiac arrest.   Patient is now afebrile, WBC slightly increased to 15.4. Patient received ceftriaxone from 1/12 > 1/18. Vancomycin is discontinued on 1/20. Remains on zosyn. No growth on cultures so far. Renal function remains stable (Scr 2.67, CrCl ~28 mL/min).   Plan: Zosyn 3.375g IV q8 hours - infuse over 4 hours Stop date entered for 7 days Monitor renal function, clinical picture, cx results  Height: 5\' 6"  (167.6 cm) Weight: 172 lb 2.9 oz (78.1 kg) IBW/kg (Calculated) : 63.8  Temp (24hrs), Avg:98.3 F (36.8 C), Min:97 F (36.1 C), Max:98.8 F (37.1 C)  Recent Labs  Lab 09/14/17 0457 09/15/17 0339 09/15/17 0947 09/15/17 2255 09/16/17 0518 09/17/17 0243 09/17/17 2141 09/18/17 0403  WBC 16.5* 14.8*  --   --  20.3* 11.8*  --  15.4*  CREATININE 3.19* 3.00*  --  2.90* 2.86* 2.73* 2.58* 2.67*  LATICACIDVEN  --  1.4  --   --   --   --   --   --   VANCOTROUGH  --   --  26*  --   --   --   --   --     Estimated Creatinine Clearance: 27.8 mL/min (A) (by C-G formula based on SCr of 2.67 mg/dL (H)).    No Known Allergies  Ceftriaxone 1/12 >>1/17 Vanc 1/18>>1/20 Zosyn 1/18>>  1/12 blood x2- neg 1/12 resp- normal flora 1/17 blood x2-ngtd 1/17 resp - ngtd  Thank you for allowing pharmacy to be a part of this patient's care.  Doylene Canard, PharmD Clinical Pharmacist  Pager: [phone] Clinical Phone for 09/18/2017 until 3:30pm: [phone] If after 3:30pm, please call main pharmacy at [phone] 09/18/2017 9:28 AM

## 2017-09-18 NOTE — Progress Notes (Signed)
Cardiology Follow-up   The patient is able to track with eye movement.  Agree with discontinuation of Brilinta.  When Plavix is started, I would recommend 150 mg initial dose followed by 75 mg daily thereafter.  My note yesterday outlined transition to oral amiodarone when appropriate.  Once stronger, extubated, will need an EP consultation to determine if any further management strategies to prevent recurrent ventricular tachycardia/fibrillation or indicated.

## 2017-09-19 ENCOUNTER — Inpatient Hospital Stay (HOSPITAL_COMMUNITY): Payer: Medicare Other

## 2017-09-19 LAB — BLOOD GAS, ARTERIAL
Acid-Base Excess: 4 mmol/L — ABNORMAL HIGH (ref 0.0–2.0)
Bicarbonate: 28.1 mmol/L — ABNORMAL HIGH (ref 20.0–28.0)
Drawn by: 40415
FIO2: 30
O2 Saturation: 97.2 %
PEEP: 5 cmH2O
Patient temperature: 98.6
RATE: 14 resp/min
VT: 500 mL
pCO2 arterial: 43.1 mmHg (ref 32.0–48.0)
pH, Arterial: 7.43 (ref 7.350–7.450)
pO2, Arterial: 95.1 mmHg (ref 83.0–108.0)

## 2017-09-19 LAB — BASIC METABOLIC PANEL
ANION GAP: 11 (ref 5–15)
BUN: 78 mg/dL — ABNORMAL HIGH (ref 6–20)
CALCIUM: 8.3 mg/dL — AB (ref 8.9–10.3)
CO2: 27 mmol/L (ref 22–32)
Chloride: 107 mmol/L (ref 101–111)
Creatinine, Ser: 2.26 mg/dL — ABNORMAL HIGH (ref 0.61–1.24)
GFR, EST AFRICAN AMERICAN: 34 mL/min — AB (ref >60)
GFR, EST NON AFRICAN AMERICAN: 29 mL/min — AB (ref >60)
Glucose, Bld: 140 mg/dL — ABNORMAL HIGH (ref 65–99)
Potassium: 3.5 mmol/L (ref 3.5–5.1)
Sodium: 145 mmol/L (ref 135–145)

## 2017-09-19 LAB — GLUCOSE, CAPILLARY
GLUCOSE-CAPILLARY: 98 mg/dL (ref 65–99)
GLUCOSE-CAPILLARY: 118 mg/dL — AB (ref 65–99)
GLUCOSE-CAPILLARY: 142 mg/dL — AB (ref 65–99)
Glucose-Capillary: 158 mg/dL — ABNORMAL HIGH (ref 65–99)
Glucose-Capillary: 167 mg/dL — ABNORMAL HIGH (ref 65–99)
Glucose-Capillary: 131 mg/dL — ABNORMAL HIGH (ref 65–99)

## 2017-09-19 LAB — PROTIME-INR
INR: 1.41
Prothrombin Time: 17.1 seconds — ABNORMAL HIGH (ref 11.4–15.2)

## 2017-09-19 LAB — CBC
HEMATOCRIT: 35 % — AB (ref 39.0–52.0)
Hemoglobin: 11 g/dL — ABNORMAL LOW (ref 13.0–17.0)
MCH: 26.4 pg (ref 26.0–34.0)
MCHC: 31.4 g/dL (ref 30.0–36.0)
MCV: 83.9 fL (ref 78.0–100.0)
PLATELETS: 205 10*3/uL (ref 150–400)
RBC: 4.17 MIL/uL — AB (ref 4.22–5.81)
RDW: 17.9 % — ABNORMAL HIGH (ref 11.5–15.5)
WBC: 11.8 10*3/uL — AB (ref 4.0–10.5)

## 2017-09-19 LAB — MAGNESIUM: Magnesium: 2.2 mg/dL (ref 1.7–2.4)

## 2017-09-19 LAB — PHOSPHORUS: PHOSPHORUS: 3.8 mg/dL (ref 2.5–4.6)

## 2017-09-19 LAB — APTT: APTT: 34 s (ref 24–36)

## 2017-09-19 MED ORDER — ROCURONIUM BROMIDE 50 MG/5ML IV SOLN
50.0000 mg | Freq: Once | INTRAVENOUS | Status: AC
  Administered 2017-09-19: 50 mg via INTRAVENOUS
  Filled 2017-09-19: qty 5

## 2017-09-19 MED ORDER — POTASSIUM CHLORIDE 10 MEQ/50ML IV SOLN
10.0000 meq | INTRAVENOUS | Status: AC
  Administered 2017-09-19 (×4): 10 meq via INTRAVENOUS
  Filled 2017-09-19 (×4): qty 50

## 2017-09-19 MED ORDER — ETOMIDATE 2 MG/ML IV SOLN
INTRAVENOUS | Status: AC
  Administered 2017-09-19: 20 mg via INTRAVENOUS
  Filled 2017-09-19: qty 10

## 2017-09-19 NOTE — Procedures (Signed)
Percutaneous Tracheostomy Placement  Consent from family.  Patient sedated, paralyzed and position.  Placed on 100% FiO2 and RR matched.  Area cleaned and draped.  Lidocaine/epi injected.  Skin incision done followed by blunt dissection.  Trachea palpated then punctured, catheter passed and visualized bronchoscopically.  Wire placed and visualized.  Catheter removed.  Airway then crushed and dilated.  Size 6 cuffed shiley trach placed and visualized bronchoscopically well above carina.  Good volume returns.  Patient tolerated the procedure well without complications.  Minimal blood loss.  CXR ordered and pending.  Arelia Volpe G. Marvell Stavola, M.D.  Pulmonary/Critical Care Medicine. Pager: [phone]. After hours pager: [phone]. 

## 2017-09-19 NOTE — Progress Notes (Signed)
PULMONARY / CRITICAL CARE MEDICINE   Name: Mark Martin MRN: [id number] DOB: 02-04-1956    ADMISSION DATE:  09/05/2017   REFERRING MD: Emergency department physician  CHIEF COMPLAINT: Cardiac arrest  BRIEF  Mark Martin is a 62 year old male with known ischemic cardiomyopathy, ejection fraction less than 20%, with an AICD in place.  Was in his usual state of health until 09/05/2017 when he had witnessed arrest.  Family started CPR EMS arrived and ROSC within 25 minutes with 1 dose of epinephrine.  His AICD was interrogated and showed 25 minutes of VT in PEA.  His AICD did shock him x1. He is transferred to [facility] emergency department where he was intubated and started on epinephrine drip.  He continued to have multiple episodes of PEA requiring increasing doses of epinephrine. PCCM called for admission and hypothermia protocol.     STUDIES:  09/05/2017 2D echo >>>EF 15%, possible early forming thrombus, severe diffuse hypokinesis, grade 2 diastolic dysfunction, PA peak pressure 66mmHg 09/05/2017 CT of the head >>>neg acute  1/15 EEG>>> diffuse slowing; non specific etiology patter. No epileptiform discharge. 1/19: CT abd/pelvis 1. Marked distension of the urinary bladder measuring approximately 1370 cc. There is bilateral pelvocaliectasis and mild hydroureter. Correlate for any clinical signs or symptoms of bladder outlet obstruction. 2. Mild prostate gland enlargement. 3. Bilateral pleural effusions and overlying areas of atelectasis and airspace consolidation. Cannot rule out pneumonia. 4.  Aortic Atherosclerosis (ICD10-I70.0). 5. Gallstone identified within the neck of gallbladder. Renal US 1/20: Mild hydronephrosis on the left.  No hydronephrosis on the right.  CULTURES: Blood 1/12>>>NTD Procal 7.14 Sputum 1/12>>> normal flora  Urine culture 117 2019-negative 09/12/2017 blood cultures x2>> 09/12/2016 sputum culture>>neg  Blood ANTIBIOTICS: Rocephin 1/12>>>  09/13/2017  Zosyn>>1/24 09/13/2017 vancomycin>>1/21  LINES/TUBES: 09/05/2017 ETT>> 09/05/2017 R fem CVL>>> out 09/05/2017 right femoral aline>>> out Right double-lumen PICC basilic vein 12/03/8117>> 09/14/2017 Foley catheter placed for bladder obstruction>>  SIGNIFICANT EVENTS: 09/05/2017 VT PEA arrest 09/08/17 - Pressor demand decreasing, no events overnight 09/09/17 - worsening AKI. Family at beside. On vent. Daughter at bedside and wife report less responsiveness x 48h/ Last prn sedation of fent/versed on 09/06/17 - per RN moves all 4s, non purposeful and weaning on SBT. Off pressors. Has femoral lines 1/16 - No acute change overnight.  Tolerating pressure support wean 5/5.  Looks good from respiratory standpoint.  More awake but not following commands. 1/18- Weaning this am on 30%, 5/5, pulling good volumes with prn fentanyl and versed. MAE x 4, but does not follow commands 09/14/2017 intermittently follow commands. 09/14/2017 was found to have bladder obstruction on CT of the abdomen.  Foley was placed.  SUBJECTIVE/OVERNIGHT/INTERVAL HX Awaiting tracheostomy  VITAL SIGNS: BP 96/69   Pulse 78   Temp 98.9 F (37.2 C) (Oral)   Resp 18   Ht 5\' 6"  (1.676 m)   Wt 169 lb 15.6 oz (77.1 kg)   SpO2 97%   BMI 27.43 kg/m   HEMODYNAMICS:    VENTILATOR SETTINGS: Vent Mode: PSV;CPAP FiO2 (%):  [30 %-40 %] 30 % Set Rate:  [14 bmp] 14 bmp Vt Set:  [500 mL] 500 mL PEEP:  [5 cmH20] 5 cmH20 Pressure Support:  [5 cmH20-10 cmH20] 5 cmH20 Plateau Pressure:  [16 cmH20-18 cmH20] 16 cmH20  INTAKE / OUTPUT:  Intake/Output Summary (Last 24 hours) at 09/19/2017 0904 Last data filed at 09/19/2017 0800 Gross per 24 hour  Intake 1870 ml  Output 1840 ml  Net 30 ml    PHYSICAL  EXAMINATION:  General: 62 year old male patient currently awake, appears comfortable on the ventilator HEENT: Normocephalic atraumatic orally intubated.  No jugular venous distention mucous membranes are moist Pulmonary: Clear to  auscultation equal chest rise Cardiac: Holosystolic murmur, regular rate with atrial fibrillation on telemetry Abdomen: Soft nontender no organomegaly Extremities/musculoskeletal: Generalized edema, warm, brisk capillary refill strong pulses Neurology: Awake, interactive moves all extremities no focal motor deficits  PULMONARY Recent Labs  Lab 09/15/17 1200 09/15/17 2125 09/16/17 2228 09/19/17 0415  PHART  --  7.453* 7.451* 7.430  PCO2ART  --  41.5 46.3 43.1  PO2ART  --  111* 250.0* 95.1  HCO3  --  28.6* 32.3* 28.1*  TCO2  --   --  34*  --   O2SAT 58.1 98.0 100.0 97.2    CBC Recent Labs  Lab 09/17/17 0243 09/18/17 0403 09/19/17 0402  HGB 11.1* 11.9* 11.0*  HCT 35.9* 37.1* 35.0*  WBC 11.8* 15.4* 11.8*  PLT 143* 186 205    COAGULATION Recent Labs  Lab 09/19/17 0402  INR 1.41    CARDIAC   No results for input(s): TROPONINI in the last 168 hours. No results for input(s): PROBNP in the last 168 hours.   CHEMISTRY Recent Labs  Lab 09/16/17 0518 09/17/17 0243 09/17/17 1834 09/17/17 2141 09/18/17 0403 09/18/17 1824 09/19/17 0402  NA 148* 140  --  141 148*  --  145  K 4.2 3.1*  --  3.2* 3.9  --  3.5  CL 107 102  --  104 110  --  107  CO2 26 26  --  25 26  --  27  GLUCOSE 214* 425*  --  370* 139*  --  140*  BUN 106* 80*  --  82* 78*  --  78*  CREATININE 2.86* 2.73*  --  2.58* 2.67*  --  2.26*  CALCIUM 8.4* 8.0*  --  8.0* 8.4*  --  8.3*  MG 2.1  --  2.2  --  2.2 2.2 2.2  PHOS 5.0*  --  4.6  --  3.9 3.5 3.8   Estimated Creatinine Clearance: 32.7 mL/min (A) (by C-G formula based on SCr of 2.26 mg/dL (H)).  LIVER Recent Labs  Lab 09/13/17 1100 09/17/17 0243 09/18/17 0403 09/19/17 0402  AST 67* 35 53*  --   ALT 161* 59 63  --   ALKPHOS 392* 271* 391*  --   BILITOT 1.6* 1.3* 1.1  --   PROT 5.9* 5.4* 5.7*  --   ALBUMIN 2.3* 1.8* 1.9*  --   INR  --   --   --  1.41   INFECTIOUS Recent Labs  Lab 09/12/17 1021 09/13/17 0418 09/14/17 0457  09/15/17 0339  LATICACIDVEN  --   --   --  1.4  PROCALCITON 5.73 5.71 6.36  --    ENDOCRINE CBG (last 3)  Recent Labs    09/18/17 2345 09/19/17 0328 09/19/17 0739  GLUCAP 162* 98 118*   IMAGING x48h  - image(s) personally visualized  -   highlighted in bold Dg Chest Port 1 View  Result Date: 09/19/2017 CLINICAL DATA:  Intubation EXAM: PORTABLE CHEST 1 VIEW COMPARISON:  Three days ago FINDINGS: Endotracheal tube tip just below the clavicular heads. An orogastric tube and side-port reaches the stomach. Biventricular ICD/pacer from the left. Right upper extremity PICC with tip at the upper cavoatrial junction. Lower volumes and increased diffuse lung opacity, likely combination of edema and effusions. Cardiopericardial enlargement. Status post CABG. IMPRESSION: 1.  Progressive lung opacity, favor CHF. 2. Unremarkable positioning of tubes and central line. Electronically Signed   By: Monte Fantasia M.D.   On: 09/19/2017 08:06   ASSESSMENT / PLAN:  s/p VT/VF arrest in setting of NSTEMI Ischemic cardiomyopathy - EF 15%, AICD in place  Cardiogenic shock (resolved) Atrial fib  plan Cont asa plavix to begin 1/25 s/p trach Cont amiodarone (dose taper placed) Hold arb/ace-I d/t renal failure Hold bb No lasix today  Holding IV heparin   Anoxic encephalopathy status post PEA arrest w/ ICU related physical  deconditioning  -has made some improvement Plan Routine delirium interventions Supportive care  Dysphagia  Plan Resume tubefeeds after trach   acute respiratory failure s/p cardiac arrest & now vent dependent d/t inability to protect airway  Reintubated on 1/21 due to ineffective airway protection.  Will need trach Plan For trach today, anticipate rapid transition to ATC  AKI: initial insult:  cardiac arrest but then complicated by diuresis and Urinary retention w/ MIld right hydronephrosis   on 1/19 w/ MIld Hydronephrosis identified via f/c on CT of the abdomen.  -f/c  placed -scr improved some w/ IVFs Urology called, nothing to add Plan Cont doxyzosin Repeat chem in am   Fluid and electrolyte imbalance: hypernatremia & hypokalemia Plan Cont free water replacement Am chem Replace K  Elevated LFTs Plan Trend   Fever w/ leukocytosis on 1/17 with elevated pro-calcitonin >source unclear UC neg, BC neg sputum neg Plan Day 7/7 zosyn  Hyperglycemia  Plan ssi and basal dosing   FAMILY  - Updates: No family bedside 1/23  - Inter-disciplinary family meet or Palliative Care meeting due by:  day 7  Attending Note:  62 year old male s/p cardiac arrest and anoxic brain injury.  Patient is weaning but unable to protect his airway.  On exam, alert and interactive, moving all ext to command but weak gag.  I reviewed CXR myself, ETT is in good position.  Discussed with PCCM-NP.  Will proceed with tracheostomy today.  Advance quickly post trach.  Hold off PEG for now until able to perform SLP in AM.  Abx off today.  Will hold in the ICU.  The patient is critically ill with multiple organ systems failure and requires high complexity decision making for assessment and support, frequent evaluation and titration of therapies, application of advanced monitoring technologies and extensive interpretation of multiple databases.   Critical Care Time devoted to patient care services described in this note is  35  Minutes. This time reflects time of care of this signee Dr [name]. This critical care time does not reflect procedure time, or teaching time or supervisory time of PA/NP/Med student/Med Resident etc but could involve care discussion time.  Rush Farmer, M.D. [facility] Pulmonary/Critical Care Medicine. Pager: [phone]. After hours pager: [phone].

## 2017-09-19 NOTE — Procedures (Signed)
Bedside Tracheostomy Insertion Procedure Note   Patient Details:   Name: Mark Martin DOB: 1956-03-20 MRN: [id number]  Procedure: Tracheostomy  Pre Procedure Assessment: ET Tube Size: 7.5 ET Tube secured at lip (cm): 22 Bite block in place: No Breath Sounds: Clear  Post Procedure Assessment: BP (!) 84/61   Pulse 91   Temp 98.5 F (36.9 C) (Oral)   Resp 20   Ht 5\' 6"  (1.676 m)   Wt 169 lb 15.6 oz (77.1 kg)   SpO2 98%   BMI 27.43 kg/m  O2 sats: stable throughout Complications: No apparent complications Patient did tolerate procedure well Tracheostomy Brand:Shiley Tracheostomy Style:Cuffed Tracheostomy Size: 6 Tracheostomy Secured SXJ:[id number], velcro Tracheostomy Placement Confirmation:Trach cuff visualized and in place and Chest X ray ordered for placement    Kathie Dike 09/19/2017, 1:25 PM

## 2017-09-19 NOTE — Procedures (Signed)
Bronchoscopy Procedure Note Mark Martin [id number] 03/15/1956  Procedure: Bronchoscopy Indications: Diagnostic evaluation of the airways  Procedure Details Consent: Risks of procedure as well as the alternatives and risks of each were explained to the (patient/caregiver).  Consent for procedure obtained. Time Out: Verified patient identification, verified procedure, site/side was marked, verified correct patient position, special equipment/implants available, medications/allergies/relevent history reviewed, required imaging and test results available.  Performed  In preparation for procedure, patient was given 100% FiO2 and bronchoscope lubricated. Sedation: Benzodiazepines, Muscle relaxants, Etomidate and Fentanyl  Airway entered and the following bronchi were examined: RUL, RML, RLL, LUL, LLL and Bronchi.   Bronchoscope removed.  , Patient placed back on 100% FiO2 at conclusion of procedure.    Evaluation Hemodynamic Status: BP stable throughout; O2 sats: stable throughout Patient's Current Condition: stable Specimens:  None Complications: No apparent complications Patient did tolerate procedure well.   Mark Martin 09/19/2017

## 2017-09-19 NOTE — Progress Notes (Signed)
   Will have tracheostomy today.  We will follow and advise as progression is made.

## 2017-09-19 NOTE — Progress Notes (Signed)
Tube feeds stopped in preparation for trach procedure in AM. Will continue to closely monitor.  Sherlie Ban, RN

## 2017-09-20 ENCOUNTER — Inpatient Hospital Stay (HOSPITAL_COMMUNITY): Payer: Medicare Other

## 2017-09-20 DIAGNOSIS — Z930 Tracheostomy status: Secondary | ICD-10-CM

## 2017-09-20 LAB — GLUCOSE, CAPILLARY
GLUCOSE-CAPILLARY: 108 mg/dL — AB (ref 65–99)
GLUCOSE-CAPILLARY: 199 mg/dL — AB (ref 65–99)
GLUCOSE-CAPILLARY: 242 mg/dL — AB (ref 65–99)
GLUCOSE-CAPILLARY: 245 mg/dL — AB (ref 65–99)
GLUCOSE-CAPILLARY: 66 mg/dL (ref 65–99)
Glucose-Capillary: 101 mg/dL — ABNORMAL HIGH (ref 65–99)
Glucose-Capillary: 121 mg/dL — ABNORMAL HIGH (ref 65–99)

## 2017-09-20 MED ORDER — DEXTROSE 50 % IV SOLN
INTRAVENOUS | Status: AC
  Administered 2017-09-20: 25 mL
  Filled 2017-09-20: qty 50

## 2017-09-20 MED ORDER — FUROSEMIDE 10 MG/ML IJ SOLN
40.0000 mg | Freq: Once | INTRAMUSCULAR | Status: AC
  Administered 2017-09-20: 40 mg via INTRAVENOUS
  Filled 2017-09-20: qty 4

## 2017-09-20 MED ORDER — POTASSIUM CHLORIDE 20 MEQ/15ML (10%) PO SOLN
40.0000 meq | Freq: Once | ORAL | Status: AC
  Administered 2017-09-20: 40 meq
  Filled 2017-09-20: qty 30

## 2017-09-20 NOTE — Plan of Care (Signed)
Family of pt and RN discussed the possible use of bilateral safety restraints for the patient. Pt has dislodged the ventilator from his trach several times.

## 2017-09-20 NOTE — Progress Notes (Signed)
   Successful trach yesterday  Follows some commands  Continue current therapy  Please reconsult [place] when patient able to undergo further testing with reference to etiology of cardiac arrest.

## 2017-09-20 NOTE — Progress Notes (Signed)
Cortrak Tube Team Note:  Consult received to place a Cortrak feeding tube.   A 10 F Cortrak tube was placed in the right nare and secured with a nasal bridle at 87 cm. Per the Cortrak monitor reading the tube tip is post pyloric.   X-ray is required, abdominal x-ray has been ordered by the Cortrak team. Please confirm tube placement before using the Cortrak tube.   If the tube becomes dislodged please keep the tube and contact the Cortrak team at [email or web address] (password [email or web address]) for replacement.  If after hours and replacement cannot be delayed, place a NG tube and confirm placement with an abdominal x-ray.    Mariana Single RD, LDN Clinical Nutrition Pager # [phone]

## 2017-09-20 NOTE — Progress Notes (Signed)
PULMONARY / CRITICAL CARE MEDICINE   Name: Elior Robinette MRN: [id number] DOB: [birth year]    ADMISSION DATE:  09/05/2017   REFERRING MD: Emergency department physician  CHIEF COMPLAINT: Cardiac arrest  BRIEF  Mr.Colleran is a 62 year old male with known ischemic cardiomyopathy, ejection fraction less than 20%, with an AICD in place.  Was in his usual state of health until 09/05/2017 when he had witnessed arrest.  Family started CPR EMS arrived and ROSC within 25 minutes with 1 dose of epinephrine.  His AICD was interrogated and showed 25 minutes of VT in PEA.  His AICD did shock him x1. He is transferred to [facility] emergency department where he was intubated and started on epinephrine drip.  He continued to have multiple episodes of PEA requiring increasing doses of epinephrine. PCCM called for admission and hypothermia protocol.     STUDIES:  09/05/2017 2D echo >>>EF 15%, possible early forming thrombus, severe diffuse hypokinesis, grade 2 diastolic dysfunction, PA peak pressure 32mmHg 09/05/2017 CT of the head >>>neg acute  1/15 EEG>>> diffuse slowing; non specific etiology patter. No epileptiform discharge. 1/19: CT abd/pelvis 1. Marked distension of the urinary bladder measuring approximately 1370 cc. There is bilateral pelvocaliectasis and mild hydroureter. Correlate for any clinical signs or symptoms of bladder outlet obstruction. 2. Mild prostate gland enlargement. 3. Bilateral pleural effusions and overlying areas of atelectasis and airspace consolidation. Cannot rule out pneumonia. 4.  Aortic Atherosclerosis (ICD10-I70.0). 5. Gallstone identified within the neck of gallbladder. Renal US 1/20: Mild hydronephrosis on the left.  No hydronephrosis on the right.  CULTURES: Blood 1/12>>>NTD Procal 7.14 Sputum 1/12>>> normal flora  Urine culture 117 2019-negative 09/12/2017 blood cultures x2>> 09/12/2016 sputum culture>>neg  Blood ANTIBIOTICS: Rocephin 1/12>>>off   09/13/2017 Zosyn>>1/24 09/13/2017 vancomycin>>1/21  LINES/TUBES: 09/05/2017 ETT>> 09/05/2017 R fem CVL>>> out 09/05/2017 right femoral aline>>> out Right double-lumen PICC basilic vein 2/94/7654>> 09/14/2017 Foley catheter placed for bladder obstruction>>  SIGNIFICANT EVENTS: 09/05/2017 VT PEA arrest 09/08/17 - Pressor demand decreasing, no events overnight 09/09/17 - worsening AKI. Family at beside. On vent. Daughter at bedside and wife report less responsiveness x 48h/ Last prn sedation of fent/versed on 09/06/17 - per RN moves all 4s, non purposeful and weaning on SBT. Off pressors. Has femoral lines 1/16 - No acute change overnight.  Tolerating pressure support wean 5/5.  Looks good from respiratory standpoint.  More awake but not following commands. 1/18- Weaning this am on 30%, 5/5, pulling good volumes with prn fentanyl and versed. MAE x 4, but does not follow commands 09/14/2017 intermittently follow commands. 09/14/2017 was found to have bladder obstruction on CT of the abdomen.  Foley was placed.  SUBJECTIVE/OVERNIGHT/INTERVAL HX Comfortable   VITAL SIGNS: BP 106/66   Pulse 84   Temp 98.8 F (37.1 C) (Oral)   Resp 19   Ht 5\' 6"  (1.676 m)   Wt 171 lb 15.3 oz (78 kg)   SpO2 100%   BMI 27.75 kg/m   HEMODYNAMICS:    VENTILATOR SETTINGS: Vent Mode: CPAP;PSV FiO2 (%):  [30 %-40 %] 35 % Set Rate:  [14 bmp] 14 bmp Vt Set:  [500 mL] 500 mL PEEP:  [5 cmH20] 5 cmH20 Pressure Support:  [5 cmH20] 5 cmH20 Plateau Pressure:  [15 cmH20-20 cmH20] 20 cmH20  INTAKE / OUTPUT:  Intake/Output Summary (Last 24 hours) at 09/20/2017 1013 Last data filed at 09/20/2017 1000 Gross per 24 hour  Intake 300 ml  Output 1135 ml  Net -835 ml    PHYSICAL EXAMINATION:  General: This is a 62 year old male patient, currently awake on ventilator.  No acute distress. HEENT: #6 cuffed tracheostomy with bloody secretions, some frothy bloody secretions noted in the tracheostomy tube. Pulmonary:  Scattered rhonchi, no accessory use, Cardiac: Holosystolic murmur, paced rhythm on telemetry Abdomen: Soft nontender Extremities: Moderate diffuse anasarca, brisk cap refill, warm, dry GU: Clear yellow Neuro: Awake, interactive, moves extremities, intermittently follows commands (he may be some degree of language barrier)  PULMONARY Recent Labs  Lab 09/15/17 1200 09/15/17 2125 09/16/17 2228 09/19/17 0415  PHART  --  7.453* 7.451* 7.430  PCO2ART  --  41.5 46.3 43.1  PO2ART  --  111* 250.0* 95.1  HCO3  --  28.6* 32.3* 28.1*  TCO2  --   --  34*  --   O2SAT 58.1 98.0 100.0 97.2    CBC Recent Labs  Lab 09/17/17 0243 09/18/17 0403 09/19/17 0402  HGB 11.1* 11.9* 11.0*  HCT 35.9* 37.1* 35.0*  WBC 11.8* 15.4* 11.8*  PLT 143* 186 205    COAGULATION Recent Labs  Lab 09/19/17 0402  INR 1.41    CARDIAC   No results for input(s): TROPONINI in the last 168 hours. No results for input(s): PROBNP in the last 168 hours.   CHEMISTRY Recent Labs  Lab 09/16/17 0518 09/17/17 0243 09/17/17 1834 09/17/17 2141 09/18/17 0403 09/18/17 1824 09/19/17 0402  NA 148* 140  --  141 148*  --  145  K 4.2 3.1*  --  3.2* 3.9  --  3.5  CL 107 102  --  104 110  --  107  CO2 26 26  --  25 26  --  27  GLUCOSE 214* 425*  --  370* 139*  --  140*  BUN 106* 80*  --  82* 78*  --  78*  CREATININE 2.86* 2.73*  --  2.58* 2.67*  --  2.26*  CALCIUM 8.4* 8.0*  --  8.0* 8.4*  --  8.3*  MG 2.1  --  2.2  --  2.2 2.2 2.2  PHOS 5.0*  --  4.6  --  3.9 3.5 3.8   Estimated Creatinine Clearance: 32.9 mL/min (A) (by C-G formula based on SCr of 2.26 mg/dL (H)).  LIVER Recent Labs  Lab 09/13/17 1100 09/17/17 0243 09/18/17 0403 09/19/17 0402  AST 67* 35 53*  --   ALT 161* 59 63  --   ALKPHOS 392* 271* 391*  --   BILITOT 1.6* 1.3* 1.1  --   PROT 5.9* 5.4* 5.7*  --   ALBUMIN 2.3* 1.8* 1.9*  --   INR  --   --   --  1.41   INFECTIOUS Recent Labs  Lab 09/14/17 0457 09/15/17 0339  LATICACIDVEN  --   1.4  PROCALCITON 6.36  --    ENDOCRINE CBG (last 3)  Recent Labs    09/20/17 0306 09/20/17 0335 09/20/17 0730  GLUCAP 66 108* 101*   IMAGING x48h  - image(s) personally visualized  -   highlighted in bold Dg Chest Port 1 View  Result Date: 09/19/2017 CLINICAL DATA:  Intubation EXAM: PORTABLE CHEST 1 VIEW COMPARISON:  Three days ago FINDINGS: Endotracheal tube tip just below the clavicular heads. An orogastric tube and side-port reaches the stomach. Biventricular ICD/pacer from the left. Right upper extremity PICC with tip at the upper cavoatrial junction. Lower volumes and increased diffuse lung opacity, likely combination of edema and effusions. Cardiopericardial enlargement. Status post CABG. IMPRESSION: 1. Progressive lung opacity, favor CHF.  2. Unremarkable positioning of tubes and central line. Electronically Signed   By: Monte Fantasia M.D.   On: 09/19/2017 08:06   Dg Abd Portable 1v  Result Date: 09/20/2017 CLINICAL DATA:  Feeding tube placement. EXAM: PORTABLE ABDOMEN - 1 VIEW COMPARISON:  09/20/2017 at 0657 hours FINDINGS: The patient is rotated to the right. A feeding tube has been placed and is partially coiled in the right upper abdomen, likely in the distal stomach. Gas is present in nondilated loops of small and large bowel without evidence of obstruction. Sternotomy wires and ICD are partially visualized. Lumbar spondylosis is noted. IMPRESSION: Feeding tube in the distal stomach. Electronically Signed   By: Logan Bores M.D.   On: 09/20/2017 09:38   Dg Abd Portable 1v  Result Date: 09/20/2017 CLINICAL DATA:  NG tube placement. EXAM: PORTABLE ABDOMEN - 1 VIEW COMPARISON:  09/20/2017.  CT 09/14/2017. FINDINGS: NG tube is again noted looped in the distal esophagus with tip in the esophagus. Repositioning should be considered. Gallstone noted. No bowel distention. No free air. Prior scratched it prior median sternotomy. AICD noted. IMPRESSION: NG tube is again noted looped in the  distal esophagus, repositioning should be considered. These results will be called to the ordering clinician or representative by the Radiologist Assistant, and communication documented in the PACS or zVision Dashboard. Electronically Signed   By: Marcello Moores  Register   On: 09/20/2017 08:45   Dg Abd Portable 1v  Result Date: 09/20/2017 CLINICAL DATA:  Nasogastric tube placement EXAM: PORTABLE ABDOMEN - 1 VIEW COMPARISON:  September 19, 2017 abdominal radiograph and CT abdomen and pelvis September 14, 2017 FINDINGS: Nasogastric tube extends to the gastroesophageal junction, where it loops with the tip directed into the esophagus. There is no bowel dilatation or air-fluid levels to suggest bowel obstruction. No free air. There is a laminated gallstone in the right abdomen measuring 1.2 x 1.0 cm. IMPRESSION: Nasogastric tube loops upon itself at the gastroesophageal junction with the tip directed into the distal esophagus. No bowel obstruction or free air. Laminated gallstone right upper abdomen. Electronically Signed   By: Lowella Grip III M.D.   On: 09/20/2017 07:36   Dg Abd Portable 1v  Result Date: 09/20/2017 CLINICAL DATA:  NG tube placement EXAM: PORTABLE ABDOMEN - 1 VIEW COMPARISON:  CT 09/14/2017, radiograph 09/13/2017 FINDINGS: Partially visualized sternotomy and cardiac pacing leads. Visible gas pattern nonobstructed. Calcified gallstone in the right upper quadrant. Esophageal tube tip projects over the proximal stomach, side-port in the region of GE junction. IMPRESSION: 1. Esophageal tube tip overlies proximal stomach, side-port in the region of GE junction, further advancement could be considered for more optimal positioning 2. Gallstone Electronically Signed   By: Donavan Foil M.D.   On: 09/20/2017 00:28   ASSESSMENT / PLAN:  s/p VT/VF arrest in setting of NSTEMI Ischemic cardiomyopathy - EF 15%, AICD in place  Cardiogenic shock (resolved) Atrial fib  plan Continue aspirin Plavix to  initiate on 1/26 Continue amiodarone Holding ARB/ACE inhibitor due to renal dysfunction Holding beta-blockade Discontinued IV heparin (per discussion with cardiology)   Anoxic encephalopathy status post PEA arrest w/ ICU related physical  deconditioning  -has made some improvement Plan Continue supportive care Continue routine delirium interventions  Dysphagia  Plan Continue tube feeds May require PEG   acute respiratory failure s/p cardiac arrest & now vent dependent d/t inability to protect airway Tracheostomy dependence, 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   Portable chest x-ray personally  reviewed: Demonstrate some increased pulmonary edema comparison with prior film Plan Daily trach collar trialing; nocturnal vent for now  Pressure support as indicated Lasix x1 Mobilize Will need SLP evaluation once secretions will allow for Passy-Muir valve trials  AKI: initial insult:  cardiac arrest but then complicated by diuresis and Urinary retention w/ MIld right hydronephrosis   on 1/19 w/ MIld Hydronephrosis identified via f/c on CT of the abdomen.  -f/c placed -scr improved  Urology called, nothing to add Plan Continue doxazosin Lasix x1 Repeat chemistry in a.m.  Fluid and electrolyte imbalance: hypernatremia & hypokalemia Plan Continue gentle free water replacement Prophylactically replace potassium given plan Lasix dose  Elevated LFTs these had improved Plan Complete metabolic panel  Fever w/ leukocytosis on 1/17 with elevated pro-calcitonin >source unclear UC neg, BC neg sputum neg Plan Completed 7 days of antibiotics We will continue to trend fever and white blood cell curve  Hyperglycemia  Plan ssi and basal dosing   FAMILY  - Updates: No family bedside 1/23  - Inter-disciplinary family meet or Palliative Care meeting due by:  day 7  DVT prophylaxis: scd SUP: ppi Diet: tubefeed Activity: br Disposition :  ICU  Erick Colace ACNP-BC [place] Pager # [phone] OR # [phone] if no answer  Attending Note:  62 year old male s/p cardiac arrest that is now trached and weaning on exam.  I reviewed CXR myself, trach is in good position.  His fluid status is virtually even at this point.  Will attempt TC for a few hours today and see how patient is able to tolerate.  D/C abx.  Replace K.  AM labs ordered.  Brother updated bedside.  Will hold off PEG for now and order a swallow evaluation on Monday if patient is able to tolerate TC well.  Transfer to SDU vent bed and to [facility] service with PCCM following for trach/vent management.  The patient is critically ill with multiple organ systems failure and requires high complexity decision making for assessment and support, frequent evaluation and titration of therapies, application of advanced monitoring technologies and extensive interpretation of multiple databases.   Critical Care Time devoted to patient care services described in this note is  35  Minutes. This time reflects time of care of this signee Dr [name]. This critical care time does not reflect procedure time, or teaching time or supervisory time of PA/NP/Med student/Med Resident etc but could involve care discussion time.  Rush Farmer, M.D. [facility] Pulmonary/Critical Care Medicine. Pager: [phone]. After hours pager: [phone].

## 2017-09-20 NOTE — Progress Notes (Signed)
Hypoglycemic Event  CBG: 66  Treatment: 25 mL of Dextrose 50%  Symptoms: None Noted  Follow-up CBG: Time:0330 CBG Result:108  Possible Reasons for Event: Scheduled Insulin+NPO status     Thamas Jaegers

## 2017-09-20 NOTE — Progress Notes (Signed)
Patient placed on 35% ATC without complications. Vital signs stable at this time. Patient tolerating well. RT will continue to monitor.

## 2017-09-21 DIAGNOSIS — Z4659 Encounter for fitting and adjustment of other gastrointestinal appliance and device: Secondary | ICD-10-CM

## 2017-09-21 DIAGNOSIS — Z9289 Personal history of other medical treatment: Secondary | ICD-10-CM

## 2017-09-21 DIAGNOSIS — R339 Retention of urine, unspecified: Secondary | ICD-10-CM

## 2017-09-21 DIAGNOSIS — J989 Respiratory disorder, unspecified: Secondary | ICD-10-CM

## 2017-09-21 DIAGNOSIS — Z978 Presence of other specified devices: Secondary | ICD-10-CM

## 2017-09-21 DIAGNOSIS — K8020 Calculus of gallbladder without cholecystitis without obstruction: Secondary | ICD-10-CM

## 2017-09-21 DIAGNOSIS — Z951 Presence of aortocoronary bypass graft: Secondary | ICD-10-CM

## 2017-09-21 LAB — COMPREHENSIVE METABOLIC PANEL
ALT: 39 U/L (ref 17–63)
AST: 33 U/L (ref 15–41)
Albumin: 1.9 g/dL — ABNORMAL LOW (ref 3.5–5.0)
Alkaline Phosphatase: 435 U/L — ABNORMAL HIGH (ref 38–126)
Anion gap: 11 (ref 5–15)
BUN: 75 mg/dL — AB (ref 6–20)
CHLORIDE: 111 mmol/L (ref 101–111)
CO2: 25 mmol/L (ref 22–32)
CREATININE: 2.52 mg/dL — AB (ref 0.61–1.24)
Calcium: 8.5 mg/dL — ABNORMAL LOW (ref 8.9–10.3)
GFR calc Af Amer: 30 mL/min — ABNORMAL LOW (ref >60)
GFR, EST NON AFRICAN AMERICAN: 26 mL/min — AB (ref >60)
Glucose, Bld: 203 mg/dL — ABNORMAL HIGH (ref 65–99)
Potassium: 3.8 mmol/L (ref 3.5–5.1)
Sodium: 147 mmol/L — ABNORMAL HIGH (ref 135–145)
Total Bilirubin: 1.5 mg/dL — ABNORMAL HIGH (ref 0.3–1.2)
Total Protein: 6 g/dL — ABNORMAL LOW (ref 6.5–8.1)

## 2017-09-21 LAB — CBC
HCT: 33.7 % — ABNORMAL LOW (ref 39.0–52.0)
Hemoglobin: 10.4 g/dL — ABNORMAL LOW (ref 13.0–17.0)
MCH: 26.3 pg (ref 26.0–34.0)
MCHC: 30.9 g/dL (ref 30.0–36.0)
MCV: 85.3 fL (ref 78.0–100.0)
PLATELETS: 262 10*3/uL (ref 150–400)
RBC: 3.95 MIL/uL — AB (ref 4.22–5.81)
RDW: 18.8 % — ABNORMAL HIGH (ref 11.5–15.5)
WBC: 10.7 10*3/uL — AB (ref 4.0–10.5)

## 2017-09-21 LAB — GLUCOSE, CAPILLARY
GLUCOSE-CAPILLARY: 170 mg/dL — AB (ref 65–99)
GLUCOSE-CAPILLARY: 171 mg/dL — AB (ref 65–99)
GLUCOSE-CAPILLARY: 99 mg/dL (ref 65–99)
GLUCOSE-CAPILLARY: 163 mg/dL — AB (ref 65–99)
Glucose-Capillary: 132 mg/dL — ABNORMAL HIGH (ref 65–99)
Glucose-Capillary: 170 mg/dL — ABNORMAL HIGH (ref 65–99)

## 2017-09-21 MED ORDER — QUETIAPINE FUMARATE 50 MG PO TABS
50.0000 mg | ORAL_TABLET | Freq: Every day | ORAL | Status: DC
  Administered 2017-09-21 – 2017-09-23 (×3): 50 mg via ORAL
  Filled 2017-09-21 (×3): qty 1

## 2017-09-21 MED ORDER — "THROMBI-PAD 3""X3"" EX PADS"
1.0000 | MEDICATED_PAD | Freq: Once | CUTANEOUS | Status: AC
  Administered 2017-09-21: 1 via TOPICAL
  Filled 2017-09-21: qty 1

## 2017-09-21 NOTE — Progress Notes (Signed)
RT called due to pt desat after suction to 89-90%.  RT entered and suctioned pt removing copious amounts of frank red blood from tracheal suction.  Once suctioning was complete, a large clot had to be removed from the vent circuit by breaking the circuit and removing it by hand.  When RT left, RN was suctioning pt's mouth removing blood as well.  RT left pt with sats at 99% and VS stable.  RT will continue to monitor.

## 2017-09-21 NOTE — Progress Notes (Signed)
PULMONARY / CRITICAL CARE MEDICINE   Name: Mark Martin MRN: [id number] DOB: 1955-08-30    ADMISSION DATE:  09/05/2017   REFERRING MD: Emergency department physician  CHIEF COMPLAINT: Cardiac arrest  BRIEF  Mark Martin is a 62 year old male with known ischemic cardiomyopathy, ejection fraction less than 20%, with an AICD in place.  Was in his usual state of health until 09/05/2017 when he had witnessed arrest.  Family started CPR EMS arrived and ROSC within 25 minutes with 1 dose of epinephrine.  His AICD was interrogated and showed 25 minutes of VT in PEA.  His AICD did shock him x1. He is transferred to [facility] emergency department where he was intubated and started on epinephrine drip.  He continued to have multiple episodes of PEA requiring increasing doses of epinephrine. PCCM called for admission and hypothermia protocol.     STUDIES:  09/05/2017 2D echo >>>EF 15%, possible early forming thrombus, severe diffuse hypokinesis, grade 2 diastolic dysfunction, PA peak pressure 30mmHg 09/05/2017 CT of the head >>>neg acute  1/15 EEG>>> diffuse slowing; non specific etiology patter. No epileptiform discharge. 1/19: CT abd/pelvis 1. Marked distension of the urinary bladder measuring approximately 1370 cc. There is bilateral pelvocaliectasis and mild hydroureter. Correlate for any clinical signs or symptoms of bladder outlet obstruction. 2. Mild prostate gland enlargement. 3. Bilateral pleural effusions and overlying areas of atelectasis and airspace consolidation. Cannot rule out pneumonia. 4.  Aortic Atherosclerosis (ICD10-I70.0). 5. Gallstone identified within the neck of gallbladder. Renal US 1/20: Mild hydronephrosis on the left.  No hydronephrosis on the right.  CULTURES: Blood 1/12>>>NTD Sputum 1/12>>> normal flora  Urine culture 117 2019-negative 09/12/2017 blood cultures x2>> 09/12/2016 sputum culture>>neg   ANTIBIOTICS: Rocephin 1/12>>>off  09/13/2017  Zosyn>>1/24 09/13/2017 vancomycin>>1/21  LINES/TUBES: 09/05/2017 ETT>> 1/24 tstomy 09/05/2017 R fem CVL>>> out 09/05/2017 right femoral aline>>> out Right double-lumen PICC basilic vein 6/31/4970>> 09/14/2017 Foley catheter placed for bladder obstruction>>  SIGNIFICANT EVENTS: 09/05/2017 VT PEA arrest 09/08/17 - Pressor demand decreasing, no events overnight 09/09/17 - worsening AKI. Family at beside. On vent. Daughter at bedside and wife report less responsiveness x 48h/ Last prn sedation of fent/versed on 09/06/17 - per RN moves all 4s, non purposeful and weaning on SBT. Off pressors. Has femoral lines 1/16 - No acute change overnight.  Tolerating pressure support wean 5/5.  Looks good from respiratory standpoint.  More awake but not following commands. 1/18- Weaning this am on 30%, 5/5, pulling good volumes with prn fentanyl and versed. MAE x 4, but does not follow commands 09/14/2017 intermittently follow commands. 09/14/2017 was found to have bladder obstruction on CT of the abdomen.  Foley was placed.  SUBJECTIVE/OVERNIGHT/INTERVAL HX  agitated , moving all 4 Es 'not directible Bleeding around tstomy site  VITAL SIGNS: BP 111/72   Pulse 92   Temp 99.9 F (37.7 C) (Oral)   Resp (!) 24   Ht 5\' 6"  (1.676 m)   Wt 171 lb 1.2 oz (77.6 kg)   SpO2 96%   BMI 27.61 kg/m   HEMODYNAMICS:    VENTILATOR SETTINGS: Vent Mode: PRVC FiO2 (%):  [35 %-40 %] 40 % Set Rate:  [14 bmp] 14 bmp Vt Set:  [500 mL] 500 mL PEEP:  [5 cmH20] 5 cmH20 Plateau Pressure:  [15 cmH20-23 cmH20] 20 cmH20  INTAKE / OUTPUT:  Intake/Output Summary (Last 24 hours) at 09/21/2017 1015 Last data filed at 09/21/2017 2637 Gross per 24 hour  Intake 1470 ml  Output 1050 ml  Net 420 ml  PHYSICAL EXAMINATION:  General: elderlypatient, currently awake on ventilator.  mild distress. HEENT: #6 cuffed tracheostomy with bloody secretions, some frothy bloody secretions noted in the tracheostomy tube. Pulmonary:  Scattered rhonchi, no accessory use, Cardiac: Holosystolic murmur, paced rhythm on telemetry Abdomen: Soft nontender Extremities: Moderate diffuse anasarca, brisk cap refill, warm, dry GU: Clear yellow Neuro: Awake, non sp agitation,, moves extremities, intermittently follows commands per previous notes, not today (? language barrier)  PULMONARY Recent Labs  Lab 09/15/17 1200 09/15/17 2125 09/16/17 2228 09/19/17 0415  PHART  --  7.453* 7.451* 7.430  PCO2ART  --  41.5 46.3 43.1  PO2ART  --  111* 250.0* 95.1  HCO3  --  28.6* 32.3* 28.1*  TCO2  --   --  34*  --   O2SAT 58.1 98.0 100.0 97.2    CBC Recent Labs  Lab 09/18/17 0403 09/19/17 0402 09/21/17 0826  HGB 11.9* 11.0* 10.4*  HCT 37.1* 35.0* 33.7*  WBC 15.4* 11.8* 10.7*  PLT 186 205 262    COAGULATION Recent Labs  Lab 09/19/17 0402  INR 1.41    CARDIAC   No results for input(s): TROPONINI in the last 168 hours. No results for input(s): PROBNP in the last 168 hours.   CHEMISTRY Recent Labs  Lab 09/16/17 0518 09/17/17 0243 09/17/17 1834 09/17/17 2141 09/18/17 0403 09/18/17 1824 09/19/17 0402 09/21/17 0826  NA 148* 140  --  141 148*  --  145 147*  K 4.2 3.1*  --  3.2* 3.9  --  3.5 3.8  CL 107 102  --  104 110  --  107 111  CO2 26 26  --  25 26  --  27 25  GLUCOSE 214* 425*  --  370* 139*  --  140* 203*  BUN 106* 80*  --  82* 78*  --  78* 75*  CREATININE 2.86* 2.73*  --  2.58* 2.67*  --  2.26* 2.52*  CALCIUM 8.4* 8.0*  --  8.0* 8.4*  --  8.3* 8.5*  MG 2.1  --  2.2  --  2.2 2.2 2.2  --   PHOS 5.0*  --  4.6  --  3.9 3.5 3.8  --    Estimated Creatinine Clearance: 29.4 mL/min (A) (by C-G formula based on SCr of 2.52 mg/dL (H)).  LIVER Recent Labs  Lab 09/17/17 0243 09/18/17 0403 09/19/17 0402 09/21/17 0826  AST 35 53*  --  33  ALT 59 63  --  39  ALKPHOS 271* 391*  --  435*  BILITOT 1.3* 1.1  --  1.5*  PROT 5.4* 5.7*  --  6.0*  ALBUMIN 1.8* 1.9*  --  1.9*  INR  --   --  1.41  --     INFECTIOUS Recent Labs  Lab 09/15/17 0339  LATICACIDVEN 1.4   ENDOCRINE CBG (last 3)  Recent Labs    09/20/17 2339 09/21/17 0344 09/21/17 0724  GLUCAP 199* 132* 170*   IMAGING x48h  - image(s) personally visualized  -   highlighted in bold Dg Abd Portable 1v  Result Date: 09/20/2017 CLINICAL DATA:  Feeding tube placement. EXAM: PORTABLE ABDOMEN - 1 VIEW COMPARISON:  09/20/2017 at 0657 hours FINDINGS: The patient is rotated to the right. A feeding tube has been placed and is partially coiled in the right upper abdomen, likely in the distal stomach. Gas is present in nondilated loops of small and large bowel without evidence of obstruction. Sternotomy wires and ICD are partially visualized. Lumbar  spondylosis is noted. IMPRESSION: Feeding tube in the distal stomach. Electronically Signed   By: Logan Bores M.D.   On: 09/20/2017 09:38   Dg Abd Portable 1v  Result Date: 09/20/2017 CLINICAL DATA:  NG tube placement. EXAM: PORTABLE ABDOMEN - 1 VIEW COMPARISON:  09/20/2017.  CT 09/14/2017. FINDINGS: NG tube is again noted looped in the distal esophagus with tip in the esophagus. Repositioning should be considered. Gallstone noted. No bowel distention. No free air. Prior scratched it prior median sternotomy. AICD noted. IMPRESSION: NG tube is again noted looped in the distal esophagus, repositioning should be considered. These results will be called to the ordering clinician or representative by the Radiologist Assistant, and communication documented in the PACS or zVision Dashboard. Electronically Signed   By: Marcello Moores  Register   On: 09/20/2017 08:45   Dg Abd Portable 1v  Result Date: 09/20/2017 CLINICAL DATA:  Nasogastric tube placement EXAM: PORTABLE ABDOMEN - 1 VIEW COMPARISON:  September 19, 2017 abdominal radiograph and CT abdomen and pelvis September 14, 2017 FINDINGS: Nasogastric tube extends to the gastroesophageal junction, where it loops with the tip directed into the esophagus. There is  no bowel dilatation or air-fluid levels to suggest bowel obstruction. No free air. There is a laminated gallstone in the right abdomen measuring 1.2 x 1.0 cm. IMPRESSION: Nasogastric tube loops upon itself at the gastroesophageal junction with the tip directed into the distal esophagus. No bowel obstruction or free air. Laminated gallstone right upper abdomen. Electronically Signed   By: Lowella Grip III M.D.   On: 09/20/2017 07:36   Dg Abd Portable 1v  Result Date: 09/20/2017 CLINICAL DATA:  NG tube placement EXAM: PORTABLE ABDOMEN - 1 VIEW COMPARISON:  CT 09/14/2017, radiograph 09/13/2017 FINDINGS: Partially visualized sternotomy and cardiac pacing leads. Visible gas pattern nonobstructed. Calcified gallstone in the right upper quadrant. Esophageal tube tip projects over the proximal stomach, side-port in the region of GE junction. IMPRESSION: 1. Esophageal tube tip overlies proximal stomach, side-port in the region of GE junction, further advancement could be considered for more optimal positioning 2. Gallstone Electronically Signed   By: Donavan Foil M.D.   On: 09/20/2017 00:28   ASSESSMENT / PLAN:  s/p VT/VF arrest in setting of NSTEMI Ischemic cardiomyopathy - EF 15%, AICD in place  Cardiogenic shock (resolved) Atrial fib  plan Continue aspirin Hold Plavix  Continue amiodarone Holding ARB/ACE inhibitor due to renal dysfunction Holding beta-blockade Off IV heparin   Anoxic encephalopathy status post PEA arrest w/ ICU related physical  deconditioning  -has made some improvement Plan Continue supportive care Continue routine delirium interventions May add seroquel 50  Dysphagia  Plan Continue tube feeds May require PEG   acute respiratory failure s/p cardiac arrest & now vent dependent d/t inability to protect airway Tracheostomy  1/24 - bleeding   Plan Daily trach collar trialing; nocturnal vent for now  Pressure support as indicated Tstomy dressing changed & placed  below phlange to tamponade bleeding Mobilize Will need SLP evaluation once secretions will allow for Passy-Muir valve trials  AKI: initial insult:  cardiac arrest but then complicated by diuresis and Urinary retention w/ MIld right hydronephrosis   on 1/19 w/ MIld Hydronephrosis identified via f/c on CT of the abdomen.  -f/c placed Urology called, nothing to add Plan Continue doxazosin Lasix intermittently  Mild  hypernatremia  Plan Continue  free water    Elevated LFTs -resolved  Fever w/ leukocytosis on 1/17 with elevated pro-calcitonin >source unclear UC neg, BC neg  sputum neg Plan Completed 7 days of antibiotics   Hyperglycemia  Plan ssi and basal dosing   FAMILY  - Updates: No family bedside   - Inter-disciplinary family meet or Palliative Care meeting due by:  day 7  DVT prophylaxis: scd SUP: ppi Diet: tubefeed Activity: br Disposition : ICU   The patient is critically ill with multiple organ systems failure and requires high complexity decision making for assessment and support, frequent evaluation and titration of therapies, application of advanced monitoring technologies and extensive interpretation of multiple databases. Critical Care Time devoted to patient care services described in this note independent of APP/resident  time is 31 minutes.   Leanna Sato Elsworth Soho MD

## 2017-09-21 NOTE — Progress Notes (Signed)
PROGRESS NOTE  Mark Martin FXT:[id number] DOB: [birth year] DOA: 09/05/2017 PCP: Garwin Brothers, MD  HPI/Recap of past 24 hours: Mark Martin is a 62 year old male with ischemic CM EF 15%, with an AICD in place.  Was in his usual state of health until 09/05/2017 when he had witnessed arrest. His AICD did shock him x1.Brought to [facility] ED where he was intubated and started on epinephrine drip.  He continued to have multiple episodes of PEA requiring increasing doses of epinephrine. Admitted to ICU to continue his care. He is now vent dependent due to inability to protect his airway. Had tracheostomy 09/19/17.  09/21/17: Medicine asked to continue care of the patient. Patient seen and examined with no family members at his bedside. He is very drowsy. Received versed last night. Does not follow any commands however is able to move all 4 extremities non purposefully. Bleeding from trach collar.    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 Dr Roxy Manns   Cardiac arrest [facility])   Non-STEMI (non-ST elevated myocardial infarction) [facility])   Ischemic cardiomyopathy   ICD (implantable cardioverter-defibrillator) discharge   Coronary artery disease involving coronary bypass graft of native heart with angina pectoris ([place])   Acute on chronic combined systolic and diastolic heart failure, NYHA class 4 (HCC)   Acute renal failure with acute cortical necrosis (HCC)   Cardiogenic shock ([place]) --resolved   Anoxic brain injury ([place])   Respiratory failure ([place])   Palliative care encounter   Goals of care, counseling/discussion   Dysphagia  Cardiac arrest in the setting of NSTEMI -post hypothermia protocol on admission -cardiology following  Ischemic CM - EF 15%, -AICD in place  -continue asa, lipitor, amiodarone  Anoxic encephalopathy status post PEA arrest complicated by ICU related physical  deconditioning  -persistent -continue close monitoring  Dysphagia  -Continue tube feeds -will  contact family regarding peg tube placement  Acute respiratory failure s/p cardiac arrest post tracheostomy   -vent dependent -continue vent at night -CCM managing vent -continuous pulse ox monitoring -tracheostomy 09/19/17 -bleeding from trach collar  AKI on CKD 3 -cr 2.26 baseline 1.3 -avoid nephrotoxic agents/hypotension  Urinary retention -mild hydronephrosis on Ct abd -foley in place -monitor urine output -doxazosin, lasix  Hyperglycemia  -a1c 8.9 (06/05/13) -ssi and basal dosing  -a1c, BMP am   Code Status: full  Family Communication: none at bedside   Disposition Plan: will stay another midnight to continue current management. Vent dependent.   Consultants:  CCM  Procedures:  tracheostomy  Antimicrobials:  none  DVT prophylaxis:  SCDs   Objective: Vitals:   09/21/17 0500 09/21/17 0600 09/21/17 0700 09/21/17 0727  BP: 110/72 110/72 108/72   Pulse: 88 89 92   Resp: (!) 23 20 (!) 26   Temp:    99.9 F (37.7 C)  TempSrc:    Oral  SpO2: 97% 100% 97%   Weight:      Height:        Intake/Output Summary (Last 24 hours) at 09/21/2017 0749 Last data filed at 09/21/2017 2992 Gross per 24 hour  Intake 1730.5 ml  Output 1145 ml  Net 585.5 ml   Filed Weights   09/19/17 0400 09/20/17 0500 09/21/17 0400  Weight: 77.1 kg (169 lb 15.6 oz) 78 kg (171 lb 15.3 oz) 77.6 kg (171 lb 1.2 oz)    Exam:   General:  62 yo male WD WN NAD not responding to commands. Trach collar bleeding.  Cardiovascular: RRR no rubs or gallops  Respiratory: Mild diffused rales bilaterally. No wheezes  Abdomen: non distended NBS x4  Musculoskeletal: non focal  Skin: no rash noted  Psychiatry: unable to assess due to somnolence   Data Reviewed: CBC: Recent Labs  Lab 09/15/17 0339 09/16/17 0518 09/17/17 0243 09/18/17 0403 09/19/17 0402  WBC 14.8* 20.3* 11.8* 15.4* 11.8*  NEUTROABS 12.0* 17.3*  --   --   --   HGB 12.7* 13.5 11.1* 11.9* 11.0*  HCT 39.8  42.4 35.9* 37.1* 35.0*  MCV 83.6 83.8 85.1 83.6 83.9  PLT 128* 175 143* 186 268   Basic Metabolic Panel: Recent Labs  Lab 09/16/17 0518 09/17/17 0243 09/17/17 1834 09/17/17 2141 09/18/17 0403 09/18/17 1824 09/19/17 0402  NA 148* 140  --  141 148*  --  145  K 4.2 3.1*  --  3.2* 3.9  --  3.5  CL 107 102  --  104 110  --  107  CO2 26 26  --  25 26  --  27  GLUCOSE 214* 425*  --  370* 139*  --  140*  BUN 106* 80*  --  82* 78*  --  78*  CREATININE 2.86* 2.73*  --  2.58* 2.67*  --  2.26*  CALCIUM 8.4* 8.0*  --  8.0* 8.4*  --  8.3*  MG 2.1  --  2.2  --  2.2 2.2 2.2  PHOS 5.0*  --  4.6  --  3.9 3.5 3.8   GFR: Estimated Creatinine Clearance: 32.8 mL/min (A) (by C-G formula based on SCr of 2.26 mg/dL (H)). Liver Function Tests: Recent Labs  Lab 09/17/17 0243 09/18/17 0403  AST 35 53*  ALT 59 63  ALKPHOS 271* 391*  BILITOT 1.3* 1.1  PROT 5.4* 5.7*  ALBUMIN 1.8* 1.9*   No results for input(s): LIPASE, AMYLASE in the last 168 hours. No results for input(s): AMMONIA in the last 168 hours. Coagulation Profile: Recent Labs  Lab 09/19/17 0402  INR 1.41   Cardiac Enzymes: No results for input(s): CKTOTAL, CKMB, CKMBINDEX, TROPONINI in the last 168 hours. BNP (last 3 results) No results for input(s): PROBNP in the last 8760 hours. HbA1C: No results for input(s): HGBA1C in the last 72 hours. CBG: Recent Labs  Lab 09/20/17 1611 09/20/17 2012 09/20/17 2339 09/21/17 0344 09/21/17 0724  GLUCAP 242* 245* 199* 132* 170*   Lipid Profile: No results for input(s): CHOL, HDL, LDLCALC, TRIG, CHOLHDL, LDLDIRECT in the last 72 hours. Thyroid Function Tests: No results for input(s): TSH, T4TOTAL, FREET4, T3FREE, THYROIDAB in the last 72 hours. Anemia Panel: No results for input(s): VITAMINB12, FOLATE, FERRITIN, TIBC, IRON, RETICCTPCT in the last 72 hours. Urine analysis:    Component Value Date/Time   COLORURINE YELLOW 09/05/2017 1244   APPEARANCEUR HAZY (A) 09/05/2017 1244    LABSPEC 1.019 09/05/2017 1244   PHURINE 5.0 09/05/2017 1244   GLUCOSEU NEGATIVE 09/05/2017 1244   HGBUR SMALL (A) 09/05/2017 1244   BILIRUBINUR NEGATIVE 09/05/2017 [place] 09/05/2017 1244   PROTEINUR 100 (A) 09/05/2017 1244   UROBILINOGEN 0.2 06/04/2013 1130   NITRITE NEGATIVE 09/05/2017 1244   LEUKOCYTESUR NEGATIVE 09/05/2017 1244   Sepsis Labs: _0 (procalcitonin:4,lacticidven:4)  ) Recent Results (from the past 240 hour(s))  Culture, respiratory (NON-Expectorated)     Status: None   Collection Time: 09/12/17  9:57 AM  Result Value Ref Range Status   Specimen Description TRACHEAL ASPIRATE  Final   Special Requests Normal  Final   Gram Stain NO WBC SEEN  NO ORGANISMS SEEN   Final   Culture NO GROWTH 2 DAYS  Final   Report Status 09/14/2017 FINAL  Final  Culture, blood (Routine X 2) w Reflex to ID Panel     Status: None   Collection Time: 09/12/17 10:20 AM  Result Value Ref Range Status   Specimen Description BLOOD RIGHT HAND  Final   Special Requests   Final    BOTTLES DRAWN AEROBIC ONLY Blood Culture adequate volume   Culture NO GROWTH 5 DAYS  Final   Report Status 09/17/2017 FINAL  Final  Culture, blood (Routine X 2) w Reflex to ID Panel     Status: None   Collection Time: 09/12/17 10:20 AM  Result Value Ref Range Status   Specimen Description BLOOD RIGHT HAND  Final   Special Requests IN PEDIATRIC BOTTLE Blood Culture adequate volume  Final   Culture NO GROWTH 5 DAYS  Final   Report Status 09/17/2017 FINAL  Final  Culture, Urine     Status: None   Collection Time: 09/12/17 12:26 PM  Result Value Ref Range Status   Specimen Description URINE, RANDOM  Final   Special Requests Normal  Final   Culture NO GROWTH  Final   Report Status 09/13/2017 FINAL  Final      Studies: Dg Abd Portable 1v  Result Date: 09/20/2017 CLINICAL DATA:  Feeding tube placement. EXAM: PORTABLE ABDOMEN - 1 VIEW COMPARISON:  09/20/2017 at 0657 hours FINDINGS: The  patient is rotated to the right. A feeding tube has been placed and is partially coiled in the right upper abdomen, likely in the distal stomach. Gas is present in nondilated loops of small and large bowel without evidence of obstruction. Sternotomy wires and ICD are partially visualized. Lumbar spondylosis is noted. IMPRESSION: Feeding tube in the distal stomach. Electronically Signed   By: Logan Bores M.D.   On: 09/20/2017 09:38    Scheduled Meds: . amiodarone  200 mg Per Tube BID   Followed by  . [START ON 09/24/2017] amiodarone  200 mg Per Tube Daily  . aspirin  81 mg Per Tube Daily  . atorvastatin  40 mg Per Tube q1800  . chlorhexidine gluconate (MEDLINE KIT)  15 mL Mouth Rinse BID  . Chlorhexidine Gluconate Cloth  6 each Topical Daily  . doxazosin  1 mg Per Tube Daily  . feeding supplement (VITAL HIGH PROTEIN)  1,000 mL Per Tube Q24H  . free water  250 mL Per Tube Q6H  . insulin aspart  0-15 Units Subcutaneous Q4H  . insulin aspart  5 Units Subcutaneous Q4H  . mouth rinse  15 mL Mouth Rinse 10 times per day  . vecuronium  10 mg Intravenous Once    Continuous Infusions: . propofol       LOS: 16 days     Kayleen Memos, MD [facility] Pager [phone]  If 7PM-7AM, please contact night-coverage [email or web address] Password [facility] 09/21/2017, 7:49 AM

## 2017-09-22 DIAGNOSIS — I959 Hypotension, unspecified: Secondary | ICD-10-CM

## 2017-09-22 DIAGNOSIS — N1330 Unspecified hydronephrosis: Secondary | ICD-10-CM

## 2017-09-22 DIAGNOSIS — Z789 Other specified health status: Secondary | ICD-10-CM

## 2017-09-22 LAB — CBC
HCT: 33.8 % — ABNORMAL LOW (ref 39.0–52.0)
Hemoglobin: 10.6 g/dL — ABNORMAL LOW (ref 13.0–17.0)
MCH: 26.8 pg (ref 26.0–34.0)
MCHC: 31.4 g/dL (ref 30.0–36.0)
MCV: 85.6 fL (ref 78.0–100.0)
PLATELETS: 266 10*3/uL (ref 150–400)
RBC: 3.95 MIL/uL — ABNORMAL LOW (ref 4.22–5.81)
RDW: 19 % — AB (ref 11.5–15.5)
WBC: 10.2 10*3/uL (ref 4.0–10.5)

## 2017-09-22 LAB — GLUCOSE, CAPILLARY
Glucose-Capillary: 140 mg/dL — ABNORMAL HIGH (ref 65–99)
Glucose-Capillary: 208 mg/dL — ABNORMAL HIGH (ref 65–99)
Glucose-Capillary: 229 mg/dL — ABNORMAL HIGH (ref 65–99)
Glucose-Capillary: 184 mg/dL — ABNORMAL HIGH (ref 65–99)
Glucose-Capillary: 204 mg/dL — ABNORMAL HIGH (ref 65–99)

## 2017-09-22 LAB — BASIC METABOLIC PANEL
Anion gap: 11 (ref 5–15)
BUN: 72 mg/dL — ABNORMAL HIGH (ref 6–20)
CALCIUM: 8.3 mg/dL — AB (ref 8.9–10.3)
CO2: 24 mmol/L (ref 22–32)
Chloride: 112 mmol/L — ABNORMAL HIGH (ref 101–111)
Creatinine, Ser: 2.11 mg/dL — ABNORMAL HIGH (ref 0.61–1.24)
GFR calc Af Amer: 37 mL/min — ABNORMAL LOW (ref >60)
GFR, EST NON AFRICAN AMERICAN: 32 mL/min — AB (ref >60)
GLUCOSE: 255 mg/dL — AB (ref 65–99)
Potassium: 3.4 mmol/L — ABNORMAL LOW (ref 3.5–5.1)
Sodium: 147 mmol/L — ABNORMAL HIGH (ref 135–145)

## 2017-09-22 MED ORDER — FREE WATER
300.0000 mL | Freq: Four times a day (QID) | Status: DC
  Administered 2017-09-22 – 2017-09-23 (×5): 300 mL

## 2017-09-22 NOTE — Progress Notes (Signed)
62 year old man with ischemic cardiomyopathy and anoxic encephalopathy status post VT arrest. He was following commands after waking up but was delirious last 2 days. Seroquel was increased and he looks much calmer today. He is tolerating trach collar today. He had bleeding around tracheostomy site and thrombin pad was used and dressing change with some tamponade achieved.  RN states some dark blood coming out of tracheostomy suctioning but otherwise bleeding seems to have stopped. Goals of care discussion been continued with family, he is stable to transfer to stepdown unit, would advocate for no CPR/cardioversion -Full medical care to see if he can improve with rehab and come off vent completely    Scout Guyett V. Elsworth Soho MD

## 2017-09-22 NOTE — Progress Notes (Signed)
PROGRESS NOTE  Mark Martin BEE:[id number] DOB: 1956-03-09 DOA: 09/05/2017 PCP: Garwin Brothers, MD  HPI/Recap of past 24 hours: Mark Martin is a 62 year old male with ischemic CM EF 15%, with an AICD in place.  Was in his usual state of health until 09/05/2017 when he had witnessed arrest. His AICD did shock him x1.Brought to [facility] ED where he was intubated and started on epinephrine drip.  He continued to have multiple episodes of PEA requiring increasing doses of epinephrine. Admitted to ICU to continue his care. He is now vent dependent due to inability to protect his airway. Had tracheostomy 09/19/17. 09/21/17: Medicine asked to continue care of the patient. Bleeding trach collar.  09/22/17: seen and examined at his bedside. Does not open eyes does not follow commands. Called the patient's wife at this number [phone]. No answers and no calls back. Palliative care team consulted.   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 Dr Mark Martin   Cardiac arrest [facility])   Non-STEMI (non-ST elevated myocardial infarction) ([place])   Ischemic cardiomyopathy   ICD (implantable cardioverter-defibrillator) discharge   Coronary artery disease involving coronary bypass graft of native heart with angina pectoris ([place])   Acute on chronic combined systolic and diastolic heart failure, NYHA class 4 ([place])   Acute renal failure with acute cortical necrosis ([place])   Cardiogenic shock ([place]) --resolved   Anoxic brain injury ([place])   Respiratory failure ([place])   Palliative care encounter   Goals of care, counseling/discussion   Dysphagia  Cardiac arrest in the setting of NSTEMI -post hypothermia protocol on admission -cardiology following -Close monitoring on telemetry  Ischemic CM - EF 15%, -AICD in place  -continue asa, lipitor, amiodarone -close monitoring -cardiology following -Full code-writer called wife to discuss goals of care; no calls back.  Anoxic encephalopathy status post PEA arrest  complicated by ICU related physical  deconditioning  -persistent -continue neurochecks  Dysphagia  -Continue tube feeds -will contact family regarding peg tube placement  Acute respiratory failure s/p cardiac arrest post tracheostomy   -vent dependent -continue vent at night -CCM managing vent -continuous pulse ox monitoring -tracheostomy 09/19/17 -improved bleeding from trach collar  AKI on CKD 3, improving -cr 2.11 from 2.26 baseline 1.3 -avoid nephrotoxic agents/hypotension  Urinary retention -mild hydronephrosis on Ct abd -foley in place -monitor urine output -doxazosin, lasix  Hyperglycemia  -a1c 8.9 (06/05/13) -ssi and basal dosing  -a1c, BMP am   Code Status: full  Family Communication: none at bedside   Disposition Plan: will stay another midnight to continue current management. Vent dependent.   Consultants:  CCM  Procedures:  tracheostomy  Antimicrobials:  none  DVT prophylaxis:  SCDs   Objective: Vitals:   09/22/17 0400 09/22/17 0500 09/22/17 0600 09/22/17 0700  BP: 124/75 116/74 111/71 116/76  Pulse: 84 81 81 82  Resp: (!) _0 Temp: 98.5 F (36.9 C)   98.9 F (37.2 C)  TempSrc: Oral   Oral  SpO2: 100% 99% 100% 98%  Weight:  78.2 kg (172 lb 6.4 oz)    Height:        Intake/Output Summary (Last 24 hours) at 09/22/2017 0750 Last data filed at 09/22/2017 0600 Gross per 24 hour  Intake 2861.5 ml  Output 1405 ml  Net 1456.5 ml   Filed Weights   09/20/17 0500 09/21/17 0400 09/22/17 0500  Weight: 78 kg (171 lb 15.3 oz) 77.6 kg (171 lb 1.2 oz) 78.2 kg (172 lb 6.4 oz)  Exam: 09/22/17 patient was seen and examined.   General:  62 yo male WD WN NAD not responding to commands. Trach collar in place.  Cardiovascular: RRR no rubs or gallops  Respiratory: Mild diffused rales bilaterally. No wheezes  Abdomen: non distended NBS x4  Musculoskeletal: does not follow commands  Skin: no rash noted  Psychiatry: unable  to assess due to somnolence   Data Reviewed: CBC: Recent Labs  Lab 09/16/17 0518 09/17/17 0243 09/18/17 0403 09/19/17 0402 09/21/17 0826  WBC 20.3* 11.8* 15.4* 11.8* 10.7*  NEUTROABS 17.3*  --   --   --   --   HGB 13.5 11.1* 11.9* 11.0* 10.4*  HCT 42.4 35.9* 37.1* 35.0* 33.7*  MCV 83.8 85.1 83.6 83.9 85.3  PLT 175 143* 186 205 767   Basic Metabolic Panel: Recent Labs  Lab 09/16/17 0518 09/17/17 0243 09/17/17 1834 09/17/17 2141 09/18/17 0403 09/18/17 1824 09/19/17 0402 09/21/17 0826  NA 148* 140  --  141 148*  --  145 147*  K 4.2 3.1*  --  3.2* 3.9  --  3.5 3.8  CL 107 102  --  104 110  --  107 111  CO2 26 26  --  25 26  --  27 25  GLUCOSE 214* 425*  --  370* 139*  --  140* 203*  BUN 106* 80*  --  82* 78*  --  78* 75*  CREATININE 2.86* 2.73*  --  2.58* 2.67*  --  2.26* 2.52*  CALCIUM 8.4* 8.0*  --  8.0* 8.4*  --  8.3* 8.5*  MG 2.1  --  2.2  --  2.2 2.2 2.2  --   PHOS 5.0*  --  4.6  --  3.9 3.5 3.8  --    GFR: Estimated Creatinine Clearance: 29.5 mL/min (A) (by C-G formula based on SCr of 2.52 mg/dL (H)). Liver Function Tests: Recent Labs  Lab 09/17/17 0243 09/18/17 0403 09/21/17 0826  AST 35 53* 33  ALT 59 63 39  ALKPHOS 271* 391* 435*  BILITOT 1.3* 1.1 1.5*  PROT 5.4* 5.7* 6.0*  ALBUMIN 1.8* 1.9* 1.9*   No results for input(s): LIPASE, AMYLASE in the last 168 hours. No results for input(s): AMMONIA in the last 168 hours. Coagulation Profile: Recent Labs  Lab 09/19/17 0402  INR 1.41   Cardiac Enzymes: No results for input(s): CKTOTAL, CKMB, CKMBINDEX, TROPONINI in the last 168 hours. BNP (last 3 results) No results for input(s): PROBNP in the last 8760 hours. HbA1C: No results for input(s): HGBA1C in the last 72 hours. CBG: Recent Labs  Lab 09/21/17 1512 09/21/17 1938 09/21/17 2321 09/22/17 0414 09/22/17 0724  GLUCAP 170* 99 163* 140* 208*   Lipid Profile: No results for input(s): CHOL, HDL, LDLCALC, TRIG, CHOLHDL, LDLDIRECT in the last  72 hours. Thyroid Function Tests: No results for input(s): TSH, T4TOTAL, FREET4, T3FREE, THYROIDAB in the last 72 hours. Anemia Panel: No results for input(s): VITAMINB12, FOLATE, FERRITIN, TIBC, IRON, RETICCTPCT in the last 72 hours. Urine analysis:    Component Value Date/Time   COLORURINE YELLOW 09/05/2017 1244   APPEARANCEUR HAZY (A) 09/05/2017 1244   LABSPEC 1.019 09/05/2017 1244   PHURINE 5.0 09/05/2017 1244   GLUCOSEU NEGATIVE 09/05/2017 1244   HGBUR SMALL (A) 09/05/2017 1244   BILIRUBINUR NEGATIVE 09/05/2017 1244   KETONESUR NEGATIVE 09/05/2017 1244   PROTEINUR 100 (A) 09/05/2017 1244   UROBILINOGEN 0.2 06/04/2013 1130   NITRITE NEGATIVE 09/05/2017 1244   LEUKOCYTESUR NEGATIVE 09/05/2017  1244   Sepsis Labs: _0 (procalcitonin:4,lacticidven:4)  ) Recent Results (from the past 240 hour(s))  Culture, respiratory (NON-Expectorated)     Status: None   Collection Time: 09/12/17  9:57 AM  Result Value Ref Range Status   Specimen Description TRACHEAL ASPIRATE  Final   Special Requests Normal  Final   Gram Stain NO WBC SEEN NO ORGANISMS SEEN   Final   Culture NO GROWTH 2 DAYS  Final   Report Status 09/14/2017 FINAL  Final  Culture, blood (Routine X 2) w Reflex to ID Panel     Status: None   Collection Time: 09/12/17 10:20 AM  Result Value Ref Range Status   Specimen Description BLOOD RIGHT HAND  Final   Special Requests   Final    BOTTLES DRAWN AEROBIC ONLY Blood Culture adequate volume   Culture NO GROWTH 5 DAYS  Final   Report Status 09/17/2017 FINAL  Final  Culture, blood (Routine X 2) w Reflex to ID Panel     Status: None   Collection Time: 09/12/17 10:20 AM  Result Value Ref Range Status   Specimen Description BLOOD RIGHT HAND  Final   Special Requests IN PEDIATRIC BOTTLE Blood Culture adequate volume  Final   Culture NO GROWTH 5 DAYS  Final   Report Status 09/17/2017 FINAL  Final  Culture, Urine     Status: None   Collection Time: 09/12/17 12:26 PM    Result Value Ref Range Status   Specimen Description URINE, RANDOM  Final   Special Requests Normal  Final   Culture NO GROWTH  Final   Report Status 09/13/2017 FINAL  Final      Studies: No results found.  Scheduled Meds: . amiodarone  200 mg Per Tube BID   Followed by  . [START ON 09/24/2017] amiodarone  200 mg Per Tube Daily  . aspirin  81 mg Per Tube Daily  . atorvastatin  40 mg Per Tube q1800  . chlorhexidine gluconate (MEDLINE KIT)  15 mL Mouth Rinse BID  . Chlorhexidine Gluconate Cloth  6 each Topical Daily  . doxazosin  1 mg Per Tube Daily  . feeding supplement (VITAL HIGH PROTEIN)  1,000 mL Per Tube Q24H  . free water  250 mL Per Tube Q6H  . insulin aspart  0-15 Units Subcutaneous Q4H  . insulin aspart  5 Units Subcutaneous Q4H  . mouth rinse  15 mL Mouth Rinse 10 times per day  . QUEtiapine  50 mg Oral Daily    Continuous Infusions:    LOS: 17 days     Kayleen Memos, MD [facility] Pager [phone]  If 7PM-7AM, please contact night-coverage [email or web address] Password [email or web address] 09/22/2017, 7:50 AM

## 2017-09-22 NOTE — Plan of Care (Signed)
Off restraints only on safety mitts.  Progressing resp, trach collar during day, vent at night. Tolerating tube feedings, having some liquid stool. Neuro following commands. Red sputum, coughing and expectorating.

## 2017-09-23 ENCOUNTER — Inpatient Hospital Stay (HOSPITAL_COMMUNITY): Payer: Medicare Other

## 2017-09-23 ENCOUNTER — Inpatient Hospital Stay
Admission: EM | Admit: 2017-09-23 | Discharge: 2017-10-25 | Disposition: E | Payer: Medicare Other | Attending: Internal Medicine | Admitting: Internal Medicine

## 2017-09-23 ENCOUNTER — Other Ambulatory Visit (HOSPITAL_COMMUNITY): Payer: Medicare Other

## 2017-09-23 DIAGNOSIS — J9690 Respiratory failure, unspecified, unspecified whether with hypoxia or hypercapnia: Secondary | ICD-10-CM | POA: Diagnosis not present

## 2017-09-23 DIAGNOSIS — J9621 Acute and chronic respiratory failure with hypoxia: Secondary | ICD-10-CM | POA: Diagnosis present

## 2017-09-23 DIAGNOSIS — Z789 Other specified health status: Secondary | ICD-10-CM | POA: Diagnosis not present

## 2017-09-23 DIAGNOSIS — N179 Acute kidney failure, unspecified: Secondary | ICD-10-CM

## 2017-09-23 DIAGNOSIS — N1330 Unspecified hydronephrosis: Secondary | ICD-10-CM | POA: Diagnosis not present

## 2017-09-23 DIAGNOSIS — Z4659 Encounter for fitting and adjustment of other gastrointestinal appliance and device: Secondary | ICD-10-CM

## 2017-09-23 DIAGNOSIS — R918 Other nonspecific abnormal finding of lung field: Secondary | ICD-10-CM | POA: Diagnosis not present

## 2017-09-23 DIAGNOSIS — J9811 Atelectasis: Secondary | ICD-10-CM | POA: Diagnosis not present

## 2017-09-23 DIAGNOSIS — E1122 Type 2 diabetes mellitus with diabetic chronic kidney disease: Secondary | ICD-10-CM | POA: Diagnosis present

## 2017-09-23 DIAGNOSIS — N19 Unspecified kidney failure: Secondary | ICD-10-CM | POA: Diagnosis not present

## 2017-09-23 DIAGNOSIS — Z992 Dependence on renal dialysis: Secondary | ICD-10-CM

## 2017-09-23 DIAGNOSIS — R609 Edema, unspecified: Secondary | ICD-10-CM

## 2017-09-23 DIAGNOSIS — I255 Ischemic cardiomyopathy: Secondary | ICD-10-CM | POA: Diagnosis present

## 2017-09-23 DIAGNOSIS — J9620 Acute and chronic respiratory failure, unspecified whether with hypoxia or hypercapnia: Secondary | ICD-10-CM | POA: Diagnosis not present

## 2017-09-23 DIAGNOSIS — Z4901 Encounter for fitting and adjustment of extracorporeal dialysis catheter: Secondary | ICD-10-CM | POA: Diagnosis not present

## 2017-09-23 DIAGNOSIS — K8020 Calculus of gallbladder without cholecystitis without obstruction: Secondary | ICD-10-CM | POA: Diagnosis not present

## 2017-09-23 DIAGNOSIS — Z8674 Personal history of sudden cardiac arrest: Secondary | ICD-10-CM | POA: Diagnosis not present

## 2017-09-23 DIAGNOSIS — G9340 Encephalopathy, unspecified: Secondary | ICD-10-CM | POA: Diagnosis not present

## 2017-09-23 DIAGNOSIS — I5042 Chronic combined systolic (congestive) and diastolic (congestive) heart failure: Secondary | ICD-10-CM | POA: Diagnosis present

## 2017-09-23 DIAGNOSIS — L89010 Pressure ulcer of right elbow, unstageable: Secondary | ICD-10-CM | POA: Diagnosis present

## 2017-09-23 DIAGNOSIS — J9601 Acute respiratory failure with hypoxia: Secondary | ICD-10-CM

## 2017-09-23 DIAGNOSIS — N171 Acute kidney failure with acute cortical necrosis: Secondary | ICD-10-CM | POA: Diagnosis not present

## 2017-09-23 DIAGNOSIS — R339 Retention of urine, unspecified: Secondary | ICD-10-CM | POA: Diagnosis not present

## 2017-09-23 DIAGNOSIS — R0603 Acute respiratory distress: Secondary | ICD-10-CM | POA: Diagnosis not present

## 2017-09-23 DIAGNOSIS — I5043 Acute on chronic combined systolic (congestive) and diastolic (congestive) heart failure: Secondary | ICD-10-CM | POA: Diagnosis not present

## 2017-09-23 DIAGNOSIS — I5022 Chronic systolic (congestive) heart failure: Secondary | ICD-10-CM | POA: Diagnosis not present

## 2017-09-23 DIAGNOSIS — I25709 Atherosclerosis of coronary artery bypass graft(s), unspecified, with unspecified angina pectoris: Secondary | ICD-10-CM | POA: Diagnosis not present

## 2017-09-23 DIAGNOSIS — R0989 Other specified symptoms and signs involving the circulatory and respiratory systems: Secondary | ICD-10-CM

## 2017-09-23 DIAGNOSIS — J189 Pneumonia, unspecified organism: Secondary | ICD-10-CM | POA: Diagnosis not present

## 2017-09-23 DIAGNOSIS — Z978 Presence of other specified devices: Secondary | ICD-10-CM | POA: Diagnosis not present

## 2017-09-23 DIAGNOSIS — R1310 Dysphagia, unspecified: Secondary | ICD-10-CM | POA: Diagnosis not present

## 2017-09-23 DIAGNOSIS — Z430 Encounter for attention to tracheostomy: Secondary | ICD-10-CM | POA: Diagnosis not present

## 2017-09-23 DIAGNOSIS — E870 Hyperosmolality and hypernatremia: Secondary | ICD-10-CM | POA: Diagnosis present

## 2017-09-23 DIAGNOSIS — N183 Chronic kidney disease, stage 3 (moderate): Secondary | ICD-10-CM | POA: Diagnosis present

## 2017-09-23 DIAGNOSIS — J90 Pleural effusion, not elsewhere classified: Secondary | ICD-10-CM

## 2017-09-23 DIAGNOSIS — G931 Anoxic brain damage, not elsewhere classified: Secondary | ICD-10-CM | POA: Diagnosis not present

## 2017-09-23 DIAGNOSIS — E876 Hypokalemia: Secondary | ICD-10-CM | POA: Diagnosis not present

## 2017-09-23 DIAGNOSIS — R570 Cardiogenic shock: Secondary | ICD-10-CM | POA: Diagnosis not present

## 2017-09-23 DIAGNOSIS — I469 Cardiac arrest, cause unspecified: Secondary | ICD-10-CM | POA: Diagnosis not present

## 2017-09-23 DIAGNOSIS — Z4682 Encounter for fitting and adjustment of non-vascular catheter: Secondary | ICD-10-CM | POA: Diagnosis not present

## 2017-09-23 DIAGNOSIS — E872 Acidosis: Secondary | ICD-10-CM | POA: Diagnosis not present

## 2017-09-23 DIAGNOSIS — J9600 Acute respiratory failure, unspecified whether with hypoxia or hypercapnia: Secondary | ICD-10-CM | POA: Diagnosis not present

## 2017-09-23 DIAGNOSIS — J9610 Chronic respiratory failure, unspecified whether with hypoxia or hypercapnia: Secondary | ICD-10-CM | POA: Diagnosis not present

## 2017-09-23 DIAGNOSIS — I214 Non-ST elevation (NSTEMI) myocardial infarction: Secondary | ICD-10-CM | POA: Diagnosis not present

## 2017-09-23 DIAGNOSIS — E1165 Type 2 diabetes mellitus with hyperglycemia: Secondary | ICD-10-CM | POA: Diagnosis present

## 2017-09-23 DIAGNOSIS — A0472 Enterocolitis due to Clostridium difficile, not specified as recurrent: Secondary | ICD-10-CM | POA: Diagnosis not present

## 2017-09-23 DIAGNOSIS — E46 Unspecified protein-calorie malnutrition: Secondary | ICD-10-CM | POA: Diagnosis present

## 2017-09-23 DIAGNOSIS — I4901 Ventricular fibrillation: Secondary | ICD-10-CM | POA: Diagnosis not present

## 2017-09-23 DIAGNOSIS — Z930 Tracheostomy status: Secondary | ICD-10-CM | POA: Diagnosis not present

## 2017-09-23 DIAGNOSIS — I509 Heart failure, unspecified: Secondary | ICD-10-CM | POA: Diagnosis not present

## 2017-09-23 LAB — COMPREHENSIVE METABOLIC PANEL
ALT: 34 U/L (ref 17–63)
AST: 34 U/L (ref 15–41)
Albumin: 1.9 g/dL — ABNORMAL LOW (ref 3.5–5.0)
Alkaline Phosphatase: 432 U/L — ABNORMAL HIGH (ref 38–126)
Anion gap: 10 (ref 5–15)
BILIRUBIN TOTAL: 1.4 mg/dL — AB (ref 0.3–1.2)
BUN: 69 mg/dL — AB (ref 6–20)
CO2: 24 mmol/L (ref 22–32)
Calcium: 8.4 mg/dL — ABNORMAL LOW (ref 8.9–10.3)
Chloride: 113 mmol/L — ABNORMAL HIGH (ref 101–111)
Creatinine, Ser: 1.77 mg/dL — ABNORMAL HIGH (ref 0.61–1.24)
GFR calc Af Amer: 45 mL/min — ABNORMAL LOW (ref >60)
GFR, EST NON AFRICAN AMERICAN: 39 mL/min — AB (ref >60)
Glucose, Bld: 184 mg/dL — ABNORMAL HIGH (ref 65–99)
POTASSIUM: 3.4 mmol/L — AB (ref 3.5–5.1)
Sodium: 147 mmol/L — ABNORMAL HIGH (ref 135–145)
TOTAL PROTEIN: 6.1 g/dL — AB (ref 6.5–8.1)

## 2017-09-23 LAB — GLUCOSE, CAPILLARY
GLUCOSE-CAPILLARY: 183 mg/dL — AB (ref 65–99)
GLUCOSE-CAPILLARY: 167 mg/dL — AB (ref 65–99)
Glucose-Capillary: 163 mg/dL — ABNORMAL HIGH (ref 65–99)
Glucose-Capillary: 168 mg/dL — ABNORMAL HIGH (ref 65–99)
Glucose-Capillary: 166 mg/dL — ABNORMAL HIGH (ref 65–99)
Glucose-Capillary: 214 mg/dL — ABNORMAL HIGH (ref 65–99)

## 2017-09-23 LAB — CBC
HCT: 31.5 % — ABNORMAL LOW (ref 39.0–52.0)
Hemoglobin: 9.6 g/dL — ABNORMAL LOW (ref 13.0–17.0)
MCH: 26 pg (ref 26.0–34.0)
MCHC: 30.5 g/dL (ref 30.0–36.0)
MCV: 85.4 fL (ref 78.0–100.0)
Platelets: 250 K/uL (ref 150–400)
RBC: 3.69 MIL/uL — ABNORMAL LOW (ref 4.22–5.81)
RDW: 19.5 % — ABNORMAL HIGH (ref 11.5–15.5)
WBC: 8.7 K/uL (ref 4.0–10.5)

## 2017-09-23 LAB — MAGNESIUM: MAGNESIUM: 2.4 mg/dL (ref 1.7–2.4)

## 2017-09-23 LAB — PHOSPHORUS: Phosphorus: 3.6 mg/dL (ref 2.5–4.6)

## 2017-09-23 MED ORDER — VITAL HIGH PROTEIN PO LIQD: 1000.0000 mL | ORAL | 0 refills | Status: AC

## 2017-09-23 MED ORDER — QUETIAPINE FUMARATE 50 MG PO TABS: 50.0000 mg | ORAL_TABLET | Freq: Every day | ORAL | 0 refills | Status: AC

## 2017-09-23 MED ORDER — CHLORHEXIDINE GLUCONATE CLOTH 2 % EX PADS: 6.0000 | MEDICATED_PAD | Freq: Every day | CUTANEOUS | 0 refills | Status: AC

## 2017-09-23 MED ORDER — FREE WATER: 300.0000 mL | Freq: Four times a day (QID) | 0 refills | Status: AC

## 2017-09-23 MED ORDER — AMIODARONE HCL 200 MG PO TABS: 200.0000 mg | ORAL_TABLET | Freq: Every day | ORAL | 0 refills | Status: AC

## 2017-09-23 MED ORDER — ATORVASTATIN CALCIUM 40 MG PO TABS: 40.0000 mg | ORAL_TABLET | Freq: Every day | ORAL | 0 refills | Status: AC

## 2017-09-23 MED ORDER — GERHARDT'S BUTT CREAM: 1.0000 "application " | TOPICAL_CREAM | Freq: Four times a day (QID) | CUTANEOUS | 0 refills | Status: AC | PRN

## 2017-09-23 MED ORDER — POTASSIUM CHLORIDE 10 MEQ/100ML IV SOLN
10.0000 meq | INTRAVENOUS | Status: AC
  Administered 2017-09-23 (×4): 10 meq via INTRAVENOUS
  Filled 2017-09-23 (×4): qty 100

## 2017-09-23 MED ORDER — ETOMIDATE 2 MG/ML IV SOLN
INTRAVENOUS | Status: AC
  Administered 2017-09-23: 20 mg
  Filled 2017-09-23: qty 10

## 2017-09-23 NOTE — Procedures (Signed)
Bronchoscopy Procedure Note Mark Martin [id number] 1956/05/18  Procedure: Bronchoscopy Indications: Diagnostic evaluation of the airways, Obtain specimens for culture and/or other diagnostic studies and Remove secretions  Procedure Details Consent: Risks of procedure as well as the alternatives and risks of each were explained to the (patient/caregiver).  Consent for procedure obtained. Time Out: Verified patient identification, verified procedure, site/side was marked, verified correct patient position, special equipment/implants available, medications/allergies/relevent history reviewed, required imaging and test results available.  Performed  In preparation for procedure, patient was given 100% FiO2 and bronchoscope lubricated. Sedation: Benzodiazepines, Etomidate and Fentanyl  Airway entered and the following bronchi were examined: RUL, RML, RLL, LUL, LLL and Bronchi.   LUL endobronchial lesion noted, biopsy from there but only two biopsy and wash were performed since the combination of disposable scope and rigid biopsy forceps made it near impossible to obtain samples.  Patient is being moved to select and they can follow up on that upstairs.  Family notified. Bronchoscope removed.  , Patient placed back on 100% FiO2 at conclusion of procedure.    Evaluation Hemodynamic Status: BP stable throughout; O2 sats: stable throughout Patient's Current Condition: stable Specimens:  Sent serosanguinous fluid Complications: No apparent complications Patient did tolerate procedure well.   Mark Martin 09/26/2017

## 2017-09-23 NOTE — Progress Notes (Signed)
PULMONARY / CRITICAL CARE MEDICINE   Name: Mark Martin MRN: [id number] DOB: Oct 08, 1955    ADMISSION DATE:  09/05/2017   REFERRING MD: Emergency department physician  CHIEF COMPLAINT: Cardiac arrest  BRIEF  Mark Martin is a 62 year old male with known ischemic cardiomyopathy, ejection fraction less than 20%, with an AICD in place.  Was in his usual state of health until 09/05/2017 when he had witnessed arrest.  Family started CPR EMS arrived and ROSC within 25 minutes with 1 dose of epinephrine.  His AICD was interrogated and showed 25 minutes of VT in PEA.  His AICD did shock him x1. He is transferred to [facility] emergency department where he was intubated and started on epinephrine drip.  He continued to have multiple episodes of PEA requiring increasing doses of epinephrine. PCCM called for admission and hypothermia protocol.     STUDIES:  09/05/2017 2D echo >>>EF 15%, possible early forming thrombus, severe diffuse hypokinesis, grade 2 diastolic dysfunction, PA peak pressure 31mmHg 09/05/2017 CT of the head >>>neg acute  1/15 EEG>>> diffuse slowing; non specific etiology patter. No epileptiform discharge. 1/19: CT abd/pelvis 1. Marked distension of the urinary bladder measuring approximately 1370 cc. There is bilateral pelvocaliectasis and mild hydroureter. Correlate for any clinical signs or symptoms of bladder outlet obstruction. 2. Mild prostate gland enlargement. 3. Bilateral pleural effusions and overlying areas of atelectasis and airspace consolidation. Cannot rule out pneumonia. 4.  Aortic Atherosclerosis (ICD10-I70.0). 5. Gallstone identified within the neck of gallbladder. Renal US 1/20: Mild hydronephrosis on the left.  No hydronephrosis on the right.  CULTURES: Blood 1/12>>>NTD Sputum 1/12>>> normal flora  Urine culture 117 2019-negative 09/12/2017 blood cultures x2>> 09/12/2016 sputum culture>>neg   ANTIBIOTICS: Rocephin 1/12>>>off  09/13/2017  Zosyn>>1/24 09/13/2017 vancomycin>>1/21  LINES/TUBES: 09/05/2017 ETT>> 1/24 tstomy 09/05/2017 R fem CVL>>> out 09/05/2017 right femoral aline>>> out Right double-lumen PICC basilic vein 0/25/4270>> 09/14/2017 Foley catheter placed for bladder obstruction>>  SIGNIFICANT EVENTS: 09/05/2017 VT PEA arrest 09/08/17 - Pressor demand decreasing, no events overnight 09/09/17 - worsening AKI. Family at beside. On vent. Daughter at bedside and wife report less responsiveness x 48h/ Last prn sedation of fent/versed on 09/06/17 - per RN moves all 4s, non purposeful and weaning on SBT. Off pressors. Has femoral lines 1/16 - No acute change overnight.  Tolerating pressure support wean 5/5.  Looks good from respiratory standpoint.  More awake but not following commands. 1/18- Weaning this am on 30%, 5/5, pulling good volumes with prn fentanyl and versed. MAE x 4, but does not follow commands 09/14/2017 intermittently follow commands. 09/14/2017 was found to have bladder obstruction on CT of the abdomen.  Foley was placed. 1/27 -  agitated , moving all 4 Es 'not directible Bleeding around tstomy site   SUBJECTIVE/OVERNIGHT/INTERVAL HX 1/28 - no more trach bleeding. Not on sedation gtt. On vent at night. On ATC at day. Not on pressors. Has foley. RN describes frequent suctioning of tan scretions. Tmax 99.1 and within recent range of several days  VITAL SIGNS: BP 119/77   Pulse 82   Temp 99.1 F (37.3 C) (Oral)   Resp (!) 22   Ht 5\' 6"  (1.676 m)   Wt 171 lb 1.2 oz (77.6 kg)   SpO2 98%   BMI 27.61 kg/m   HEMODYNAMICS:    VENTILATOR SETTINGS: Vent Mode: PRVC FiO2 (%):  [40 %-50 %] 40 % Set Rate:  [14 bmp] 14 bmp Vt Set:  [500 mL] 500 mL PEEP:  [5 [place]  Pressure:  [7 cmH20-20 cmH20] 12 cmH20  INTAKE / OUTPUT:  Intake/Output Summary (Last 24 hours) at 09/16/2017 0817 Last data filed at 09/08/2017 0600 Gross per 24 hour  Intake 2520 ml  Output 1720 ml  Net 800 ml    PHYSICAL  EXAMINATION:   General Appearance:    Looks chronic criticall ill  Head:    Normocephalic, without obvious abnormality, atraumatic  Eyes:    PERRL - yes, conjunctiva/corneas - clear      Ears:    Normal external ear canals, both ears  Nose:   NG tube - panda  Throat:  ETT TUBE - trach + , OG tube - no  Neck:   Supple,  No enlargement/tenderness/nodules     Lungs:     Clear to auscultation bilaterall - yes on ATC  Chest wall:    No deformity  Heart:    S1 and S2 normal, no murmur, CVP - no.  Pressors - no  Abdomen:     Soft, no masses, no organomegaly  Genitalia:    Not done  Rectal:   not done  Extremities:   Extremities- intact     Skin:   Intact in exposed areas .      Neurologic:   Sedation - none -> RASS - +1 . Moves all 4s - yes. CAM-ICU - + for delirium. Answers yes/no to som simple questions . Orientation - not directable       PULMONARY Recent Labs  Lab 09/16/17 2228 09/19/17 0415  PHART 7.451* 7.430  PCO2ART 46.3 43.1  PO2ART 250.0* 95.1  HCO3 32.3* 28.1*  TCO2 34*  --   O2SAT 100.0 97.2    CBC Recent Labs  Lab 09/19/17 0402 09/21/17 0826 09/22/17 1124  HGB 11.0* 10.4* 10.6*  HCT 35.0* 33.7* 33.8*  WBC 11.8* 10.7* 10.2  PLT 205 262 266    COAGULATION Recent Labs  Lab 09/19/17 0402  INR 1.41    CARDIAC   No results for input(s): TROPONINI in the last 168 hours. No results for input(s): PROBNP in the last 168 hours.   CHEMISTRY Recent Labs  Lab 09/17/17 1834 09/17/17 2141 09/18/17 0403 09/18/17 1824 09/19/17 0402 09/21/17 0826 09/22/17 1124  NA  --  141 148*  --  145 147* 147*  K  --  3.2* 3.9  --  3.5 3.8 3.4*  CL  --  104 110  --  107 111 112*  CO2  --  25 26  --  27 25 24   GLUCOSE  --  370* 139*  --  140* 203* 255*  BUN  --  82* 78*  --  78* 75* 72*  CREATININE  --  2.58* 2.67*  --  2.26* 2.52* 2.11*  CALCIUM  --  8.0* 8.4*  --  8.3* 8.5* 8.3*  MG 2.2  --  2.2 2.2 2.2  --   --   PHOS 4.6  --  3.9 3.5 3.8  --   --     Estimated Creatinine Clearance: 35.1 mL/min (A) (by C-G formula based on SCr of 2.11 mg/dL (H)).  LIVER Recent Labs  Lab 09/17/17 0243 09/18/17 0403 09/19/17 0402 09/21/17 0826  AST 35 53*  --  33  ALT 59 63  --  39  ALKPHOS 271* 391*  --  435*  BILITOT 1.3* 1.1  --  1.5*  PROT 5.4* 5.7*  --  6.0*  ALBUMIN 1.8* 1.9*  --  1.9*  INR  --   --  1.41  --    INFECTIOUS No results for input(s): LATICACIDVEN, PROCALCITON in the last 168 hours. ENDOCRINE CBG (last 3)  Recent Labs    09/22/17 2306 09/17/2017 0322 09/26/2017 0744  GLUCAP 204* 168* 166*   IMAGING x48h  - image(s) personally visualized  -   highlighted in bold No results found. ASSESSMENT / PLAN:  s/p VT/VF arrest in setting of NSTEMI Ischemic cardiomyopathy - EF 15%, AICD in place  Cardiogenic shock (resolved) Atrial fib    - nil acute plan Continue aspirin but Hold Plavix and off Iv heparin Continue amiodarone Holding ARB/ACE inhibitor due to renal dysfunction Holding beta-blockade    Anoxic encephalopathy status post PEA arrest w/ ICU related physical  deconditioning  -has made some improvement over time very slowly  Plan Continue supportive care Continue routine delirium interventions seroquel 50 to contiue Versed prn  Dysphagia  Plan Continue tube feeds May require PEG   acute respiratory failure s/p cardiac arrest & now vent dependent d/t inability to protect airway Tracheostomy  1/24 - bleeding    09/02/2017 - no bleeding from trach  Plan Daily trach collar trialing; nocturnal vent for now  Pressure support as indicated Mobilize Will need SLP evaluation once secretions will allow for Passy-Muir valve trials  AKI: initial insult:  cardiac arrest but then complicated by diuresis and Urinary retention w/ MIld right hydronephrosis   on 1/19 w/ MIld Hydronephrosis identified via f/c on CT of the abdomen.  -f/c placed Urology called, nothing to add  Plan Continue doxazosin Lasix  intermittently  Mild  hypernatremia  Plan Continue  free water    Elevated LFTs -resolved  Fever w/ leukocytosis on 1/17 with elevated pro-calcitonin >source unclear UC neg, BC neg sputum neg. Sp 7d antibiotics  Plan Monitor off antibiotics  Hyperglycemia  Plan ssi and basal dosing   FAMILY  - Updates: No family bedside   - Inter-disciplinary family meet or Palliative Care meeting due by:  day 7  DVT prophylaxis: scd SUP: ppi Diet: tubefeed Activity: br Disposition : ICU   [employer] is now primary [facility] hospitalist. PCCM will see 2X/week for trach care. Needs LTAC  Dr. Brand Males, M.D., [facility].C.P Pulmonary and Critical Care Medicine Staff Physician, [place] Director - Interstitial Lung Disease  Program  Pulmonary [place] [facility] [place], [place], [address]  Pager: [phone], If no answer or between  15:00h - 7:00h: call [phone] Telephone: [phone]

## 2017-09-23 NOTE — Discharge Instructions (Signed)
Acute Respiratory Failure, Adult Acute respiratory failure occurs when there is not enough oxygen passing from your lungs to your body. When this happens, your lungs have trouble removing carbon dioxide from the blood. This causes your blood oxygen level to drop too low as carbon dioxide builds up. Acute respiratory failure is a medical emergency. It can develop quickly, but it is temporary if treated promptly. Your lung capacity, or how much air your lungs can hold, may improve with time, exercise, and treatment. What are the causes? There are many possible causes of acute respiratory failure, including:  Lung injury.  Chest injury or damage to the ribs or tissues near the lungs.  Lung conditions that affect the flow of air and blood into and out of the lungs, such as pneumonia, acute respiratory distress syndrome, and cystic fibrosis.  Medical conditions, such as strokes or spinal cord injuries, that affect the muscles and nerves that control breathing.  Blood infection (sepsis).  Inflammation of the pancreas (pancreatitis).  A blood clot in the lungs (pulmonary embolism).  A large-volume blood transfusion.  Burns.  Near-drowning.  Seizure.  Smoke inhalation.  Reaction to medicines.  Alcohol or drug overdose.  What increases the risk? This condition is more likely to develop in people who have:  A blocked airway.  Asthma.  A condition or disease that damages or weakens the muscles, nerves, bones, or tissues that are involved in breathing.  A serious infection.  A health problem that blocks the unconscious reflex that is involved in breathing, such as hypothyroidism or sleep apnea.  A lung injury or trauma.  What are the signs or symptoms? Trouble breathing is the main symptom of acute respiratory failure. Symptoms may also include:  Rapid breathing.  Restlessness or anxiety.  Skin, lips, or fingernails that appear blue (cyanosis).  Rapid heart  rate.  Abnormal heart rhythms (arrhythmias).  Confusion or changes in behavior.  Tiredness or loss of energy.  Feeling sleepy or having a loss of consciousness.  How is this diagnosed? Your health care provider can diagnose acute respiratory failure with a medical history and physical exam. During the exam, your health care provider will listen to your heart and check for crackling or wheezing sounds in your lungs. Your may also have tests to confirm the diagnosis and determine what is causing respiratory failure. These tests may include:  Measuring the amount of oxygen in your blood (pulse oximetry). The measurement comes from a small device that is placed on your finger, earlobe, or toe.  Other blood tests to measure blood gases and to look for signs of infection.  Sampling your cerebral spinal fluid or tracheal fluid to check for infections.  Chest X-ray to look for fluid in spaces that should be filled with air.  Electrocardiogram (ECG) to look at the heart's electrical activity.  How is this treated? Treatment for this condition usually takes places in a hospital intensive care unit (ICU). Treatment depends on what is causing the condition. It may include one or more treatments until your symptoms improve. Treatment may include:  Supplemental oxygen. Extra oxygen is given through a tube in the nose, a face mask, or a hood.  A device such as a continuous positive airway pressure (CPAP) or bi-level positive airway pressure (BiPAP or BPAP) machine. This treatment uses mild air pressure to keep the airways open. A mask or other device will be placed over your nose or mouth. A tube that is connected to a motor will deliver  oxygen through the mask.  Ventilator. This treatment helps move air into and out of the lungs. This may be done with a bag and mask or a machine. For this treatment, a tube is placed in your windpipe (trachea) so air and oxygen can flow to the lungs.  Extracorporeal  membrane oxygenation (ECMO). This treatment temporarily takes over the function of the heart and lungs, supplying oxygen and removing carbon dioxide. ECMO gives the lungs a chance to recover. It may be used if a ventilator is not effective.  Tracheostomy. This is a procedure that creates a hole in the neck to insert a breathing tube.  Receiving fluids and medicines.  Rocking the bed to help breathing.  Follow these instructions at home:  Take over-the-counter and prescription medicines only as told by your health care provider.  Return to normal activities as told by your health care provider. Ask your health care provider what activities are safe for you.  Keep all follow-up visits as told by your health care provider. This is important. How is this prevented? Treating infections and medical conditions that may lead to acute respiratory failure can help prevent the condition from developing. Contact a health care provider if:  You have a fever.  Your symptoms do not improve or they get worse. Get help right away if:  You are having trouble breathing.  You lose consciousness.  Your have cyanosis or turn blue.  You develop a rapid heart rate.  You are confused. These symptoms may represent a serious problem that is an emergency. Do not wait to see if the symptoms will go away. Get medical help right away. Call your local emergency services (911 in the [place] Do not drive yourself to the hospital. This information is not intended to replace advice given to you by your health care provider. Make sure you discuss any questions you have with your health care provider. Document Released: 08/18/2013 Document Revised: 03/10/2016 Document Reviewed: 02/29/2016 Elsevier Interactive Patient Education  2018 [employer].  Therapeutic Cooling After Cardiac Arrest Introduction Cardiac arrest is when the heart stops beating. Therapeutic cooling is a treatment for people whose heartbeat was  restarted after cardiac arrest but who are unresponsive. During therapeutic cooling, body temperature is lowered and kept low for a period of time. This helps to protect the brain and other organs by:  Slowing the release of substances that are released after the heart stops beating.  Decreasing the bodys need for oxygen.  Therapeutic cooling increases the person's chances for survival. HOW IS THERAPEUTIC COOLING DONE? During therapeutic cooling:  The person will be cooled for 24 hours and then slowly warmed over the next 24 hours. The person may be cooled with: ? Cold fluids given through an IV tube. ? Ice packs placed on the body. ? Wraps connected to a cooling machine that are applied to the chest and legs.  A device called a ventilator will be used to help the person breathe.  Medicines will be given to make the person more comfortable and prevent shivering.  IV tubes and other tubes will be used to help monitor the person.  Health care providers will work to find the cause of the cardiac arrest and treat it.  WHAT WILL RECOVERY BE LIKE? Some people wake up very quickly after the treatment, while others may take longer to wake up. The recovery process varies from person to person. Some people do not recover. The medical team will explain tests and results that  help show how the person is recovering. This information is not intended to replace advice given to you by your health care provider. Make sure you discuss any questions you have with your health care provider. Document Released: 08/18/2013 Document Revised: 01/19/2016 Document Reviewed: 07/15/2013 Elsevier Interactive Patient Education  [employer].

## 2017-09-23 NOTE — Discharge Summary (Signed)
Discharge Summary  Mark Martin:[id number] DOB: 07-Aug-1956  PCP: Garwin Brothers, MD  Admit date: 09/05/2017 Discharge date: 08/31/2017  Time spent: 25 minutes   Recommendations for Outpatient Follow-up:  1. Continue care at [facility] 2. Follow up with cardiology post hospitalization\ 3. Follow up with pulmonology after hospitalization 4. Take your medications as prescribed  5. Fall precaution  Discharge Diagnoses:  Active Hospital Problems   Diagnosis Date Noted  . Palliative care encounter   . Goals of care, counseling/discussion   . Dysphagia   . Respiratory failure ([place])   . Non-STEMI (non-ST elevated myocardial infarction) ([place]) 09/10/2017  . Ischemic cardiomyopathy 09/10/2017  . ICD (implantable cardioverter-defibrillator) discharge 09/10/2017  . Coronary artery disease involving coronary bypass graft of native heart with angina pectoris ([place]) 09/10/2017  . Acute on chronic combined systolic and diastolic heart failure, NYHA class 4 ([place]) 09/10/2017  . Acute renal failure with acute cortical necrosis ([place]) 09/10/2017  . Cardiogenic shock ([place]) --resolved 09/10/2017  . Anoxic brain injury ([place]) 09/10/2017  . Cardiac arrest ([place]) 09/05/2017  . 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    Discharge Condition: Stable  Diet recommendation: Tube feeding  Vitals:   09/15/2017 1109 09/10/2017 1200  BP: 106/70 123/79  Pulse: 86 91  Resp: (!) 28 (!) 31  Temp: 98.6 F (37 C)   SpO2: 96% 98%    History of present illness:  [name] is a 62 year old male with ischemic CM EF 15%, with an AICD in place. Was in his usual state of health until 09/05/2017 when he had witnessed arrest. His AICD did shock him x1.Brought to [facility] ED where he was intubated and started on epinephrine drip. He continued to have multiple episodes of PEA requiring increasing doses of epinephrine. Admitted to ICU to continue his care. He is  now vent dependent due to inability to protect his airway. Had tracheostomy 09/19/17.  09/21/17: Medicine asked to continue care of the patient. Bleeding trach collar. 09/22/17: Does not open eyes does not follow commands. Called the patient's wife at this number [phone]. No answers and no calls back. Palliative care team consulted. 09/02/2017: Patient is more alert and is tracking with his eyes. Does not follow commands and is non verbal. No more trach bleeding. Not on sedation drip. Frequent suctioning of tan secretions.  On the day of discharge the patient was hemodynamically stable. He will continue care at [facility] facility. Still requires vent at night. Patient will need to be followed by cardiology and pulmonology post hospitalization.  STUDIES:  09/05/2017 2D echo >>>EF 15%, possible early forming thrombus, severe diffuse hypokinesis, grade 2 diastolic dysfunction, PA peak pressure 1mmHg 09/05/2017 CT of the head >>>neg acute  1/15 EEG>>> diffuse slowing; non specific etiology patter. No epileptiform discharge. 1/19: CT abd/pelvis 1. Marked distension of the urinary bladder measuring approximately 1370 cc. There is bilateral pelvocaliectasis and mild hydroureter. Correlate for any clinical signs or symptoms of bladder outlet obstruction. 2. Mild prostate gland enlargement. 3. Bilateral pleural effusions and overlying areas of atelectasis and airspace consolidation. Cannot rule out pneumonia. 4. Aortic Atherosclerosis (ICD10-I70.0). 5. Gallstone identified within the neck of gallbladder. Renal US 1/20: Mild hydronephrosis on the left. No hydronephrosis on the right.  CULTURES: Blood 1/12>>>NTD Sputum 1/12>>> normal flora  Urine culture 117 2019-negative 09/12/2017 blood cultures x2>> 09/12/2016 sputum culture>>neg   ANTIBIOTICS: Rocephin 1/12>>>off  09/13/2017 Zosyn>>1/24 09/13/2017 vancomycin>>1/21  LINES/TUBES: 09/05/2017 ETT>> 1/24  tstomy 09/05/2017 R fem CVL>>> out 09/05/2017  right femoral aline>>> out Right double-lumen PICC basilic vein 09/28/5425>> 09/14/2017 Foley catheter placed for bladder obstruction>>  SIGNIFICANT EVENTS: 09/05/2017 VT PEA arrest 09/08/17 - Pressor demand decreasing, no events overnight 09/09/17 - worsening AKI. Family at beside. On vent. Daughter at bedside and wife report less responsiveness x 48h/ Last prn sedation of fent/versed on 09/06/17 - per RN moves all 4s, non purposeful and weaning on SBT. Off pressors. Has femoral lines 1/16 - No acute change overnight.  Tolerating pressure support wean 5/5.  Looks good from respiratory standpoint.  More awake but not following commands. 1/18- Weaning this am on 30%, 5/5, pulling good volumes with prn fentanyl and versed. MAE x 4, but does not follow commands 09/14/2017 intermittently follow commands. 09/14/2017 was found to have bladder obstruction on CT of the abdomen.  Foley was placed. 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 Dr Roxy Manns   Cardiac arrest [facility])   Non-STEMI (non-ST elevated myocardial infarction) ([place])   Ischemic cardiomyopathy   ICD (implantable cardioverter-defibrillator) discharge   Coronary artery disease involving coronary bypass graft of native heart with angina pectoris ([place])   Acute on chronic combined systolic and diastolic heart failure, NYHA class 4 ([place])   Acute renal failure with acute cortical necrosis ([place])   Cardiogenic shock ([place]) --resolved   Anoxic brain injury ([place])   Respiratory failure ([place])   Palliative care encounter   Goals of care, counseling/discussion   Dysphagia  Cardiac arrest in the setting of NSTEMI -post hypothermia protocol on admission -cardiology followed, CCM followed -Close monitoring on telemetry  Ischemic CM - EF 15% (09/06/17) -AICD in place  -continue asa, lipitor, amiodarone -close monitoring -cardiology followed -Full  code-writer called wife to discuss goals of care; no calls back. -strict I&O's, daily weights  Anoxic encephalopathy status post PEA arrest complicated by ICU related physical deconditioning  -persistent -continue neurochecks  Dysphagia  -Continue tube feeds -will contact family regarding peg tube placement  Acute respiratory failure s/p cardiac arrest post tracheostomy  -vent dependent -continue vent at night -CCM managing vent -continuous pulse ox monitoring -tracheostomy 09/19/17 -resolved bleeding from trach collar  AKI on CKD 3, improving -cr 1.77 from 2.11 from 2.26 baseline 1.3 -avoid nephrotoxic agents/hypotension -gets free water flushes through his tube feeding  Urinary retention -mild hydronephrosis on Ct abd -foley in place -monitor urine output -doxazosin, lasix  Hyperglycemia  -a1c 8.9 (06/05/13) -ssi and basal dosing   Mild hypernatremia -na+ 147 from 147 -continue free water flushes -repeat BMP  Hypokalemia -K+ 3.4 -repleted  -magnesium 2.4 -Repeat BMP   Consultants -Cardiology -CCM   Discharge Exam: BP 123/79 (BP Location: Left Arm)   Pulse 91   Temp 98.6 F (37 C) (Oral)   Resp (!) 31   Ht 5\' 6"  (1.676 m)   Wt 77.6 kg (171 lb 1.2 oz)   SpO2 98%   BMI 27.61 kg/m   General: 53 year olf male WD WN NAD Alert, opens his eyes and tracks with his eyes but does not follow commands.  Cardiovascular: RRR no rubs or gallops  Respiratory: Mild rales anteriorly and bilaterally  Discharge Instructions You were cared for by a hospitalist during your hospital stay. If you have any questions about your discharge medications or the care you received while you were in the hospital after you  are discharged, you can call the unit and asked to speak with the hospitalist on call if the hospitalist that took care of you is not available. Once you are discharged, your primary care physician will handle any further medical issues. Please note that  NO REFILLS for any discharge medications will be authorized once you are discharged, as it is imperative that you return to your primary care physician (or establish a relationship with a primary care physician if you do not have one) for your aftercare needs so that they can reassess your need for medications and monitor your lab values.   Allergies as of 08/28/2017   No Known Allergies     Medication List    STOP taking these medications   acetaminophen 325 MG tablet Commonly known as:  TYLENOL   ibuprofen 200 MG tablet Commonly known as:  ADVIL,MOTRIN   metFORMIN 500 MG tablet Commonly known as:  GLUCOPHAGE   methocarbamol 750 MG tablet Commonly known as:  ROBAXIN     TAKE these medications   albuterol 108 (90 Base) MCG/ACT inhaler Commonly known as:  PROVENTIL HFA;VENTOLIN HFA Inhale 2 puffs into the lungs every 6 (six) hours as needed for wheezing or shortness of breath.   amiodarone 200 MG tablet Commonly known as:  PACERONE Place 1 tablet (200 mg total) into feeding tube daily. Start taking on:  09/24/2017   aspirin EC 81 MG tablet Take 81 mg by mouth daily.   atorvastatin 40 MG tablet Commonly known as:  LIPITOR Place 1 tablet (40 mg total) into feeding tube daily at 6 PM.   Chlorhexidine Gluconate Cloth 2 % Pads Apply 6 each topically daily. Start taking on:  09/24/2017   feeding supplement (VITAL HIGH PROTEIN) Liqd liquid Place 1,000 mLs into feeding tube daily. Start taking on:  09/24/2017   free water Soln Place 300 mLs into feeding tube every 6 (six) hours.   furosemide 40 MG tablet Commonly known as:  LASIX Take 40 mg by mouth 2 (two) times daily.   Gerhardt's butt cream Crea Apply 1 application topically 4 (four) times daily as needed for irritation (in peri area).   isosorbide mononitrate 30 MG 24 hr tablet Commonly known as:  IMDUR Take 30 mg by mouth every morning.   nitroGLYCERIN 0.4 MG SL tablet Commonly known as:  NITROSTAT Place 0.4 mg  under the tongue as needed.   QUEtiapine 50 MG tablet Commonly known as:  SEROQUEL Take 1 tablet (50 mg total) by mouth daily. Start taking on:  09/24/2017   spironolactone 25 MG tablet Commonly known as:  ALDACTONE Take 25 mg by mouth every other day.   tamsulosin 0.4 MG Caps capsule Commonly known as:  FLOMAX Take 0.4 mg by mouth daily.   ticagrelor 90 MG Tabs tablet Commonly known as:  BRILINTA Take 90 mg by mouth 2 (two) times daily.   TOUJEO SOLOSTAR 300 UNIT/ML Sopn Generic drug:  Insulin Glargine Inject 50 Units into the skin daily.      No Known Allergies Follow-up Information    Garwin Brothers, MD Follow up.   Specialty:  Internal Medicine Contact information: [address] [place] [address] [phone]        Brand Males, MD Follow up.   Specialty:  Pulmonary Disease Contact information: [place] [address] [phone]        Belva Crome, MD Follow up.   Specialty:  Cardiology Contact information: [address] [place] [address] [phone]            The results of significant diagnostics from this hospitalization (including imaging, microbiology, ancillary and laboratory) are listed below for reference.    Significant Diagnostic Studies: Ct Abdomen Pelvis Wo Contrast  Result Date: 09/15/2017 CLINICAL DATA:  Tenderness and mid quadrant of the left hemiabdomen. EXAM: CT ABDOMEN AND PELVIS WITHOUT CONTRAST TECHNIQUE: Multidetector CT imaging of the abdomen and pelvis was performed following the standard protocol without IV contrast. COMPARISON:  Ultrasound of the abdomen 09/13/2017 and CT of the abdomen and pelvis from 07/23/2015 FINDINGS: Lower chest: Small to moderate bilateral pleural effusions identified. Atelectasis is identified in both lung bases. Atelectasis and airspace consolidation noted in both lung bases. Aortic atherosclerosis noted. Hepatobiliary: No focal liver abnormality.  Stone within the neck of gallbladder identified measuring 1 cm, image 35 of series 4. Within the limitations of unenhanced technique no biliary dilatation noted. Pancreas: Unremarkable. No pancreatic ductal dilatation or surrounding inflammatory changes. Spleen: Normal in size without focal abnormality. Adrenals/Urinary Tract: The adrenal glands appear normal. Tiny stone within the upper pole of right kidney measures 2 mm. No left renal calculi. Bilateral pelvocaliectasis and hydroureter identified. No obstructing stone identified. There is moderate distension of the urinary bladder which measures 12.2 by 12.1 by 17.7 cm (volume = 1370 cm^3). Stomach/Bowel: Stomach appears normal. Nasogastric tube tip is in the proximal stomach. No pathologic dilatation of the small bowel loops. The appendix is not visualized separate from the right lower quadrant bowel loops. Proximal colon is unremarkable. Distal colonic diverticula identified. No acute inflammation. No pathologic dilatation of the colon. Vascular/Lymphatic: Aortic atherosclerosis. No aneurysm. No upper abdominal or pelvic adenopathy. No inguinal adenopathy. Reproductive: The prostate gland measures 3.9 x 4.9 x 4.5 cm (volume = 45 cm^3). Other: No free fluid or fluid collections. Musculoskeletal: No acute or significant osseous findings. IMPRESSION: 1. Marked distension of the urinary bladder measuring approximately 1370 cc. There is bilateral pelvocaliectasis and mild hydroureter. Correlate for any clinical signs or symptoms of bladder outlet obstruction. 2. Mild prostate gland enlargement. 3. Bilateral pleural effusions and overlying areas of atelectasis and airspace consolidation. Cannot rule out pneumonia. 4.  Aortic Atherosclerosis (ICD10-I70.0). 5. Gallstone identified within the neck of gallbladder. Electronically Signed   By: Kerby Moors M.D.   On: 09/15/2017 00:16   Ct Head Wo Contrast  Result Date: 09/09/2017 CLINICAL DATA:  Altered level of  consciousness. EXAM: CT HEAD WITHOUT CONTRAST TECHNIQUE: Contiguous axial images were obtained from the base of the skull through the vertex without intravenous contrast. COMPARISON:  09/07/2017; 07/06/2018 FINDINGS: Brain: Scattered periventricular hypodensities compatible microvascular ischemic disease. Old lacunar infarct within the left basal ganglia (image 21, series 5). Gray-white differentiation otherwise well maintained without CT evidence superimposed acute large territory infarct. No intraparenchymal extra-axial mass or hemorrhage. Unchanged size and configuration of the ventricles and the basilar cisterns. No midline shift. Vascular: There is diffuse increased attenuation intracranial blood pool suggestive of hypokalemia. Skull: No displaced calvarial fracture. Sinuses/Orbits: Limited visualization of the paranasal sinuses and mastoid air cells is normal. No air-fluid levels. Post bilateral cataract surgery. Other: Regional soft tissues appear normal.  Patient is intubated. IMPRESSION: Similar findings of microvascular ischemic disease without superimposed acute intracranial process. Electronically Signed   By: Sandi Mariscal M.D.   On: 09/09/2017 12:20   Ct Head Wo Contrast  Result Date: 09/07/2017 CLINICAL DATA:  Hypoxic ischemic encephalopathy. EXAM: CT HEAD WITHOUT CONTRAST TECHNIQUE: Contiguous axial  images were obtained from the base of the skull through the vertex without intravenous contrast. COMPARISON:  CT HEAD September 05, 2017 FINDINGS: Moderately motion degraded examination. BRAIN: No intraparenchymal hemorrhage, mass effect nor midline shift. The ventricles and sulci are normal for age. Patchy supratentorial white matter hypodensities within normal range for patient's age, though non-specific are most compatible with chronic small vessel ischemic disease. No acute large vascular territory infarcts. No abnormal extra-axial fluid collections. Basal cisterns are patent. VASCULAR: Mild to  moderate calcific atherosclerosis of the carotid siphons. SKULL: No skull fracture. No significant scalp soft tissue swelling. SINUSES/ORBITS: Mild LEFT sphenoid sinusitis without air-fluid levels. Mastoid air cells are well aerated. Included ocular globes and orbital contents are non-suspicious. Status post bilateral ocular lens implants. OTHER: Life-support lines in place. IMPRESSION: 1. No acute intracranial process on this moderately motion degraded examination. 2. Mild to moderate chronic small vessel ischemic disease. Electronically Signed   By: Elon Alas M.D.   On: 09/07/2017 06:23   Ct Head Wo Contrast  Result Date: 09/05/2017 CLINICAL DATA:  Headache, posttraumatic EXAM: CT HEAD WITHOUT CONTRAST TECHNIQUE: Contiguous axial images were obtained from the base of the skull through the vertex without intravenous contrast. COMPARISON:  None. FINDINGS: Brain: Mild generalized age related parenchymal atrophy with commensurate dilatation of the ventricles and sulci. Mild chronic small vessel ischemic changes within the bilateral periventricular and subcortical white matter regions. No mass, hemorrhage, edema or other evidence of acute parenchymal abnormality. No extra-axial hemorrhage. Vascular: There are chronic calcified atherosclerotic changes of the large vessels at the skull base. No unexpected hyperdense vessel. Skull: Normal. Negative for fracture or focal lesion. Sinuses/Orbits: No acute finding. Other: None. IMPRESSION: 1. No acute findings. No intracranial mass, hemorrhage or edema. No skull fracture. 2. Mild atrophy and chronic small vessel ischemic changes in the white matter. Electronically Signed   By: Franki Cabot M.D.   On: 09/05/2017 16:24   US Abdomen Complete  Result Date: 09/13/2017 CLINICAL DATA:  Calcified gallstone by prior imaging. EXAM: ABDOMEN ULTRASOUND COMPLETE COMPARISON:  None. FINDINGS: Gallbladder: Shadowing calculus present near the neck of the gallbladder measuring  roughly 15 mm in greatest diameter. No associated gallbladder inflammation or distention. Common bile duct: Diameter: 3.5 mm Liver: No focal lesion identified. Within normal limits in parenchymal echogenicity. Portal vein is patent on color Doppler imaging with normal direction of blood flow towards the liver. IVC: No abnormality visualized. Pancreas: Visualized portion unremarkable. Spleen: Size and appearance within normal limits. Right Kidney: Length: 11 cm. The collecting system is mildly dilated. Left Kidney: Length: 11.5 cm. There is mild to moderate left hydronephrosis. Abdominal aorta: No aneurysm visualized. Other findings: No ascites visualized. IMPRESSION: 1. Single shadowing gallstone near the neck of the gallbladder measuring roughly 15 mm in greatest diameter. No associated gallbladder distention or inflammation. No evidence of biliary obstruction. 2. Evidence of bilateral hydronephrosis, left greater than right. Electronically Signed   By: Aletta Edouard M.D.   On: 09/13/2017 16:14   US Renal  Result Date: 09/15/2017 CLINICAL DATA:  Follow-up hydronephrosis. EXAM: RENAL / URINARY TRACT ULTRASOUND COMPLETE COMPARISON:  CT scan September 14, 2017 FINDINGS: Right Kidney: Length: 10.9 cm.  No hydronephrosis. Left Kidney: Length: 11.4 cm.  Mild hydronephrosis. Bladder: Decompressed with a catheter. IMPRESSION: Mild hydronephrosis on the left.  No hydronephrosis on the right. Electronically Signed   By: Dorise Bullion III M.D   On: 09/15/2017 20:32   Dg Chest Port 1 View  Result Date: 09/14/2017  CLINICAL DATA:  Abnormal breath sounds, decreased oxygen saturation intermittently, increased suction requirements. History of ischemic cardiomyopathy, NSTEMI, previous CABG. EXAM: PORTABLE CHEST 1 VIEW COMPARISON:  Chest x-ray of September 19, 2017 FINDINGS: The lungs are adequately inflated. There is interstitial and alveolar opacity bilaterally compatible with edema. The left retrocardiac region remains  dense. The cardiac silhouette is enlarged. The pulmonary vascularity is engorged and indistinct. The tracheostomy tube tip projects at the inferior margin of the clavicular heads. The feeding tube tip projects below the inferior margin of the image. The right-sided PICC line tip projects over the distal third of the SVC. The ICD is in stable position. IMPRESSION: CHF with pulmonary interstitial and alveolar edema. Probable left lower lobe atelectasis. The support tubes and devices are in reasonable position. Electronically Signed   By: David  Martinique M.D.   On: 08/27/2017 10:47   Dg Chest Port 1 View  Result Date: 09/19/2017 CLINICAL DATA:  Intubation EXAM: PORTABLE CHEST 1 VIEW COMPARISON:  Three days ago FINDINGS: Endotracheal tube tip just below the clavicular heads. An orogastric tube and side-port reaches the stomach. Biventricular ICD/pacer from the left. Right upper extremity PICC with tip at the upper cavoatrial junction. Lower volumes and increased diffuse lung opacity, likely combination of edema and effusions. Cardiopericardial enlargement. Status post CABG. IMPRESSION: 1. Progressive lung opacity, favor CHF. 2. Unremarkable positioning of tubes and central line. Electronically Signed   By: Monte Fantasia M.D.   On: 09/19/2017 08:06   Dg Chest Port 1 View  Result Date: 09/16/2017 CLINICAL DATA:  62 year old male status post re-intubation EXAM: PORTABLE CHEST 1 VIEW COMPARISON:  Prior chest x-ray obtained earlier today FINDINGS: The patient has been intubated. The tip of the endotracheal tube is 4 cm above the carina. A gastric tube is been placed. The tip of the tube overlies the GE junction. Stable cardiomegaly. Patient is status post median sternotomy with evidence of multivessel CABG including LIMA bypass. Left subclavian approach biventricular cardiac rhythm maintenance device. The leads remain in unchanged position. Right upper extremity PIC Catheter tip overlies the cavoatrial junction.  Pulmonary vascular congestion bordering on mild interstitial edema. Persistent left basilar patchy airspace opacity. IMPRESSION: 1. The tip of the endotracheal tube is 4 cm above the carina. 2. The tip of the gastric tube overlies the GE junction. Recommend advancing for more optimal placement in the stomach. 3. Persistent left basilar airspace opacity likely representing a combination of pleural fluid and atelectasis. 4. Cardiomegaly and pulmonary vascular congestion bordering on mild edema. Electronically Signed   By: Jacqulynn Cadet M.D.   On: 09/16/2017 16:11   Dg Chest Port 1 View  Result Date: 09/16/2017 CLINICAL DATA:  62 year old male with history of respiratory distress. EXAM: PORTABLE CHEST 1 VIEW COMPARISON:  Chest x-ray 09/15/2017. FINDINGS: Previously noted endotracheal tube and nasogastric tube have both been removed. There is a right upper extremity PICC with tip terminating in the superior cavoatrial junction. Left-sided biventricular pacemaker/AICD in place with lead tips projecting over the expected location of the right atrium, right ventricular apex and lateral wall of the left ventricle via the coronary sinus and coronary veins. Lung volumes are low. Patchy multifocal interstitial and airspace disease. Small left pleural effusion. Cephalization of the pulmonary vasculature. Mild cardiomegaly. Upper mediastinal contours are distorted by patient's rotation to the right. Aortic atherosclerosis. IMPRESSION: 1. Support apparatus and postoperative changes, as above. 2. The appearance of the chest is most compatible with mild to moderate congestive heart failure. The possibility of  superimposed pneumonia is not excluded, but not strongly favored. Electronically Signed   By: Vinnie Langton M.D.   On: 09/16/2017 07:39   Dg Chest Port 1 View  Result Date: 09/15/2017 CLINICAL DATA:  Hypoxia EXAM: PORTABLE CHEST 1 VIEW COMPARISON:  September 14, 2017 FINDINGS: Endotracheal tube tip is 5.8 cm above  the carina. Nasogastric tube tip and side port are below the diaphragm. Central catheter tip is at the cavoatrial junction. Pacemaker leads are attached to the right atrium, right ventricle, and coronary sinus. No pneumothorax. There is atelectatic change in the left base with small left pleural effusion. There is ill-defined airspace opacity in the mid right lung region. Heart is mildly enlarged with pulmonary vascularity within normal limits. Patient is status post coronary artery bypass grafting. No adenopathy. No bone lesions. IMPRESSION: Tube and catheter positions as described without pneumothorax. Airspace consolidation right mid lung concerning for pneumonia. Left base atelectasis with small left effusion, stable. Stable cardiac prominence. Electronically Signed   By: Lowella Grip III M.D.   On: 09/15/2017 07:42   Dg Chest Port 1 View  Result Date: 09/14/2017 CLINICAL DATA:  62 year old male status post cardiac arrest and resuscitation. Respiratory failure. EXAM: PORTABLE CHEST 1 VIEW COMPARISON:  09/13/2017 and earlier. FINDINGS: Portable AP semi upright view at 0413 hours. Stable endotracheal tube tip at the level the clavicles. Enteric tube courses to the abdomen, tip not included. Right PICC line now in place, tip at the right atrium level, about a centimeter or 2 between the expected cavoatrial junction level. Left chest cardiac AICD. Stable cardiomegaly and mediastinal contours. Prior CABG. Continued confluent left lung base opacity partially obscuring the left hemidiaphragm. Vague but confluent right perihilar opacity has increased since 09/12/2017 but not significantly changed since yesterday. No pneumothorax. Stable mild pulmonary vascular congestion. Possible small left pleural effusion. IMPRESSION: 1. Right PICC line placed, tip projects 1-2 centimeters below the expected level of the cavoatrial junction. 2.  Otherwise stable lines and tubes. 3. Stable multifocal bilateral pulmonary  opacity since yesterday suspicious for bilateral pneumonia. Possible small left pleural effusion. 4. Stable pulmonary vascular congestion without overt edema. Electronically Signed   By: Genevie Ann M.D.   On: 09/14/2017 06:55   Dg Chest Port 1 View  Result Date: 09/13/2017 CLINICAL DATA:  Hypoxia EXAM: PORTABLE CHEST 1 VIEW COMPARISON:  September 12, 2017 FINDINGS: Endotracheal tube tip is 5.5 cm above the carina. Nasogastric tube tip and side port are below the diaphragm. Pacemaker leads are attached to the right atrium, right ventricle, and coronary sinus. There is airspace consolidation in the right upper lobe and left base regions, essentially stable. There is cardiomegaly. The pulmonary vascularity is normal. Patient is status post coronary artery bypass grafting. No adenopathy. No evident bone lesions. IMPRESSION: Tube positions as described without pneumothorax. Areas of airspace consolidation in the right upper lobe and left base, likely multifocal pneumonia. No new opacity evident. Stable cardiac prominence. No change in cardiac silhouette. Electronically Signed   By: Lowella Grip III M.D.   On: 09/13/2017 07:31   Dg Chest Portable 1 View  Result Date: 09/12/2017 CLINICAL DATA:  Followup ventilator support. EXAM: PORTABLE CHEST 1 VIEW COMPARISON:  09/09/2017 FINDINGS: Endotracheal tube tip is 5 cm above the carina. Nasogastric tube enters the abdomen. Pacemaker/AICD appears the same. There is slight worsening of patchy lung density in the mid lungs and lower lobes which could be a combination of atelectasis, edema and pneumonia. No pronounced change. There is a  small amount of pleural fluid on the left. IMPRESSION: Slight worsening of patchy bilateral lung density, most pronounced in the left lower lobe. Findings could be secondary to atelectasis, edema and/or pneumonia. Small amount of pleural fluid on the left. Electronically Signed   By: Nelson Chimes M.D.   On: 09/12/2017 09:26   Dg Chest Port  1 View  Result Date: 09/09/2017 CLINICAL DATA:  Hypoxia EXAM: PORTABLE CHEST 1 VIEW COMPARISON:  September 08, 2017 FINDINGS: Endotracheal tube tip is 5.7 cm above the carina. Nasogastric tube tip and side port are below the diaphragm. Pacemaker lead tips are attached the right atrium, right ventricle, and coronary sinus. No pneumothorax. There is atelectatic change in the left base with small left pleural effusion. Right lung is clear. Heart is mildly enlarged with pulmonary vascularity within normal limits. No adenopathy. No evident bone lesions. IMPRESSION: Tube positions as described without pneumothorax. Stable cardiac prominence. Left base atelectasis with small left pleural effusion. Right lung clear. Electronically Signed   By: Lowella Grip III M.D.   On: 09/09/2017 07:37   Dg Chest Port 1 View  Result Date: 09/08/2017 CLINICAL DATA:  History of ET tube EXAM: PORTABLE CHEST 1 VIEW COMPARISON:  09/07/2017 FINDINGS: Endotracheal tube and NG tube as well as left AICD remain in place, unchanged. Cardiomegaly. Prior CABG. Vascular congestion with interstitial prominence slightly increased since prior study, likely mild interstitial edema. Left base atelectasis or infiltrate again noted, unchanged. IMPRESSION: Slight increasing interstitial prominence, possibly interstitial edema. Continued left lower lobe atelectasis or infiltrate. Electronically Signed   By: Rolm Baptise M.D.   On: 09/08/2017 07:15   Dg Chest Port 1 View  Result Date: 09/07/2017 CLINICAL DATA:  Respiratory failure. EXAM: PORTABLE CHEST 1 VIEW COMPARISON:  One-view chest x-ray 09/06/2017 FINDINGS: Endotracheal tube and NG tube are stable. Heart is enlarged. Pacing and defibrillator wires are stable. A left pleural effusion and lower lobe airspace disease remains. Mild pulmonary vascular congestion is stable. The right lung is clear. IMPRESSION: 1. Stable left pleural effusion and basilar airspace disease, likely atelectasis. 2. Mild  vascular congestion is stable. 3. Support apparatus is stable. Electronically Signed   By: San Morelle M.D.   On: 09/07/2017 07:15   Dg Chest Port 1 View  Result Date: 09/06/2017 CLINICAL DATA:  Respiratory difficulty EXAM: PORTABLE CHEST 1 VIEW COMPARISON:  09/05/2017 FINDINGS: Endotracheal and NG tubes stable. Left subclavian AICD device and leads are stable. Mediastinal tube is been removed. Vascular congestion without Kerley B lines are stable. Opacity at the left base is stable. No pneumothorax. IMPRESSION: Mediastinal tube removal without pneumothorax. Stable vascular congestion. Electronically Signed   By: Marybelle Killings M.D.   On: 09/06/2017 09:17   Dg Chest Port 1 View  Result Date: 09/05/2017 CLINICAL DATA:  Post CPR EXAM: PORTABLE CHEST 1 VIEW COMPARISON:  04/23/2017 FINDINGS: Endotracheal tube is 6 cm above the carina. NG tube enters the stomach. Left AICD remains in place, unchanged. Prior CABG. Bilateral perihilar opacities, likely mild edema. Cardiomegaly. No visible rib fracture or pneumothorax. IMPRESSION: Perihilar opacities, likely mild edema. Cardiomegaly. Support devices as above. Electronically Signed   By: Rolm Baptise M.D.   On: 09/05/2017 10:44   Dg Abd Portable 1v  Result Date: 09/20/2017 CLINICAL DATA:  Feeding tube placement. EXAM: PORTABLE ABDOMEN - 1 VIEW COMPARISON:  09/20/2017 at 0657 hours FINDINGS: The patient is rotated to the right. A feeding tube has been placed and is partially coiled in the right upper abdomen,  likely in the distal stomach. Gas is present in nondilated loops of small and large bowel without evidence of obstruction. Sternotomy wires and ICD are partially visualized. Lumbar spondylosis is noted. IMPRESSION: Feeding tube in the distal stomach. Electronically Signed   By: Logan Bores M.D.   On: 09/20/2017 09:38   Dg Abd Portable 1v  Result Date: 09/20/2017 CLINICAL DATA:  NG tube placement. EXAM: PORTABLE ABDOMEN - 1 VIEW COMPARISON:   09/20/2017.  CT 09/14/2017. FINDINGS: NG tube is again noted looped in the distal esophagus with tip in the esophagus. Repositioning should be considered. Gallstone noted. No bowel distention. No free air. Prior scratched it prior median sternotomy. AICD noted. IMPRESSION: NG tube is again noted looped in the distal esophagus, repositioning should be considered. These results will be called to the ordering clinician or representative by the Radiologist Assistant, and communication documented in the PACS or zVision Dashboard. Electronically Signed   By: Marcello Moores  Register   On: 09/20/2017 08:45   Dg Abd Portable 1v  Result Date: 09/20/2017 CLINICAL DATA:  Nasogastric tube placement EXAM: PORTABLE ABDOMEN - 1 VIEW COMPARISON:  September 19, 2017 abdominal radiograph and CT abdomen and pelvis September 14, 2017 FINDINGS: Nasogastric tube extends to the gastroesophageal junction, where it loops with the tip directed into the esophagus. There is no bowel dilatation or air-fluid levels to suggest bowel obstruction. No free air. There is a laminated gallstone in the right abdomen measuring 1.2 x 1.0 cm. IMPRESSION: Nasogastric tube loops upon itself at the gastroesophageal junction with the tip directed into the distal esophagus. No bowel obstruction or free air. Laminated gallstone right upper abdomen. Electronically Signed   By: Lowella Grip III M.D.   On: 09/20/2017 07:36   Dg Abd Portable 1v  Result Date: 09/20/2017 CLINICAL DATA:  NG tube placement EXAM: PORTABLE ABDOMEN - 1 VIEW COMPARISON:  CT 09/14/2017, radiograph 09/13/2017 FINDINGS: Partially visualized sternotomy and cardiac pacing leads. Visible gas pattern nonobstructed. Calcified gallstone in the right upper quadrant. Esophageal tube tip projects over the proximal stomach, side-port in the region of GE junction. IMPRESSION: 1. Esophageal tube tip overlies proximal stomach, side-port in the region of GE junction, further advancement could be considered  for more optimal positioning 2. Gallstone Electronically Signed   By: Donavan Foil M.D.   On: 09/20/2017 00:28   Dg Abd Portable 1v  Result Date: 09/13/2017 CLINICAL DATA:  Orogastric tube placement EXAM: PORTABLE ABDOMEN - 1 VIEW COMPARISON:  September 11, 2017 FINDINGS: Orogastric tube tip and side port are in the distal stomach. There is moderate stool in the colon. There is no bowel dilatation or air-fluid level to suggest bowel obstruction. No free air. There is a laminated gallstone in the right upper quadrant. There is a small left pleural effusion. IMPRESSION: Orogastric tube tip and side port in distal stomach. No bowel obstruction or free air. Laminated gallstone right upper quadrant. Electronically Signed   By: Lowella Grip III M.D.   On: 09/13/2017 10:14   Dg Abd Portable 1v  Result Date: 09/11/2017 CLINICAL DATA:  Orogastric tube placement. EXAM: PORTABLE ABDOMEN - 1 VIEW COMPARISON:  CT 07/23/2015 FINDINGS: Nasogastric terminates at the distal stomach. Incompletely imaged pacer. Nonobstructive bowel-gas pattern. Vascular calcifications. 1.4 cm calcification is likely a gallstone when correlated with prior CT. IMPRESSION: Nasogastric terminating at the distal stomach. Cholelithiasis. Electronically Signed   By: Abigail Miyamoto M.D.   On: 09/11/2017 10:49    Microbiology: No results found for this or any  previous visit (from the past 240 hour(s)).   Labs: Basic Metabolic Panel: Recent Labs  Lab 09/17/17 1834  09/18/17 0403 09/18/17 1824 09/19/17 0402 09/21/17 0826 09/22/17 1124 08/28/2017 0925  NA  --    < > 148*  --  145 147* 147* 147*  K  --    < > 3.9  --  3.5 3.8 3.4* 3.4*  CL  --    < > 110  --  107 111 112* 113*  CO2  --    < > 26  --  27 25 24 24   GLUCOSE  --    < > 139*  --  140* 203* 255* 184*  BUN  --    < > 78*  --  78* 75* 72* 69*  CREATININE  --    < > 2.67*  --  2.26* 2.52* 2.11* 1.77*  CALCIUM  --    < > 8.4*  --  8.3* 8.5* 8.3* 8.4*  MG 2.2  --  2.2 2.2 2.2   --   --  2.4  PHOS 4.6  --  3.9 3.5 3.8  --   --  3.6   < > = values in this interval not displayed.   Liver Function Tests: Recent Labs  Lab 09/17/17 0243 09/18/17 0403 09/21/17 0826 09/04/2017 0925  AST 35 53* 33 34  ALT 59 63 39 34  ALKPHOS 271* 391* 435* 432*  BILITOT 1.3* 1.1 1.5* 1.4*  PROT 5.4* 5.7* 6.0* 6.1*  ALBUMIN 1.8* 1.9* 1.9* 1.9*   No results for input(s): LIPASE, AMYLASE in the last 168 hours. No results for input(s): AMMONIA in the last 168 hours. CBC: Recent Labs  Lab 09/18/17 0403 09/19/17 0402 09/21/17 0826 09/22/17 1124 08/30/2017 1021  WBC 15.4* 11.8* 10.7* 10.2 8.7  HGB 11.9* 11.0* 10.4* 10.6* 9.6*  HCT 37.1* 35.0* 33.7* 33.8* 31.5*  MCV 83.6 83.9 85.3 85.6 85.4  PLT 186 205 262 266 250   Cardiac Enzymes: No results for input(s): CKTOTAL, CKMB, CKMBINDEX, TROPONINI in the last 168 hours. BNP: BNP (last 3 results) Recent Labs    09/05/17 1018  BNP 921.9*    ProBNP (last 3 results) No results for input(s): PROBNP in the last 8760 hours.  CBG: Recent Labs  Lab 09/22/17 2032 09/22/17 2306 09/07/2017 0322 09/08/2017 0744 09/06/2017 1147  GLUCAP 163* 204* 168* 166* 183*       Signed:  Kayleen Memos, MD [facility] 09/07/2017, 1:47 PM

## 2017-09-23 NOTE — Progress Notes (Signed)
Patient is for dc to [facility] today, Pulmonary Md and attending MD notified.  Attending will do discharge summary.

## 2017-09-23 NOTE — Progress Notes (Signed)
Pt bagged to [facility] with out event.  RT given report.

## 2017-09-23 NOTE — Progress Notes (Signed)
Called by the patient's nurse, the patient is desaturating, on exam, no BS on the left.  CXR was ordered and complete opacification of the left lung noted.  Patient was bronched and mucous plugs were removed.  There was a LUL endobronchial lesion noted that we biopsied and washed.  Unfortunately, the bronchoscope available and the biopsy forceps available were not compatible and we were only able to get 2 samples.    [facility] and can be worked up there and biopsies followed upon.  Will also place findings in the bronch report.  [place] NEEDS TO FOLLOW UP ON MASS.  The patient is critically ill with multiple organ systems failure and requires high complexity decision making for assessment and support, frequent evaluation and titration of therapies, application of advanced monitoring technologies and extensive interpretation of multiple databases.   Critical Care Time devoted to patient care services described in this note is  45  Minutes. This time reflects time of care of this signee Dr [name]. This critical care time does not reflect procedure time, or teaching time or supervisory time of PA/NP/Med student/Med Resident etc but could involve care discussion time.  Rush Farmer, M.D. [facility] Pulmonary/Critical Care Medicine. Pager: [phone]. After hours pager: [phone].

## 2017-09-24 ENCOUNTER — Other Ambulatory Visit (HOSPITAL_COMMUNITY): Payer: Medicare Other

## 2017-09-24 DIAGNOSIS — Z930 Tracheostomy status: Secondary | ICD-10-CM | POA: Diagnosis not present

## 2017-09-24 DIAGNOSIS — J9690 Respiratory failure, unspecified, unspecified whether with hypoxia or hypercapnia: Secondary | ICD-10-CM | POA: Diagnosis not present

## 2017-09-24 LAB — PROTIME-INR
INR: 1.39
Prothrombin Time: 17 seconds — ABNORMAL HIGH (ref 11.4–15.2)

## 2017-09-24 LAB — BLOOD GAS, ARTERIAL
ACID-BASE DEFICIT: 0.1 mmol/L (ref 0.0–2.0)
BICARBONATE: 23.5 mmol/L (ref 20.0–28.0)
FIO2: 40
LHR: 14 {breaths}/min
O2 Saturation: 99 %
PATIENT TEMPERATURE: 98.8
PEEP/CPAP: 5 cmH2O
PO2 ART: 158 mmHg — AB (ref 83.0–108.0)
VT: 500 mL
pCO2 arterial: 35.6 mmHg (ref 32.0–48.0)
pH, Arterial: 7.437 (ref 7.350–7.450)

## 2017-09-24 LAB — COMPREHENSIVE METABOLIC PANEL
ALK PHOS: 427 U/L — AB (ref 38–126)
ALT: 33 U/L (ref 17–63)
ANION GAP: 11 (ref 5–15)
AST: 34 U/L (ref 15–41)
Albumin: 1.8 g/dL — ABNORMAL LOW (ref 3.5–5.0)
BILIRUBIN TOTAL: 1.2 mg/dL (ref 0.3–1.2)
BUN: 68 mg/dL — ABNORMAL HIGH (ref 6–20)
CALCIUM: 8.3 mg/dL — AB (ref 8.9–10.3)
CO2: 25 mmol/L (ref 22–32)
CREATININE: 1.83 mg/dL — AB (ref 0.61–1.24)
Chloride: 112 mmol/L — ABNORMAL HIGH (ref 101–111)
GFR calc non Af Amer: 38 mL/min — ABNORMAL LOW (ref >60)
GFR, EST AFRICAN AMERICAN: 44 mL/min — AB (ref >60)
GLUCOSE: 235 mg/dL — AB (ref 65–99)
Potassium: 3.7 mmol/L (ref 3.5–5.1)
Sodium: 148 mmol/L — ABNORMAL HIGH (ref 135–145)
TOTAL PROTEIN: 5.7 g/dL — AB (ref 6.5–8.1)

## 2017-09-24 LAB — C DIFFICILE QUICK SCREEN W PCR REFLEX
C DIFFICLE (CDIFF) ANTIGEN: NEGATIVE
C Diff interpretation: NOT DETECTED
C Diff toxin: NEGATIVE

## 2017-09-24 LAB — CBC WITH DIFFERENTIAL/PLATELET
BASOS ABS: 0 10*3/uL (ref 0.0–0.1)
BASOS PCT: 0 %
Eosinophils Absolute: 0.3 10*3/uL (ref 0.0–0.7)
Eosinophils Relative: 4 %
HEMATOCRIT: 35.3 % — AB (ref 39.0–52.0)
Hemoglobin: 10.7 g/dL — ABNORMAL LOW (ref 13.0–17.0)
Lymphocytes Relative: 12 %
Lymphs Abs: 1 10*3/uL (ref 0.7–4.0)
MCH: 26.3 pg (ref 26.0–34.0)
MCHC: 30.3 g/dL (ref 30.0–36.0)
MCV: 86.7 fL (ref 78.0–100.0)
MONO ABS: 0.4 10*3/uL (ref 0.1–1.0)
MONOS PCT: 4 %
NEUTROS ABS: 6.8 10*3/uL (ref 1.7–7.7)
NEUTROS PCT: 80 %
PLATELETS: 290 10*3/uL (ref 150–400)
RBC: 4.07 MIL/uL — ABNORMAL LOW (ref 4.22–5.81)
RDW: 20.6 % — AB (ref 11.5–15.5)
WBC: 8.5 10*3/uL (ref 4.0–10.5)

## 2017-09-25 LAB — CULTURE, BAL-QUANTITATIVE W GRAM STAIN: Culture: NO GROWTH

## 2017-09-25 LAB — MAGNESIUM: MAGNESIUM: 2.5 mg/dL — AB (ref 1.7–2.4)

## 2017-09-25 LAB — BASIC METABOLIC PANEL
Anion gap: 11 (ref 5–15)
BUN: 60 mg/dL — ABNORMAL HIGH (ref 6–20)
CALCIUM: 8.1 mg/dL — AB (ref 8.9–10.3)
CO2: 24 mmol/L (ref 22–32)
CREATININE: 1.7 mg/dL — AB (ref 0.61–1.24)
Chloride: 111 mmol/L (ref 101–111)
GFR, EST AFRICAN AMERICAN: 48 mL/min — AB (ref >60)
GFR, EST NON AFRICAN AMERICAN: 41 mL/min — AB (ref >60)
Glucose, Bld: 226 mg/dL — ABNORMAL HIGH (ref 65–99)
Potassium: 3.8 mmol/L (ref 3.5–5.1)
SODIUM: 146 mmol/L — AB (ref 135–145)

## 2017-09-25 LAB — CULTURE, BAL-QUANTITATIVE: SPECIAL REQUESTS: NORMAL

## 2017-09-27 LAB — BASIC METABOLIC PANEL
Anion gap: 10 (ref 5–15)
BUN: 68 mg/dL — ABNORMAL HIGH (ref 6–20)
CHLORIDE: 112 mmol/L — AB (ref 101–111)
CO2: 24 mmol/L (ref 22–32)
CREATININE: 2.08 mg/dL — AB (ref 0.61–1.24)
Calcium: 7.9 mg/dL — ABNORMAL LOW (ref 8.9–10.3)
GFR calc non Af Amer: 32 mL/min — ABNORMAL LOW (ref >60)
GFR, EST AFRICAN AMERICAN: 37 mL/min — AB (ref >60)
Glucose, Bld: 190 mg/dL — ABNORMAL HIGH (ref 65–99)
POTASSIUM: 3.5 mmol/L (ref 3.5–5.1)
Sodium: 146 mmol/L — ABNORMAL HIGH (ref 135–145)

## 2017-09-27 LAB — CULTURE, RESPIRATORY

## 2017-09-27 LAB — CULTURE, RESPIRATORY W GRAM STAIN: Culture: NORMAL

## 2017-09-27 LAB — MAGNESIUM: Magnesium: 2.2 mg/dL (ref 1.7–2.4)

## 2017-09-27 DEATH — deceased

## 2017-09-28 LAB — URINALYSIS, ROUTINE W REFLEX MICROSCOPIC
BILIRUBIN URINE: NEGATIVE
GLUCOSE, UA: NEGATIVE mg/dL
Ketones, ur: 5 mg/dL — AB
Nitrite: NEGATIVE
PROTEIN: 30 mg/dL — AB
Specific Gravity, Urine: 1.019 (ref 1.005–1.030)
pH: 6 (ref 5.0–8.0)

## 2017-09-28 LAB — BASIC METABOLIC PANEL
ANION GAP: 13 (ref 5–15)
BUN: 64 mg/dL — ABNORMAL HIGH (ref 6–20)
CHLORIDE: 112 mmol/L — AB (ref 101–111)
CO2: 23 mmol/L (ref 22–32)
Calcium: 8.1 mg/dL — ABNORMAL LOW (ref 8.9–10.3)
Creatinine, Ser: 2.01 mg/dL — ABNORMAL HIGH (ref 0.61–1.24)
GFR calc Af Amer: 39 mL/min — ABNORMAL LOW (ref >60)
GFR, EST NON AFRICAN AMERICAN: 34 mL/min — AB (ref >60)
GLUCOSE: 82 mg/dL (ref 65–99)
POTASSIUM: 3.3 mmol/L — AB (ref 3.5–5.1)
Sodium: 148 mmol/L — ABNORMAL HIGH (ref 135–145)

## 2017-09-28 LAB — CBC
HEMATOCRIT: 35.4 % — AB (ref 39.0–52.0)
HEMOGLOBIN: 10.6 g/dL — AB (ref 13.0–17.0)
MCH: 26 pg (ref 26.0–34.0)
MCHC: 29.9 g/dL — ABNORMAL LOW (ref 30.0–36.0)
MCV: 86.8 fL (ref 78.0–100.0)
Platelets: 309 10*3/uL (ref 150–400)
RBC: 4.08 MIL/uL — AB (ref 4.22–5.81)
RDW: 21.3 % — ABNORMAL HIGH (ref 11.5–15.5)
WBC: 12.2 10*3/uL — AB (ref 4.0–10.5)

## 2017-09-29 LAB — C DIFFICILE QUICK SCREEN W PCR REFLEX
C DIFFICILE (CDIFF) TOXIN: POSITIVE — AB
C DIFFICLE (CDIFF) ANTIGEN: POSITIVE — AB
C Diff interpretation: DETECTED

## 2017-09-29 LAB — URINE CULTURE: CULTURE: NO GROWTH

## 2017-09-29 LAB — POTASSIUM: Potassium: 3.7 mmol/L (ref 3.5–5.1)

## 2017-09-30 LAB — CBC
HEMATOCRIT: 36.8 % — AB (ref 39.0–52.0)
HEMOGLOBIN: 11 g/dL — AB (ref 13.0–17.0)
MCH: 26.1 pg (ref 26.0–34.0)
MCHC: 29.9 g/dL — ABNORMAL LOW (ref 30.0–36.0)
MCV: 87.4 fL (ref 78.0–100.0)
Platelets: 331 10*3/uL (ref 150–400)
RBC: 4.21 MIL/uL — ABNORMAL LOW (ref 4.22–5.81)
RDW: 21.3 % — ABNORMAL HIGH (ref 11.5–15.5)
WBC: 14.6 10*3/uL — ABNORMAL HIGH (ref 4.0–10.5)

## 2017-09-30 LAB — RENAL FUNCTION PANEL
ANION GAP: 13 (ref 5–15)
Albumin: 1.7 g/dL — ABNORMAL LOW (ref 3.5–5.0)
BUN: 60 mg/dL — ABNORMAL HIGH (ref 6–20)
CHLORIDE: 116 mmol/L — AB (ref 101–111)
CO2: 21 mmol/L — AB (ref 22–32)
Calcium: 8.2 mg/dL — ABNORMAL LOW (ref 8.9–10.3)
Creatinine, Ser: 1.91 mg/dL — ABNORMAL HIGH (ref 0.61–1.24)
GFR calc Af Amer: 41 mL/min — ABNORMAL LOW (ref >60)
GFR calc non Af Amer: 36 mL/min — ABNORMAL LOW (ref >60)
Glucose, Bld: 99 mg/dL (ref 65–99)
POTASSIUM: 3.8 mmol/L (ref 3.5–5.1)
Phosphorus: 3.6 mg/dL (ref 2.5–4.6)
Sodium: 150 mmol/L — ABNORMAL HIGH (ref 135–145)

## 2017-09-30 LAB — MAGNESIUM: Magnesium: 2.6 mg/dL — ABNORMAL HIGH (ref 1.7–2.4)

## 2017-10-01 LAB — RENAL FUNCTION PANEL
ALBUMIN: 1.7 g/dL — AB (ref 3.5–5.0)
ANION GAP: 14 (ref 5–15)
BUN: 60 mg/dL — ABNORMAL HIGH (ref 6–20)
CO2: 20 mmol/L — ABNORMAL LOW (ref 22–32)
Calcium: 8.1 mg/dL — ABNORMAL LOW (ref 8.9–10.3)
Chloride: 112 mmol/L — ABNORMAL HIGH (ref 101–111)
Creatinine, Ser: 1.83 mg/dL — ABNORMAL HIGH (ref 0.61–1.24)
GFR calc Af Amer: 44 mL/min — ABNORMAL LOW (ref >60)
GFR, EST NON AFRICAN AMERICAN: 38 mL/min — AB (ref >60)
GLUCOSE: 179 mg/dL — AB (ref 65–99)
PHOSPHORUS: 3.8 mg/dL (ref 2.5–4.6)
POTASSIUM: 3.5 mmol/L (ref 3.5–5.1)
Sodium: 146 mmol/L — ABNORMAL HIGH (ref 135–145)

## 2017-10-01 LAB — MAGNESIUM: Magnesium: 2.4 mg/dL (ref 1.7–2.4)

## 2017-10-02 LAB — CBC
HCT: 36.2 % — ABNORMAL LOW (ref 39.0–52.0)
HEMOGLOBIN: 10.9 g/dL — AB (ref 13.0–17.0)
MCH: 26.1 pg (ref 26.0–34.0)
MCHC: 30.1 g/dL (ref 30.0–36.0)
MCV: 86.6 fL (ref 78.0–100.0)
Platelets: 347 10*3/uL (ref 150–400)
RBC: 4.18 MIL/uL — AB (ref 4.22–5.81)
RDW: 21 % — ABNORMAL HIGH (ref 11.5–15.5)
WBC: 11.8 10*3/uL — ABNORMAL HIGH (ref 4.0–10.5)

## 2017-10-02 LAB — RENAL FUNCTION PANEL
ANION GAP: 14 (ref 5–15)
Albumin: 1.7 g/dL — ABNORMAL LOW (ref 3.5–5.0)
BUN: 61 mg/dL — ABNORMAL HIGH (ref 6–20)
CHLORIDE: 109 mmol/L (ref 101–111)
CO2: 20 mmol/L — AB (ref 22–32)
Calcium: 8.1 mg/dL — ABNORMAL LOW (ref 8.9–10.3)
Creatinine, Ser: 1.83 mg/dL — ABNORMAL HIGH (ref 0.61–1.24)
GFR calc non Af Amer: 38 mL/min — ABNORMAL LOW (ref >60)
GFR, EST AFRICAN AMERICAN: 44 mL/min — AB (ref >60)
GLUCOSE: 188 mg/dL — AB (ref 65–99)
POTASSIUM: 3.8 mmol/L (ref 3.5–5.1)
Phosphorus: 3.8 mg/dL (ref 2.5–4.6)
Sodium: 143 mmol/L (ref 135–145)

## 2017-10-02 LAB — MAGNESIUM: Magnesium: 2.5 mg/dL — ABNORMAL HIGH (ref 1.7–2.4)

## 2017-10-04 LAB — RENAL FUNCTION PANEL
ALBUMIN: 1.6 g/dL — AB (ref 3.5–5.0)
Anion gap: 12 (ref 5–15)
BUN: 68 mg/dL — AB (ref 6–20)
CHLORIDE: 111 mmol/L (ref 101–111)
CO2: 20 mmol/L — ABNORMAL LOW (ref 22–32)
CREATININE: 2.14 mg/dL — AB (ref 0.61–1.24)
Calcium: 8 mg/dL — ABNORMAL LOW (ref 8.9–10.3)
GFR calc Af Amer: 36 mL/min — ABNORMAL LOW (ref >60)
GFR calc non Af Amer: 31 mL/min — ABNORMAL LOW (ref >60)
Glucose, Bld: 154 mg/dL — ABNORMAL HIGH (ref 65–99)
Phosphorus: 4.2 mg/dL (ref 2.5–4.6)
Potassium: 3.9 mmol/L (ref 3.5–5.1)
Sodium: 143 mmol/L (ref 135–145)

## 2017-10-04 LAB — CBC
HCT: 35.4 % — ABNORMAL LOW (ref 39.0–52.0)
Hemoglobin: 10.5 g/dL — ABNORMAL LOW (ref 13.0–17.0)
MCH: 26 pg (ref 26.0–34.0)
MCHC: 29.7 g/dL — ABNORMAL LOW (ref 30.0–36.0)
MCV: 87.6 fL (ref 78.0–100.0)
PLATELETS: 326 10*3/uL (ref 150–400)
RBC: 4.04 MIL/uL — ABNORMAL LOW (ref 4.22–5.81)
RDW: 21.2 % — AB (ref 11.5–15.5)
WBC: 9.9 10*3/uL (ref 4.0–10.5)

## 2017-10-04 LAB — MAGNESIUM: MAGNESIUM: 2.6 mg/dL — AB (ref 1.7–2.4)

## 2017-10-05 LAB — RENAL FUNCTION PANEL
Albumin: 1.8 g/dL — ABNORMAL LOW (ref 3.5–5.0)
Anion gap: 12 (ref 5–15)
BUN: 67 mg/dL — ABNORMAL HIGH (ref 6–20)
CALCIUM: 8.2 mg/dL — AB (ref 8.9–10.3)
CHLORIDE: 110 mmol/L (ref 101–111)
CO2: 21 mmol/L — ABNORMAL LOW (ref 22–32)
Creatinine, Ser: 2.07 mg/dL — ABNORMAL HIGH (ref 0.61–1.24)
GFR, EST AFRICAN AMERICAN: 38 mL/min — AB (ref >60)
GFR, EST NON AFRICAN AMERICAN: 32 mL/min — AB (ref >60)
Glucose, Bld: 197 mg/dL — ABNORMAL HIGH (ref 65–99)
Phosphorus: 3.8 mg/dL (ref 2.5–4.6)
Potassium: 3.9 mmol/L (ref 3.5–5.1)
Sodium: 143 mmol/L (ref 135–145)

## 2017-10-05 LAB — CBC
HEMATOCRIT: 37.6 % — AB (ref 39.0–52.0)
HEMOGLOBIN: 11 g/dL — AB (ref 13.0–17.0)
MCH: 25.7 pg — AB (ref 26.0–34.0)
MCHC: 29.3 g/dL — ABNORMAL LOW (ref 30.0–36.0)
MCV: 87.9 fL (ref 78.0–100.0)
Platelets: 328 10*3/uL (ref 150–400)
RBC: 4.28 MIL/uL (ref 4.22–5.81)
RDW: 21 % — ABNORMAL HIGH (ref 11.5–15.5)
WBC: 11.2 10*3/uL — ABNORMAL HIGH (ref 4.0–10.5)

## 2017-10-05 LAB — MAGNESIUM: Magnesium: 2.6 mg/dL — ABNORMAL HIGH (ref 1.7–2.4)

## 2017-10-06 LAB — RENAL FUNCTION PANEL
ALBUMIN: 1.7 g/dL — AB (ref 3.5–5.0)
Anion gap: 12 (ref 5–15)
BUN: 66 mg/dL — AB (ref 6–20)
CHLORIDE: 110 mmol/L (ref 101–111)
CO2: 20 mmol/L — ABNORMAL LOW (ref 22–32)
CREATININE: 1.78 mg/dL — AB (ref 0.61–1.24)
Calcium: 8 mg/dL — ABNORMAL LOW (ref 8.9–10.3)
GFR calc Af Amer: 45 mL/min — ABNORMAL LOW (ref >60)
GFR, EST NON AFRICAN AMERICAN: 39 mL/min — AB (ref >60)
Glucose, Bld: 150 mg/dL — ABNORMAL HIGH (ref 65–99)
Phosphorus: 3.7 mg/dL (ref 2.5–4.6)
Potassium: 4 mmol/L (ref 3.5–5.1)
Sodium: 142 mmol/L (ref 135–145)

## 2017-10-06 LAB — MAGNESIUM: MAGNESIUM: 2.4 mg/dL (ref 1.7–2.4)

## 2017-10-10 ENCOUNTER — Other Ambulatory Visit (HOSPITAL_COMMUNITY): Payer: Medicare Other

## 2017-10-10 LAB — BASIC METABOLIC PANEL
Anion gap: 10 (ref 5–15)
BUN: 63 mg/dL — AB (ref 6–20)
CO2: 20 mmol/L — ABNORMAL LOW (ref 22–32)
CREATININE: 1.64 mg/dL — AB (ref 0.61–1.24)
Calcium: 8 mg/dL — ABNORMAL LOW (ref 8.9–10.3)
Chloride: 112 mmol/L — ABNORMAL HIGH (ref 101–111)
GFR calc Af Amer: 50 mL/min — ABNORMAL LOW (ref >60)
GFR, EST NON AFRICAN AMERICAN: 43 mL/min — AB (ref >60)
Glucose, Bld: 153 mg/dL — ABNORMAL HIGH (ref 65–99)
Potassium: 4.3 mmol/L (ref 3.5–5.1)
SODIUM: 142 mmol/L (ref 135–145)

## 2017-10-10 LAB — CBC
HCT: 33.7 % — ABNORMAL LOW (ref 39.0–52.0)
Hemoglobin: 10.1 g/dL — ABNORMAL LOW (ref 13.0–17.0)
MCH: 25.6 pg — ABNORMAL LOW (ref 26.0–34.0)
MCHC: 30 g/dL (ref 30.0–36.0)
MCV: 85.3 fL (ref 78.0–100.0)
PLATELETS: 267 10*3/uL (ref 150–400)
RBC: 3.95 MIL/uL — ABNORMAL LOW (ref 4.22–5.81)
RDW: 20.3 % — AB (ref 11.5–15.5)
WBC: 9.4 10*3/uL (ref 4.0–10.5)

## 2017-10-10 LAB — MAGNESIUM: MAGNESIUM: 2.3 mg/dL (ref 1.7–2.4)

## 2017-10-12 ENCOUNTER — Other Ambulatory Visit (HOSPITAL_COMMUNITY): Payer: Medicare Other

## 2017-10-12 DIAGNOSIS — J90 Pleural effusion, not elsewhere classified: Secondary | ICD-10-CM | POA: Diagnosis not present

## 2017-10-12 LAB — BLOOD GAS, ARTERIAL
Acid-base deficit: 2.6 mmol/L — ABNORMAL HIGH (ref 0.0–2.0)
Bicarbonate: 20.9 mmol/L (ref 20.0–28.0)
FIO2: 0.21
O2 Saturation: 91.1 %
Patient temperature: 98.6
pCO2 arterial: 31.3 mmHg — ABNORMAL LOW (ref 32.0–48.0)
pH, Arterial: 7.44 (ref 7.350–7.450)
pO2, Arterial: 64 mmHg — ABNORMAL LOW (ref 83.0–108.0)

## 2017-10-12 LAB — URINALYSIS, MICROSCOPIC (REFLEX)

## 2017-10-12 LAB — BASIC METABOLIC PANEL
Anion gap: 11 (ref 5–15)
BUN: 54 mg/dL — ABNORMAL HIGH (ref 6–20)
CO2: 20 mmol/L — ABNORMAL LOW (ref 22–32)
Calcium: 8.4 mg/dL — ABNORMAL LOW (ref 8.9–10.3)
Chloride: 113 mmol/L — ABNORMAL HIGH (ref 101–111)
Creatinine, Ser: 1.63 mg/dL — ABNORMAL HIGH (ref 0.61–1.24)
GFR calc Af Amer: 50 mL/min — ABNORMAL LOW (ref >60)
GFR calc non Af Amer: 43 mL/min — ABNORMAL LOW (ref >60)
Glucose, Bld: 75 mg/dL (ref 65–99)
Potassium: 4.8 mmol/L (ref 3.5–5.1)
Sodium: 144 mmol/L (ref 135–145)

## 2017-10-12 LAB — URINALYSIS, ROUTINE W REFLEX MICROSCOPIC
Bilirubin Urine: NEGATIVE
Glucose, UA: NEGATIVE mg/dL
Ketones, ur: NEGATIVE mg/dL
Nitrite: NEGATIVE
Protein, ur: 30 mg/dL — AB
Specific Gravity, Urine: 1.02 (ref 1.005–1.030)
pH: 5 (ref 5.0–8.0)

## 2017-10-13 LAB — URINE CULTURE: Culture: NO GROWTH

## 2017-10-14 LAB — BASIC METABOLIC PANEL
Anion gap: 11 (ref 5–15)
BUN: 54 mg/dL — ABNORMAL HIGH (ref 6–20)
CHLORIDE: 111 mmol/L (ref 101–111)
CO2: 22 mmol/L (ref 22–32)
Calcium: 8.1 mg/dL — ABNORMAL LOW (ref 8.9–10.3)
Creatinine, Ser: 1.84 mg/dL — ABNORMAL HIGH (ref 0.61–1.24)
GFR calc non Af Amer: 37 mL/min — ABNORMAL LOW (ref >60)
GFR, EST AFRICAN AMERICAN: 43 mL/min — AB (ref >60)
Glucose, Bld: 160 mg/dL — ABNORMAL HIGH (ref 65–99)
POTASSIUM: 5.2 mmol/L — AB (ref 3.5–5.1)
SODIUM: 144 mmol/L (ref 135–145)

## 2017-10-14 LAB — CBC
HEMATOCRIT: 34.7 % — AB (ref 39.0–52.0)
HEMOGLOBIN: 10.1 g/dL — AB (ref 13.0–17.0)
MCH: 25.1 pg — ABNORMAL LOW (ref 26.0–34.0)
MCHC: 29.1 g/dL — ABNORMAL LOW (ref 30.0–36.0)
MCV: 86.3 fL (ref 78.0–100.0)
Platelets: 295 10*3/uL (ref 150–400)
RBC: 4.02 MIL/uL — AB (ref 4.22–5.81)
RDW: 20.4 % — AB (ref 11.5–15.5)
WBC: 9.7 10*3/uL (ref 4.0–10.5)

## 2017-10-14 LAB — MAGNESIUM: MAGNESIUM: 2.1 mg/dL (ref 1.7–2.4)

## 2017-10-15 LAB — RENAL FUNCTION PANEL
ANION GAP: 11 (ref 5–15)
Albumin: 1.8 g/dL — ABNORMAL LOW (ref 3.5–5.0)
BUN: 48 mg/dL — ABNORMAL HIGH (ref 6–20)
CALCIUM: 8 mg/dL — AB (ref 8.9–10.3)
CO2: 22 mmol/L (ref 22–32)
Chloride: 109 mmol/L (ref 101–111)
Creatinine, Ser: 1.78 mg/dL — ABNORMAL HIGH (ref 0.61–1.24)
GFR calc non Af Amer: 39 mL/min — ABNORMAL LOW (ref >60)
GFR, EST AFRICAN AMERICAN: 45 mL/min — AB (ref >60)
Glucose, Bld: 94 mg/dL (ref 65–99)
PHOSPHORUS: 4.9 mg/dL — AB (ref 2.5–4.6)
POTASSIUM: 4.3 mmol/L (ref 3.5–5.1)
SODIUM: 142 mmol/L (ref 135–145)

## 2017-10-15 LAB — MAGNESIUM: Magnesium: 2.2 mg/dL (ref 1.7–2.4)

## 2017-10-17 LAB — RENAL FUNCTION PANEL
ANION GAP: 11 (ref 5–15)
Albumin: 1.8 g/dL — ABNORMAL LOW (ref 3.5–5.0)
BUN: 51 mg/dL — ABNORMAL HIGH (ref 6–20)
CHLORIDE: 105 mmol/L (ref 101–111)
CO2: 23 mmol/L (ref 22–32)
Calcium: 7.8 mg/dL — ABNORMAL LOW (ref 8.9–10.3)
Creatinine, Ser: 1.77 mg/dL — ABNORMAL HIGH (ref 0.61–1.24)
GFR, EST AFRICAN AMERICAN: 45 mL/min — AB (ref >60)
GFR, EST NON AFRICAN AMERICAN: 39 mL/min — AB (ref >60)
Glucose, Bld: 102 mg/dL — ABNORMAL HIGH (ref 65–99)
POTASSIUM: 4.2 mmol/L (ref 3.5–5.1)
Phosphorus: 4.9 mg/dL — ABNORMAL HIGH (ref 2.5–4.6)
Sodium: 139 mmol/L (ref 135–145)

## 2017-10-17 LAB — CBC
HEMATOCRIT: 29.5 % — AB (ref 39.0–52.0)
Hemoglobin: 8.6 g/dL — ABNORMAL LOW (ref 13.0–17.0)
MCH: 24.9 pg — AB (ref 26.0–34.0)
MCHC: 29.2 g/dL — ABNORMAL LOW (ref 30.0–36.0)
MCV: 85.3 fL (ref 78.0–100.0)
PLATELETS: 284 10*3/uL (ref 150–400)
RBC: 3.46 MIL/uL — AB (ref 4.22–5.81)
RDW: 19.6 % — ABNORMAL HIGH (ref 11.5–15.5)
WBC: 8.7 10*3/uL (ref 4.0–10.5)

## 2017-10-17 LAB — MAGNESIUM: Magnesium: 2.2 mg/dL (ref 1.7–2.4)

## 2017-10-18 LAB — C DIFFICILE QUICK SCREEN W PCR REFLEX
C DIFFICILE (CDIFF) INTERP: NOT DETECTED
C Diff antigen: NEGATIVE
C Diff toxin: NEGATIVE

## 2017-10-20 ENCOUNTER — Other Ambulatory Visit (HOSPITAL_COMMUNITY): Payer: Medicare Other

## 2017-10-20 DIAGNOSIS — N179 Acute kidney failure, unspecified: Secondary | ICD-10-CM | POA: Diagnosis not present

## 2017-10-20 LAB — CBC
HCT: 29.3 % — ABNORMAL LOW (ref 39.0–52.0)
HCT: 28 % — ABNORMAL LOW (ref 39.0–52.0)
Hemoglobin: 8.9 g/dL — ABNORMAL LOW (ref 13.0–17.0)
Hemoglobin: 8.5 g/dL — ABNORMAL LOW (ref 13.0–17.0)
MCH: 25.1 pg — AB (ref 26.0–34.0)
MCH: 25.1 pg — ABNORMAL LOW (ref 26.0–34.0)
MCHC: 30.4 g/dL (ref 30.0–36.0)
MCHC: 30.4 g/dL (ref 30.0–36.0)
MCV: 82.8 fL (ref 78.0–100.0)
MCV: 82.8 fL (ref 78.0–100.0)
PLATELETS: 290 10*3/uL (ref 150–400)
PLATELETS: 318 10*3/uL (ref 150–400)
RBC: 3.54 MIL/uL — ABNORMAL LOW (ref 4.22–5.81)
RBC: 3.38 MIL/uL — AB (ref 4.22–5.81)
RDW: 19.4 % — AB (ref 11.5–15.5)
RDW: 19.5 % — ABNORMAL HIGH (ref 11.5–15.5)
WBC: 32.6 10*3/uL — ABNORMAL HIGH (ref 4.0–10.5)
WBC: 31.1 10*3/uL — AB (ref 4.0–10.5)

## 2017-10-20 LAB — RENAL FUNCTION PANEL
ALBUMIN: 1.7 g/dL — AB (ref 3.5–5.0)
Albumin: 1.8 g/dL — ABNORMAL LOW (ref 3.5–5.0)
Anion gap: 13 (ref 5–15)
Anion gap: 13 (ref 5–15)
BUN: 75 mg/dL — AB (ref 6–20)
BUN: 78 mg/dL — ABNORMAL HIGH (ref 6–20)
CALCIUM: 7.4 mg/dL — AB (ref 8.9–10.3)
CHLORIDE: 101 mmol/L (ref 101–111)
CO2: 20 mmol/L — ABNORMAL LOW (ref 22–32)
CO2: 19 mmol/L — ABNORMAL LOW (ref 22–32)
CREATININE: 3.51 mg/dL — AB (ref 0.61–1.24)
Calcium: 7.4 mg/dL — ABNORMAL LOW (ref 8.9–10.3)
Chloride: 104 mmol/L (ref 101–111)
Creatinine, Ser: 3.37 mg/dL — ABNORMAL HIGH (ref 0.61–1.24)
GFR calc Af Amer: 21 mL/min — ABNORMAL LOW (ref >60)
GFR calc non Af Amer: 18 mL/min — ABNORMAL LOW (ref >60)
GFR, EST AFRICAN AMERICAN: 20 mL/min — AB (ref >60)
GFR, EST NON AFRICAN AMERICAN: 17 mL/min — AB (ref >60)
Glucose, Bld: 129 mg/dL — ABNORMAL HIGH (ref 65–99)
Glucose, Bld: 160 mg/dL — ABNORMAL HIGH (ref 65–99)
PHOSPHORUS: 7 mg/dL — AB (ref 2.5–4.6)
POTASSIUM: 4.2 mmol/L (ref 3.5–5.1)
Phosphorus: 6.8 mg/dL — ABNORMAL HIGH (ref 2.5–4.6)
Potassium: 4.1 mmol/L (ref 3.5–5.1)
SODIUM: 136 mmol/L (ref 135–145)
Sodium: 134 mmol/L — ABNORMAL LOW (ref 135–145)

## 2017-10-20 LAB — C DIFFICILE QUICK SCREEN W PCR REFLEX
C DIFFICLE (CDIFF) ANTIGEN: POSITIVE — AB
C Diff interpretation: DETECTED
C Diff toxin: POSITIVE — AB

## 2017-10-20 LAB — URINALYSIS, ROUTINE W REFLEX MICROSCOPIC
Bilirubin Urine: NEGATIVE
Glucose, UA: NEGATIVE mg/dL
Ketones, ur: NEGATIVE mg/dL
Nitrite: NEGATIVE
PH: 5 (ref 5.0–8.0)
Protein, ur: 30 mg/dL — AB
SPECIFIC GRAVITY, URINE: 1.012 (ref 1.005–1.030)

## 2017-10-20 LAB — MAGNESIUM
MAGNESIUM: 2.5 mg/dL — AB (ref 1.7–2.4)
Magnesium: 2.6 mg/dL — ABNORMAL HIGH (ref 1.7–2.4)

## 2017-10-21 LAB — RENAL FUNCTION PANEL
Albumin: 1.7 g/dL — ABNORMAL LOW (ref 3.5–5.0)
Anion gap: 14 (ref 5–15)
BUN: 87 mg/dL — AB (ref 6–20)
CHLORIDE: 105 mmol/L (ref 101–111)
CO2: 17 mmol/L — ABNORMAL LOW (ref 22–32)
CREATININE: 4.05 mg/dL — AB (ref 0.61–1.24)
Calcium: 7.1 mg/dL — ABNORMAL LOW (ref 8.9–10.3)
GFR calc Af Amer: 17 mL/min — ABNORMAL LOW (ref >60)
GFR calc non Af Amer: 14 mL/min — ABNORMAL LOW (ref >60)
Glucose, Bld: 86 mg/dL (ref 65–99)
POTASSIUM: 4.4 mmol/L (ref 3.5–5.1)
Phosphorus: 7.2 mg/dL — ABNORMAL HIGH (ref 2.5–4.6)
Sodium: 136 mmol/L (ref 135–145)

## 2017-10-21 LAB — MAGNESIUM: Magnesium: 2.6 mg/dL — ABNORMAL HIGH (ref 1.7–2.4)

## 2017-10-21 LAB — CBC
HEMATOCRIT: 27.9 % — AB (ref 39.0–52.0)
Hemoglobin: 8.4 g/dL — ABNORMAL LOW (ref 13.0–17.0)
MCH: 24.5 pg — AB (ref 26.0–34.0)
MCHC: 30.1 g/dL (ref 30.0–36.0)
MCV: 81.3 fL (ref 78.0–100.0)
PLATELETS: 291 10*3/uL (ref 150–400)
RBC: 3.43 MIL/uL — ABNORMAL LOW (ref 4.22–5.81)
RDW: 19.1 % — AB (ref 11.5–15.5)
WBC: 26.1 10*3/uL — ABNORMAL HIGH (ref 4.0–10.5)

## 2017-10-21 LAB — LACTIC ACID, PLASMA: LACTIC ACID, VENOUS: 1.9 mmol/L (ref 0.5–1.9)

## 2017-10-21 NOTE — Consult Note (Signed)
[facility] CONSULT NOTE    Date: 10/21/2017                  Patient Name:  Mark Martin  MRN: [id number]  DOB: [birth year]  Age / Sex: 62 y.o., male         PCP: Garwin Brothers, MD                 Service Requesting Consult: Hospitalist                 Reason for Consult: Acute renal failure/CKD stage III            History of Present Illness: Patient is a 62 y.o. male with a PMHx of congestive heart failure with ejection fraction of 15%, history of acute respiratory failure status post tracheostomy decannulation, cardiac arrest, coronary artery disease status post 5 vessel CABG, status post ICD placement, diabetes mellitus type 2, hypertension, chronic kidney disease stage III who was admitted to [facility] on 09/06/2017 for on going treatment.  Patient was originally admitted to [facility] on September 05, 2017 when he had a witnessed cardiac arrest.  He had multiple episodes of PEA requiring epinephrine.  He was admitted to the intensive care unit where he remained on the ventilator.  He had tracheostomy performed on September 19, 2017.  We are asked to see the patient now for worsening acute renal failure.  Urine output was reported to be 200 cc over the preceding 24 hours.  Creatinine is up to 4.05 with a BUN up to 87 today.  EGFR currently down to 14.  Patient's wife is at bedside and we talked about treatment including temporary renal replacement therapy.  Patient's wife was agreeable to this.   Medications: Outpatient medications: Medications Prior to Admission  Medication Sig Dispense Refill Last Dose  . albuterol (PROVENTIL HFA;VENTOLIN HFA) 108 (90 BASE) MCG/ACT inhaler Inhale 2 puffs into the lungs every 6 (six) hours as needed for wheezing or shortness of breath.    09/04/2017 at prn  . amiodarone (PACERONE) 200 MG tablet Place 1 tablet (200 mg total) into feeding tube daily. 30 tablet 0   . aspirin EC 81 MG tablet Take 81 mg by mouth daily.   09/04/2017 at  Unknown time  . atorvastatin (LIPITOR) 40 MG tablet Place 1 tablet (40 mg total) into feeding tube daily at 6 PM. 30 tablet 0   . Chlorhexidine Gluconate Cloth 2 % PADS Apply 6 each topically daily. 12 each 0   . furosemide (LASIX) 40 MG tablet Take 40 mg by mouth 2 (two) times daily.  4 09/04/2017 at Unknown time  . Hydrocortisone (GERHARDT'S BUTT CREAM) CREA Apply 1 application topically 4 (four) times daily as needed for irritation (in peri area). 1 each 0   . Insulin Glargine (TOUJEO SOLOSTAR) 300 UNIT/ML SOPN Inject 50 Units into the skin daily.   09/04/2017 at Unknown time  . isosorbide mononitrate (IMDUR) 30 MG 24 hr tablet Take 30 mg by mouth every morning.  2 09/04/2017 at Unknown time  . nitroGLYCERIN (NITROSTAT) 0.4 MG SL tablet Place 0.4 mg under the tongue as needed.   unknown at prn  . Nutritional Supplements (FEEDING SUPPLEMENT, VITAL HIGH PROTEIN,) LIQD liquid Place 1,000 mLs into feeding tube daily. 1000 mL 0   . QUEtiapine (SEROQUEL) 50 MG tablet Take 1 tablet (50 mg total) by mouth daily. 30 tablet 0   . spironolactone (ALDACTONE) 25 MG tablet Take 25  mg by mouth every other day.   09/04/2017 at Unknown time  . tamsulosin (FLOMAX) 0.4 MG CAPS capsule Take 0.4 mg by mouth daily.   09/04/2017 at Unknown time  . ticagrelor (BRILINTA) 90 MG TABS tablet Take 90 mg by mouth 2 (two) times daily.   09/04/2017 at Unknown time  . Water For Irrigation, Sterile (FREE WATER) SOLN Place 300 mLs into feeding tube every 6 (six) hours. 1000 mL 0     Current medications: 0.9 normal saline at 100 cc/h, metronidazole 500 mg every 8 hours, amiodarone 200 mg daily, aspirin 81 mg daily, Lipitor 40 mg nightly, Brilinta 90 mg twice daily, Pepcid 20 mill grams twice daily, finasteride 5 mg daily, vancomycin 250 mg p.o. every 6 hours, fish oil 4 g daily, Mucinex 200 mg every 6 hours, isosorbide dinitrate 10 mg 3 times daily, Lantus 20 units subcutaneous twice daily, melatonin 3 mg nightly, renal vitamin 1 tablet daily,  probiotic 1 tablet twice daily, Zoloft 25 mg daily, Flomax 0.4 mg daily  Allergies: No Known Allergies    Past Medical History: Past Medical History:  Diagnosis Date  . AICD (automatic cardioverter/defibrillator) present   . Ankle injury    RIGHT  . Arthritis   . BPH (benign prostatic hypertrophy)   . Cancer ([place])   . Coronary artery disease   . Diabetes mellitus without complication ([place])    TYPE 2  . Dysrhythmia   . Glaucoma   . H/O: knee surgery    2 PINS IN RIGHT KNEE AND IN RIGHT ANKLE  . Hyperlipidemia   . Hypertension   . Mass of lung    RIGHT UPPER LOBE  . Myocardial infarction ([place])   . 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 Shortness of breath    WITH EXERTION     Past Surgical History: Past Surgical History:  Procedure Laterality Date  . APPENDECTOMY    . CORONARY ARTERY BYPASS GRAFT     CABG X 5 08/14/2001  . EYE SURGERY Left    CATARACT  . INSERT / REPLACE / REMOVE PACEMAKER    . THORACOTOMY/LOBECTOMY Left 06/02/2013   Procedure: THORACOTOMY/LOBECTOMY;  Surgeon: Grace Isaac, MD;  Location: [place];  Service: Thoracic;  Laterality: Left;  Marland Kitchen VIDEO ASSISTED THORACOSCOPY (VATS)/WEDGE RESECTION Left 06/02/2013   Procedure: VIDEO ASSISTED THORACOSCOPY (VATS)/WEDGE RESECTION;  Surgeon: Grace Isaac, MD;  Location: [place];  Service: Thoracic;  Laterality: Left;  Marland Kitchen VIDEO BRONCHOSCOPY N/A 06/02/2013   Procedure: VIDEO BRONCHOSCOPY;  Surgeon: Grace Isaac, MD;  Location: [facility] OR;  Service: Thoracic;  Laterality: N/A;     Family History: Family History  Problem Relation Age of Onset  . Diabetes Mother   . Hypertension Father      Social History: Social History   Socioeconomic History  . Marital status: Married    Spouse name: Not on file  . Number of children: 3  . Years of education: Not on file  . Highest education level: Not on file   Social Needs  . Financial resource strain: Not on file  . Food insecurity - worry: Not on file  . Food insecurity - inability: Not on file  . Transportation needs - medical: Not on file  . Transportation needs - non-medical: Not on file  Occupational History  Employer: [employer]  Tobacco Use  . Smoking status: Never Smoker  . Smokeless tobacco: Never Used  Substance and Sexual Activity  . Alcohol use: No  . Drug use: No  . Sexual activity: Not Currently  Other Topics Concern  . Not on file  Social History Narrative  . Not on file     Review of Systems: Patient cannot offer as he is confused.  Vital Signs: Temperature 97.1 pulse 71 respirations 26 blood pressure 132/55  Weight trends: There were no vitals filed for this visit.  Physical Exam: General: Chronically ill appearing  Head: Normocephalic, atraumatic.  Eyes: Anicteric, EOMI  Nose: Mucous membranes dry, not inflammed, nonerythematous.  Throat: Oropharynx nonerythematous, oral mucosa very dry  Neck: Supple, trachea midline.  Lungs:  Normal respiratory effort. Scattered rhonchi  Heart: S1S2 no rubs  Abdomen:  BS normoactive. Soft, Nondistended, non-tender.  No masses or organomegaly.  Extremities: No pretibial edema.  Neurologic: Arousable, but not following commands  Skin: No visible rashes, scars.    Lab results: Basic Metabolic Panel: Recent Labs  Lab 10/15/17 0630 10/17/17 0544 10/20/17 0438 10/20/17 1032 10/21/17 0844  NA 142 139 134* 136 136  K 4.3 4.2 4.2 4.1 4.4  CL 109 105 101 104 105  CO2 22 23 20* 19* 17*  GLUCOSE 94 102* 129* 160* 86  BUN 48* 51* 75* 78* 87*  CREATININE 1.78* 1.77* 3.37* 3.51* 4.05*  CALCIUM 8.0* 7.8* 7.4* 7.4* 7.1*  MG 2.2 2.2 2.6* 2.5* 2.6*  PHOS 4.9* 4.9* 6.8* 7.0* 7.2*    Liver Function Tests: Recent Labs  Lab 10/20/17 0438 10/20/17 1032 10/21/17 0844  ALBUMIN 1.8* 1.7* 1.7*   No results for input(s): LIPASE, AMYLASE in the last 168 hours. No  results for input(s): AMMONIA in the last 168 hours.  CBC: Recent Labs  Lab 10/17/17 0544 10/20/17 0438 10/20/17 1032 10/21/17 0844  WBC 8.7 32.6* 31.1* 26.1*  HGB 8.6* 8.9* 8.5* 8.4*  HCT 29.5* 29.3* 28.0* 27.9*  MCV 85.3 82.8 82.8 81.3  PLT 284 290 318 291    Cardiac Enzymes: No results for input(s): CKTOTAL, CKMB, CKMBINDEX, TROPONINI in the last 168 hours.  BNP: Invalid input(s): POCBNP  CBG: No results for input(s): GLUCAP in the last 168 hours.  Microbiology: Results for orders placed or performed during the hospital encounter of 09/12/2017  C difficile quick scan w PCR reflex     Status: None   Collection Time: 09/24/17  6:52 AM  Result Value Ref Range Status   C Diff antigen NEGATIVE NEGATIVE Final   C Diff toxin NEGATIVE NEGATIVE Final   C Diff interpretation No C. difficile detected.  Final  Culture, respiratory (NON-Expectorated)     Status: None   Collection Time: 09/25/17  3:15 PM  Result Value Ref Range Status   Specimen Description TRACHEAL ASPIRATE  Final   Special Requests NONE  Final   Gram Stain   Final    ABUNDANT WBC PRESENT,BOTH PMN AND MONONUCLEAR RARE GRAM POSITIVE COCCI    Culture   Final    Consistent with normal respiratory flora. Performed at [facility], [place] [address] [place], [place] [address]    Report Status 09/27/2017 FINAL  Final  Culture, Urine     Status: None   Collection Time: 09/28/17  2:14 PM  Result Value Ref Range Status   Specimen Description URINE, RANDOM  Final   Special Requests NONE  Final   Culture   Final  NO GROWTH Performed at [facility], [place] [address], [place], [place] [address]    Report Status 09/29/2017 FINAL  Final  C difficile quick scan w PCR reflex     Status: Abnormal   Collection Time: 09/29/17 12:35 PM  Result Value Ref Range Status   C Diff antigen POSITIVE (A) NEGATIVE Final   C Diff toxin POSITIVE (A) NEGATIVE Final   C Diff interpretation Toxin producing C. difficile  detected.  Final    Comment: CRITICAL RESULT CALLED TO, READ BACK BY AND VERIFIED WITH: C ROBERT,RN AT 1555 09/29/17 BY L BENFIELD Performed at [facility], [place] [address] [place], [place] [address]   Culture, Urine     Status: None   Collection Time: 10/12/17  1:58 PM  Result Value Ref Range Status   Specimen Description URINE, RANDOM  Final   Special Requests NONE  Final   Culture   Final    NO GROWTH Performed at [facility], [place] [address], [place], [place] [address]    Report Status 10/13/2017 FINAL  Final  C difficile quick screen w PCR reflex     Status: None   Collection Time: 10/17/17  6:30 PM  Result Value Ref Range Status   C Diff antigen NEGATIVE NEGATIVE Final   C Diff toxin NEGATIVE NEGATIVE Final   C Diff interpretation No C. difficile detected.  Final    Comment: Performed at [facility], [place] [address], [place], [place] [address]  Culture, blood (routine x 2)     Status: None (Preliminary result)   Collection Time: 10/20/17 12:00 PM  Result Value Ref Range Status   Specimen Description BLOOD LEFT HAND  Final   Special Requests   Final    BOTTLES DRAWN AEROBIC ONLY Blood Culture adequate volume   Culture   Final    NO GROWTH 1 DAY Performed at [facility], [place] [address] [place], [place] [address]    Report Status PENDING  Incomplete  Culture, blood (routine x 2)     Status: None (Preliminary result)   Collection Time: 10/20/17 12:07 PM  Result Value Ref Range Status   Specimen Description BLOOD LEFT HAND  Final   Special Requests   Final    BOTTLES DRAWN AEROBIC ONLY Blood Culture adequate volume   Culture   Final    NO GROWTH 1 DAY Performed at [facility], [place] [address] [place], [place] [address]    Report Status PENDING  Incomplete  C difficile quick scan w PCR reflex     Status: Abnormal   Collection Time: 10/20/17  2:30 PM  Result Value Ref Range Status   C Diff antigen POSITIVE (A) NEGATIVE Final   C Diff  toxin POSITIVE (A) NEGATIVE Final   C Diff interpretation Toxin producing C. difficile detected.  Final    Comment: CRITICAL RESULT CALLED TO, READ BACK BY AND VERIFIED WITH: Dellia Nims AT 1645 10/20/17 BY L BENFIELD Performed at [facility], [place] [address], [place], [place] [address]   Culture, Urine     Status: Abnormal (Preliminary result)   Collection Time: 10/20/17  2:41 PM  Result Value Ref Range Status   Specimen Description URINE, CATHETERIZED  Final   Special Requests   Final    Normal Performed at [facility], [place] [address], [place], [place] [address]    Culture >=100,000 COLONIES/mL GRAM NEGATIVE RODS (A)  Final   Report Status PENDING  Incomplete    Coagulation Studies: No results for input(s): LABPROT, INR in the last 72 hours.  Urinalysis: Recent Labs    10/20/17 1130  COLORURINE AMBER*  LABSPEC 1.012  PHURINE 5.0  GLUCOSEU NEGATIVE  HGBUR LARGE*  BILIRUBINUR NEGATIVE  KETONESUR NEGATIVE  PROTEINUR 30*  NITRITE NEGATIVE  LEUKOCYTESUR LARGE*      Imaging: US Renal  Result Date: 10/20/2017 CLINICAL DATA:  Acute kidney injury EXAM: RENAL / URINARY TRACT ULTRASOUND COMPLETE COMPARISON:  CT 09/14/2017 FINDINGS: Right Kidney: Length: 10.8 cm. Echogenicity within normal limits. No mass or hydronephrosis visualized. Left Kidney: Length: 10.9 cm. Echogenicity within normal limits. No mass or hydronephrosis visualized. Bladder: Foley catheter in place, decompressed. IMPRESSION: No acute findings. Electronically Signed   By: Rolm Baptise M.D.   On: 10/20/2017 20:32      Assessment & Plan: Pt is a 62 y.o. male with a PMHx of congestive heart failure with ejection fraction of 15%, history of acute respiratory failure status post tracheostomy decannulation, cardiac arrest, coronary artery disease status post 5 vessel CABG, status post ICD placement, diabetes mellitus type 2, hypertension, chronic kidney disease stage III who was admitted to [facility] on  08/29/2017 for on going treatment.   Patient originally admitted to [facility] on September 05, 2017 with witnessed cardiac arrest.  He had prolonged hospital course and had tracheostomy placement but is decannulated now.  Patient seen now for acute renal failure.  1.  Acute renal failure suspect secondary to diarrhea and diuretics. 2.  Acute respiratory failure. 3.  Chronic systolic heart failure. 4.  Metabolic acidosis.  Plan: The patient's renal function has been worsening.  BUN is up to 87 with a creatinine of 4.05.  Patient has oliguria as well.  Given the number of medical issues the patient has we recommend a trial of hemodialysis.  This was discussed in depth with the patient's wife.  After discussing risks, benefits, and alternatives patient's wife wishes to proceed with renal placement therapy.  Therefore we will request interventional radiology to place a temporary dialysis catheter.  We will begin hemodialysis starting tomorrow.  Further plan as patient progresses.

## 2017-10-22 ENCOUNTER — Other Ambulatory Visit (HOSPITAL_COMMUNITY): Payer: Medicare Other

## 2017-10-22 ENCOUNTER — Encounter (HOSPITAL_COMMUNITY): Payer: Self-pay | Admitting: Interventional Radiology

## 2017-10-22 DIAGNOSIS — N19 Unspecified kidney failure: Secondary | ICD-10-CM | POA: Diagnosis not present

## 2017-10-22 DIAGNOSIS — Z4901 Encounter for fitting and adjustment of extracorporeal dialysis catheter: Secondary | ICD-10-CM | POA: Diagnosis not present

## 2017-10-22 HISTORY — PX: IR FLUORO GUIDE CV LINE RIGHT: IMG2283

## 2017-10-22 HISTORY — PX: IR US GUIDE VASC ACCESS RIGHT: IMG2390

## 2017-10-22 LAB — URINE CULTURE
Culture: >=100000 — AB
SPECIAL REQUESTS: NORMAL

## 2017-10-22 LAB — CBC
HEMATOCRIT: 26.5 % — AB (ref 39.0–52.0)
Hemoglobin: 7.9 g/dL — ABNORMAL LOW (ref 13.0–17.0)
MCH: 24 pg — AB (ref 26.0–34.0)
MCHC: 29.8 g/dL — AB (ref 30.0–36.0)
MCV: 80.5 fL (ref 78.0–100.0)
PLATELETS: 259 10*3/uL (ref 150–400)
RBC: 3.29 MIL/uL — ABNORMAL LOW (ref 4.22–5.81)
RDW: 18.8 % — AB (ref 11.5–15.5)
WBC: 22 10*3/uL — ABNORMAL HIGH (ref 4.0–10.5)

## 2017-10-22 LAB — PHOSPHORUS: PHOSPHORUS: 7.6 mg/dL — AB (ref 2.5–4.6)

## 2017-10-22 LAB — COMPREHENSIVE METABOLIC PANEL
ALT: 120 U/L — ABNORMAL HIGH (ref 17–63)
ANION GAP: 12 (ref 5–15)
AST: 97 U/L — AB (ref 15–41)
Albumin: 1.5 g/dL — ABNORMAL LOW (ref 3.5–5.0)
Alkaline Phosphatase: 561 U/L — ABNORMAL HIGH (ref 38–126)
BILIRUBIN TOTAL: 1.2 mg/dL (ref 0.3–1.2)
BUN: 87 mg/dL — AB (ref 6–20)
CHLORIDE: 103 mmol/L (ref 101–111)
CO2: 17 mmol/L — ABNORMAL LOW (ref 22–32)
Calcium: 6.6 mg/dL — ABNORMAL LOW (ref 8.9–10.3)
Creatinine, Ser: 4.39 mg/dL — ABNORMAL HIGH (ref 0.61–1.24)
GFR, EST AFRICAN AMERICAN: 15 mL/min — AB (ref >60)
GFR, EST NON AFRICAN AMERICAN: 13 mL/min — AB (ref >60)
Glucose, Bld: 244 mg/dL — ABNORMAL HIGH (ref 65–99)
POTASSIUM: 3.9 mmol/L (ref 3.5–5.1)
Sodium: 132 mmol/L — ABNORMAL LOW (ref 135–145)
TOTAL PROTEIN: 5.4 g/dL — AB (ref 6.5–8.1)

## 2017-10-22 LAB — MAGNESIUM: Magnesium: 2.4 mg/dL (ref 1.7–2.4)

## 2017-10-22 LAB — CK: Total CK: 882 U/L — ABNORMAL HIGH (ref 49–397)

## 2017-10-22 MED ORDER — LIDOCAINE HCL 1 % IJ SOLN
INTRAMUSCULAR | Status: DC
Start: 2017-10-22 — End: 2017-10-23
  Filled 2017-10-22: qty 20

## 2017-10-22 MED ORDER — LIDOCAINE HCL (PF) 1 % IJ SOLN
INTRAMUSCULAR | Status: DC | PRN
  Administered 2017-10-22: 5 mL

## 2017-10-22 MED ORDER — HEPARIN SODIUM (PORCINE) 1000 UNIT/ML IJ SOLN
INTRAMUSCULAR | Status: AC
  Administered 2017-10-22: 2.8 mL
  Filled 2017-10-22: qty 1

## 2017-10-22 MED FILL — Medication: Qty: 2 | Status: AC

## 2017-10-23 LAB — HEPATITIS B SURFACE ANTIBODY,QUALITATIVE: HEP B S AB: NONREACTIVE

## 2017-10-23 LAB — HEPATITIS B SURFACE ANTIGEN: Hepatitis B Surface Ag: NEGATIVE

## 2017-10-23 LAB — HEPATITIS B CORE ANTIBODY, TOTAL: HEP B C TOTAL AB: NEGATIVE

## 2017-10-25 LAB — CULTURE, BLOOD (ROUTINE X 2)
CULTURE: NO GROWTH
Culture: NO GROWTH
SPECIAL REQUESTS: ADEQUATE
SPECIAL REQUESTS: ADEQUATE

## 2017-10-25 NOTE — Progress Notes (Signed)
This Probation officer has been informed  from [facility] staff pt back from HD cath placement and per notes from Johny Shears, DO . Right IJ HD cath ready for immediate use. Received reports from Primary nurse that pt has been hypotensive in 29'B systolically post HD cath placement., Nephrologist made aware of pt current situation in regards with mentation and hypotension and received order to give  25grms albumin 25%. Per nephrologist to go ahead of hemodialysis treatment d/t pt's renal status.  Hemodialysis consent signed by spouse and discussed with significant other possible sign and symptoms of adverse reactions. and verbalized understanding. V/S taken prior to treatment T97.8, P66, RR18 BP 88/52. Approximated 1732 Hemodialysis treatment initiated and bolus 268ml; of saline. with no s/sx of adverse rxns.. 25 grms albumin given into the tx. BP increased to 93/42. Pt lethargic but responsive verbally.  Bed locked, 3siderails up, call bell within reach and bed in low position. Approximately 56mins into HD treatment pt was noted unresponsive, wife at bedside. Code blue called approxmatred 2841, rinsed back blood immediately and bolus 800cc normal saline,at the same time CPR initiated. Code blue team arrived . Priya NP and Dr.Brown at bedside.( see code blue notes for details).Approximated 3244 code blue ceased per family request.. Pt went to PEA. Nephrologist made aware.

## 2017-10-25 NOTE — Progress Notes (Signed)
Patient ID: Mark Martin, male   DOB: [birth year], 62 y.o.   MRN: [id number]   Request received for temporary dialysis catheter placement  UOP low Creatinine rising 4.05- 4.39  Renal MD note states he has discussed with wife  She is agreeable to temp cath and dialysis  Consent is in chart signed by 2 RNs and daughter

## 2017-10-25 NOTE — Procedures (Signed)
Interventional Radiology Procedure Note  Procedure: Placement of a right IJ approach HD catheter, with 3rd lumen.  Tip is positioned at the superior cavoatrial junction and catheter is ready for immediate use.  Complications: None Recommendations:  - Ok to use - Do not submerge  - Routine line care  - OK for conversion to tunneled if needed  Signed,  Dulcy Fanny. Earleen Newport, DO

## 2017-10-25 DEATH — deceased

## 2019-04-29 IMAGING — DX DG CHEST 1V PORT
1 series · 1 of 1 positions shown · non-contrast
Comparison: 04/23/2017

CLINICAL DATA: Post CPR

EXAM:
PORTABLE CHEST 1 VIEW

[chest ap]
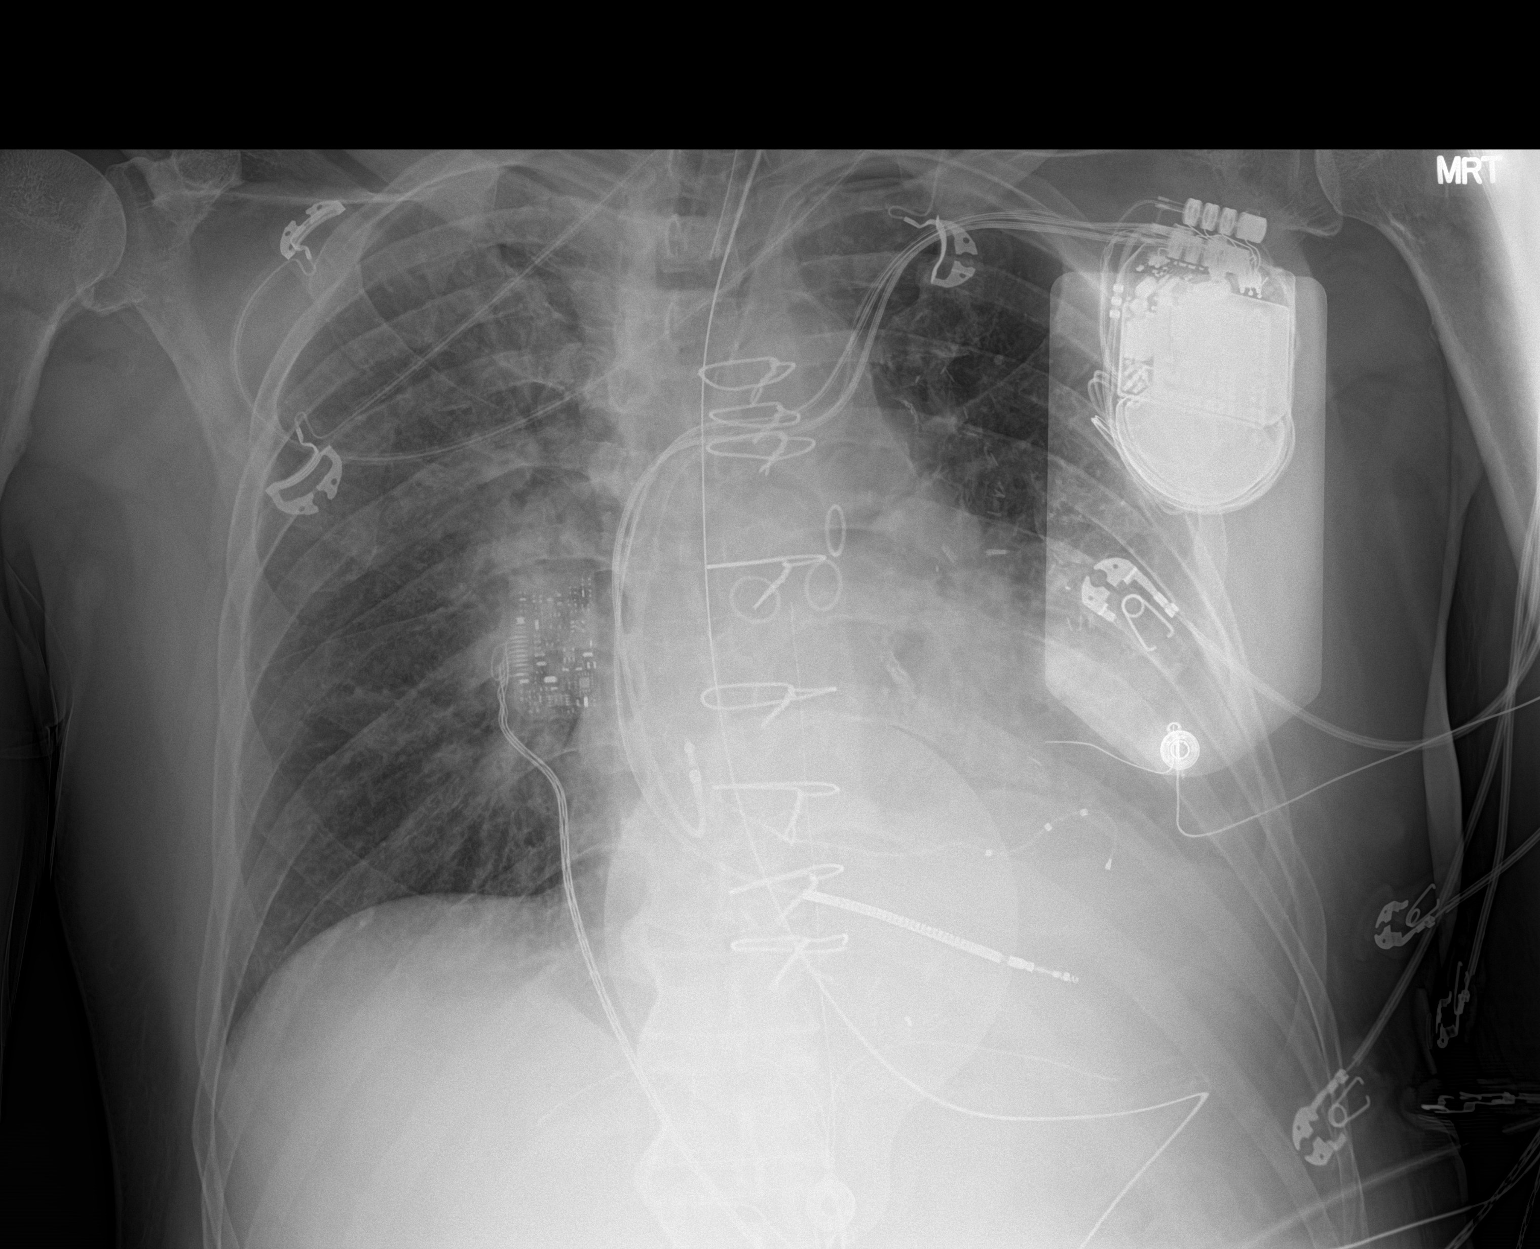

[1 of 1 positions shown; findings below may reference images not displayed]

FINDINGS: Endotracheal tube is 6 cm above the carina. NG tube enters the
stomach. Left AICD remains in place, unchanged. Prior CABG.
Bilateral perihilar opacities, likely mild edema. Cardiomegaly. No
visible rib fracture or pneumothorax.
IMPRESSION: Perihilar opacities, likely mild edema.

Cardiomegaly.

Support devices as above.

## 2019-04-30 IMAGING — DX DG CHEST 1V PORT
1 series · 1 of 1 positions shown · non-contrast
Comparison: 09/05/2017

CLINICAL DATA: Respiratory difficulty

EXAM:
PORTABLE CHEST 1 VIEW

[chest ap]
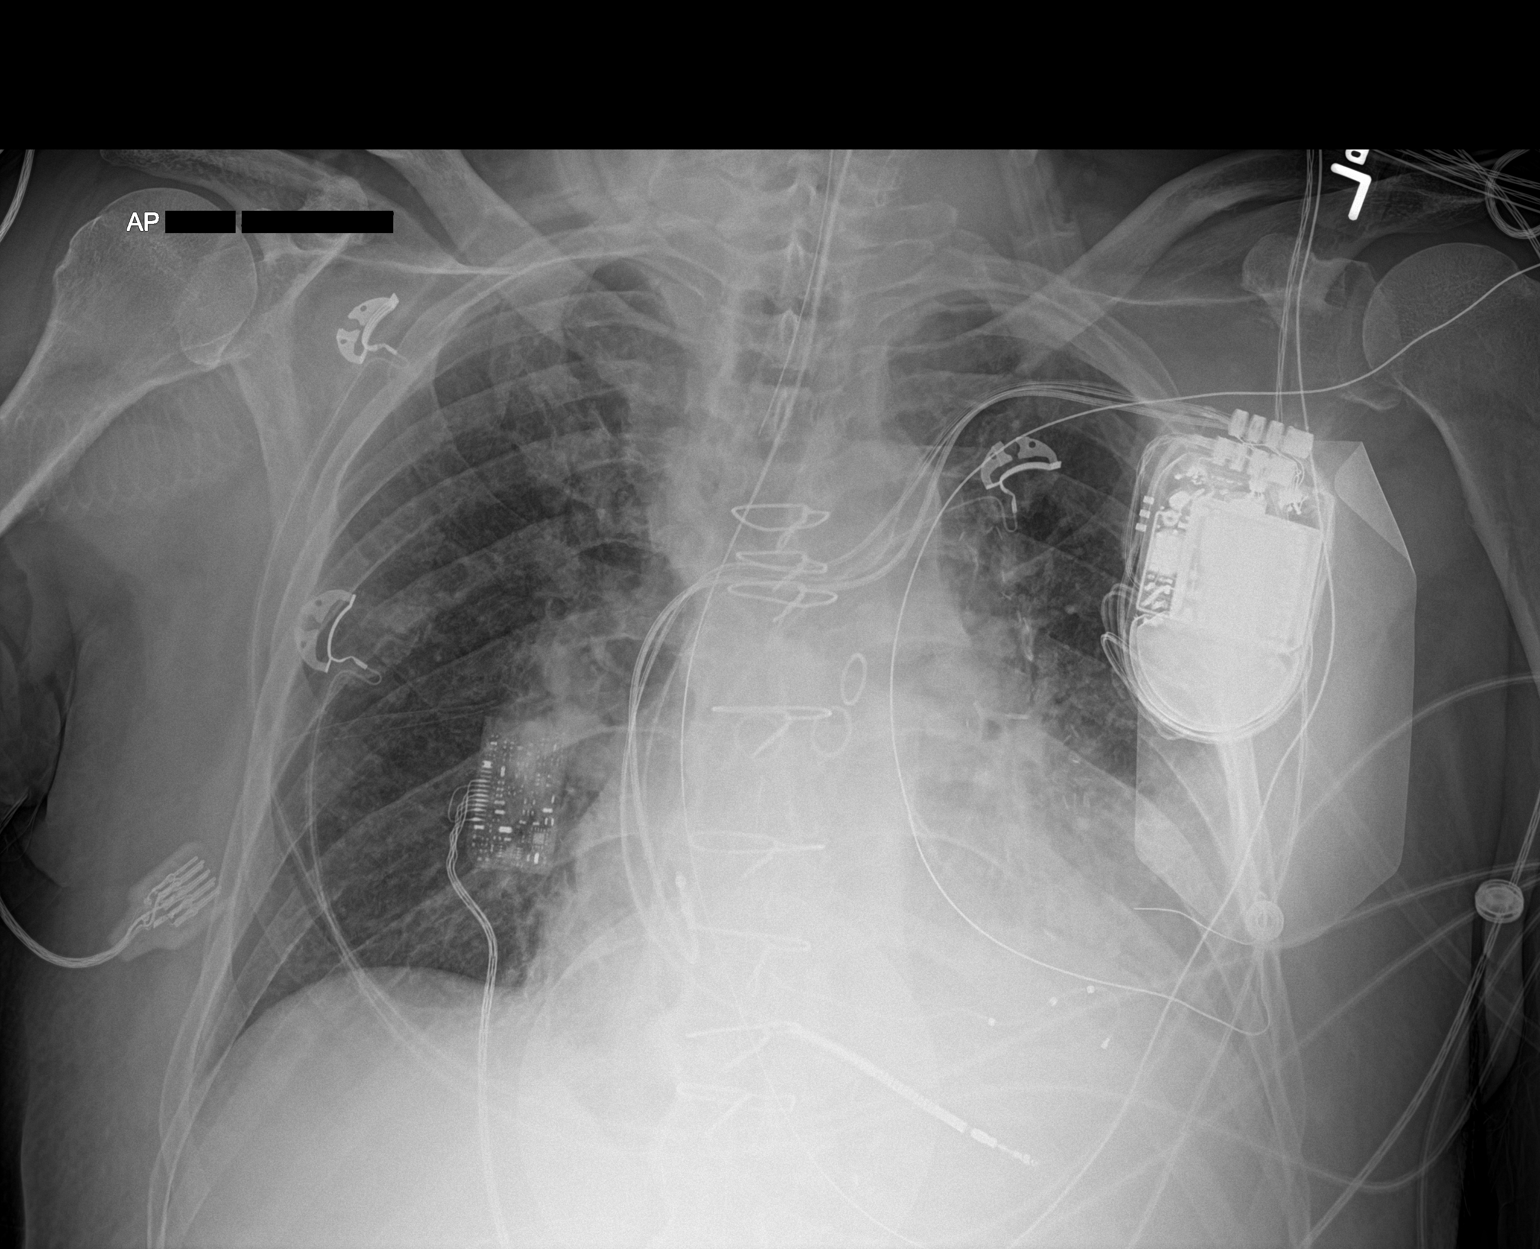

[1 of 1 positions shown; findings below may reference images not displayed]

FINDINGS: Endotracheal and NG tubes stable. Left subclavian AICD device and
leads are stable. Mediastinal tube is been removed. Vascular
congestion without Kerley B lines are stable. Opacity at the left
base is stable. No pneumothorax.
IMPRESSION: Mediastinal tube removal without pneumothorax.

Stable vascular congestion.

## 2019-05-01 IMAGING — CT CT HEAD W/O CM
4 series · 16 of 47 positions shown, 18 images · non-contrast
Comparison: CT HEAD September 05, 2017

CLINICAL DATA: Hypoxic ischemic encephalopathy.

EXAM:
CT HEAD WITHOUT CONTRAST
TECHNIQUE: Contiguous axial images were obtained from the base of the skull
through the vertex without intravenous contrast.

[Series 3: head without · axial · non-contrast · 0.48mm/px · z∈[-163,-23]mm · 7 of 38 slices shown, 9 images]
[im 5/38  brain]
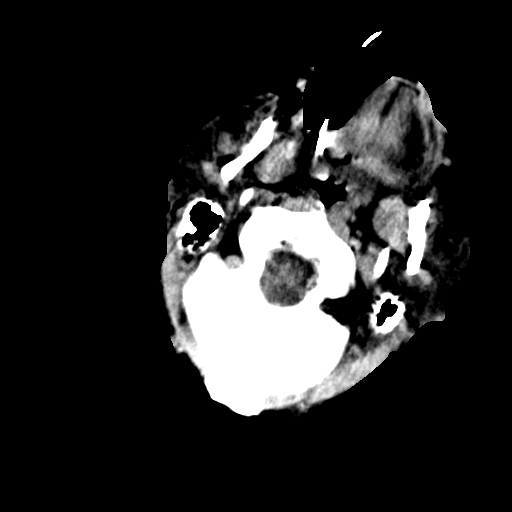
[im 5/38  bone]
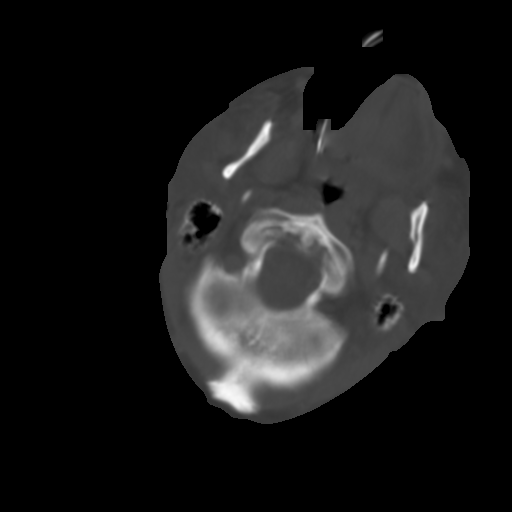
[im 10/38  brain]
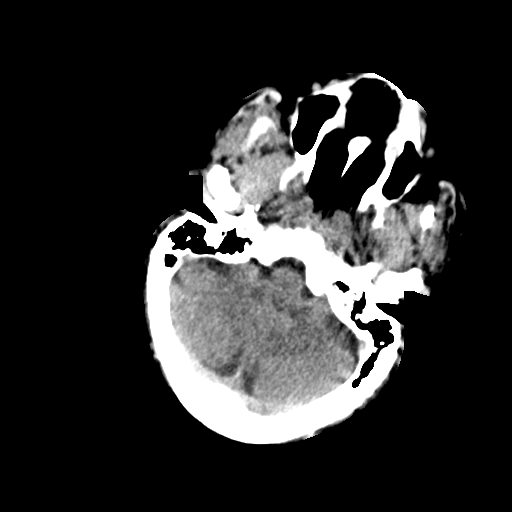
[im 14/38  brain]
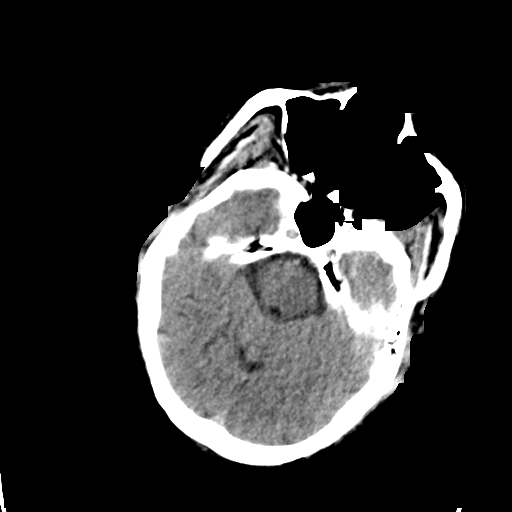
[im 19/38  brain]
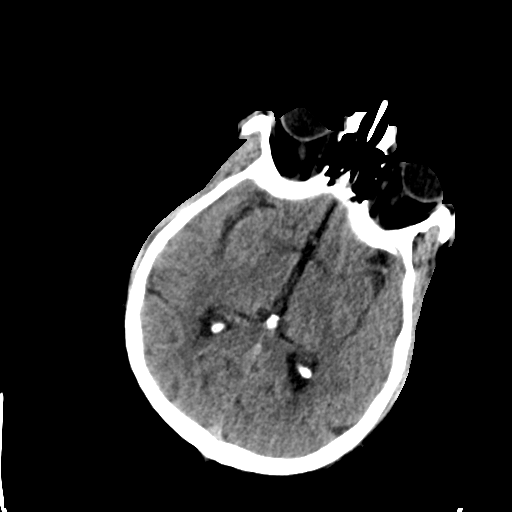
[im 24/38  brain]
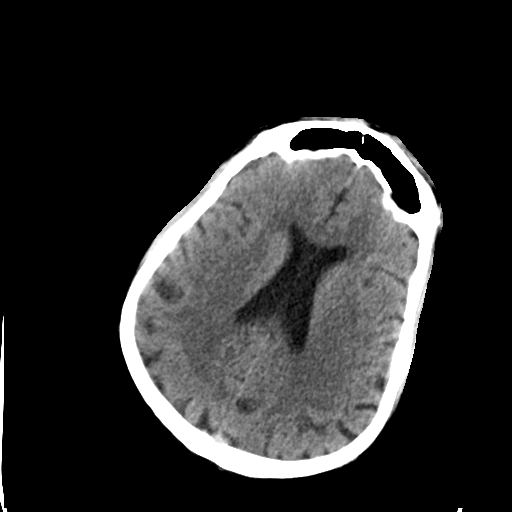
[im 24/38  bone]
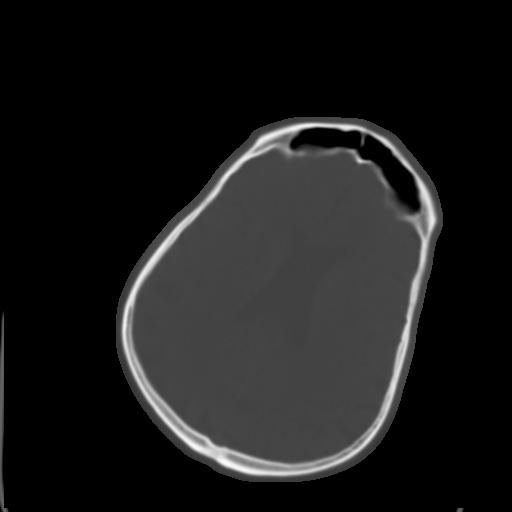
[im 28/38  brain]
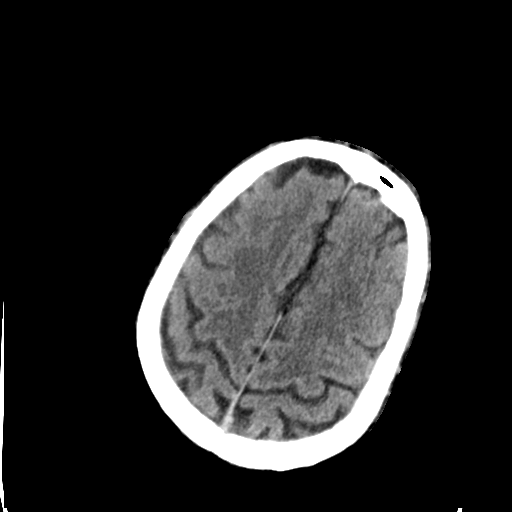
[im 33/38  brain]
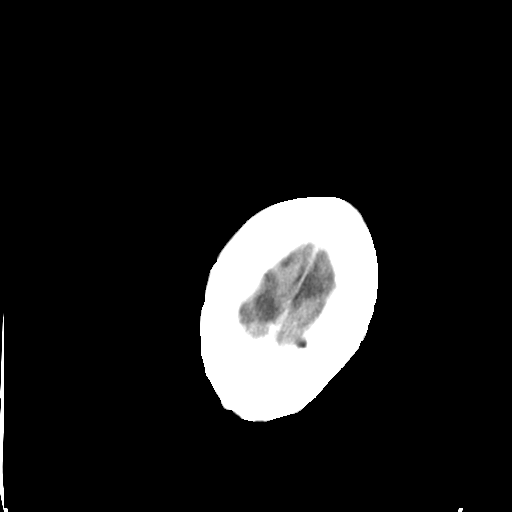

[Series 4: head bone · axial · 0.48mm/px · z∈[-165,-129]mm · 3 of 93 slices shown]
[im 10/93  bone]
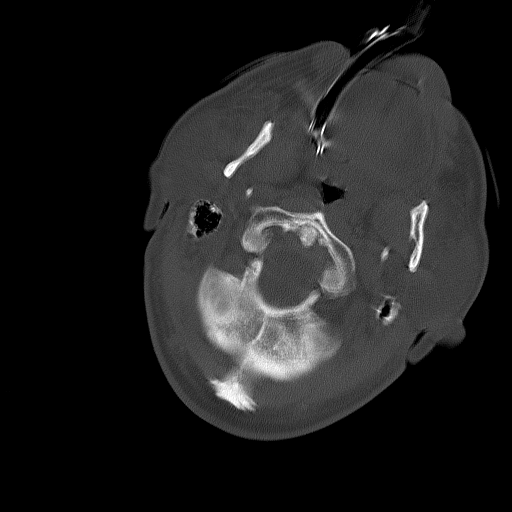
[im 19/93  bone]
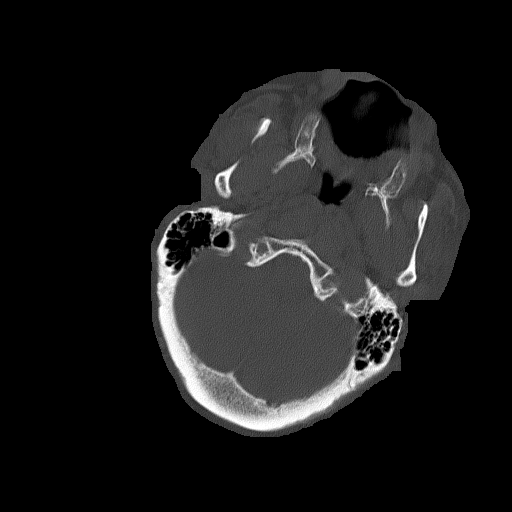
[im 28/93  bone]
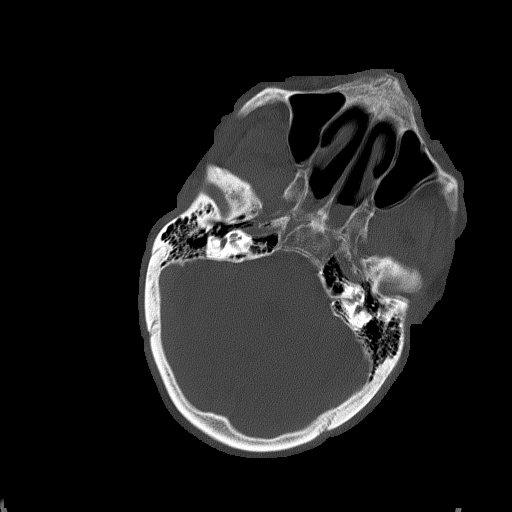

[Series 5: head without cor · coronal · non-contrast · 0.35mm/px · 3 of 70 slices shown]
[im 24/70  brain]
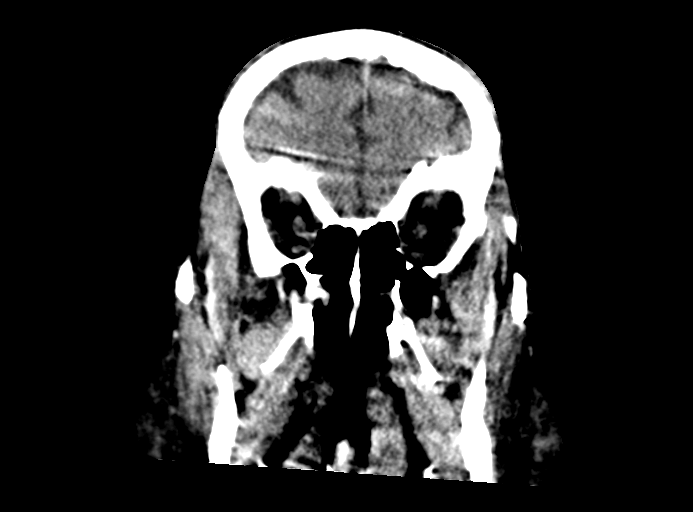
[im 31/70  brain]
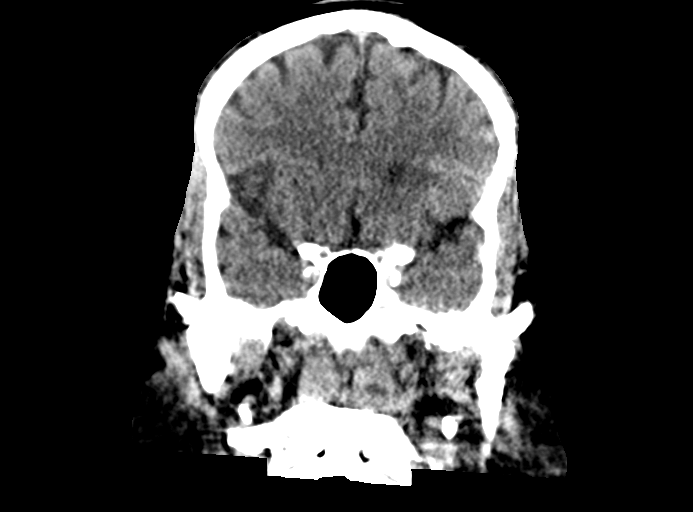
[im 39/70  brain]
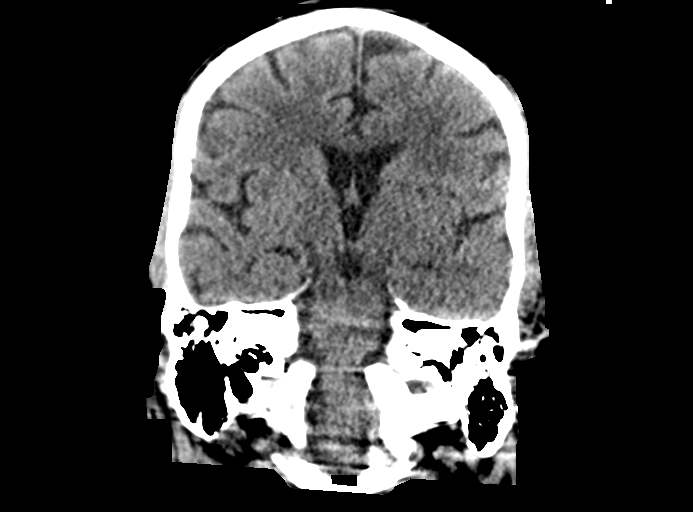

[Series 6: head without sag · sagittal · non-contrast · 0.41mm/px · 3 of 47 slices shown]
[im 16/47  brain]
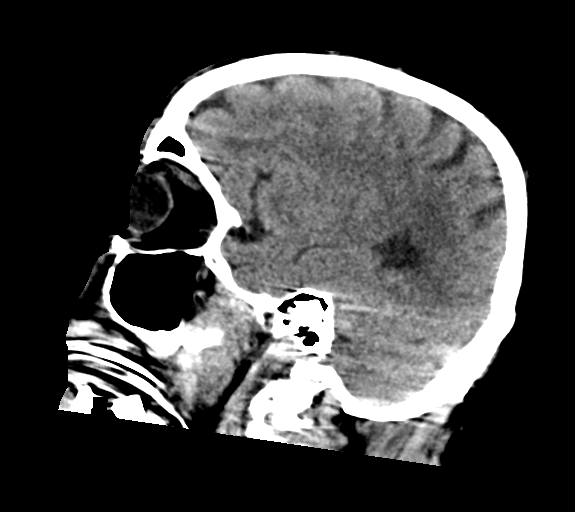
[im 24/47  brain]
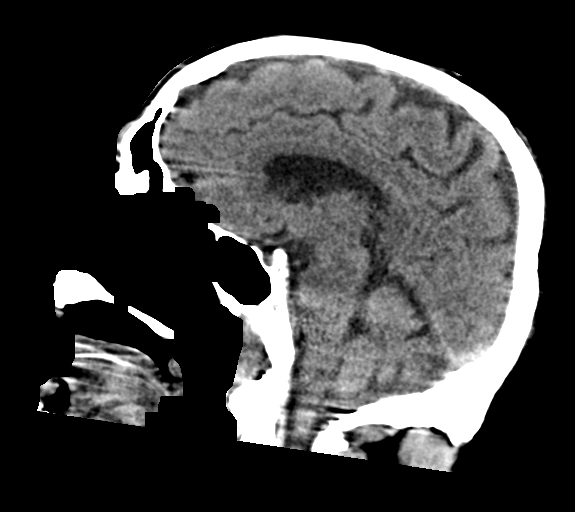
[im 31/47  brain]
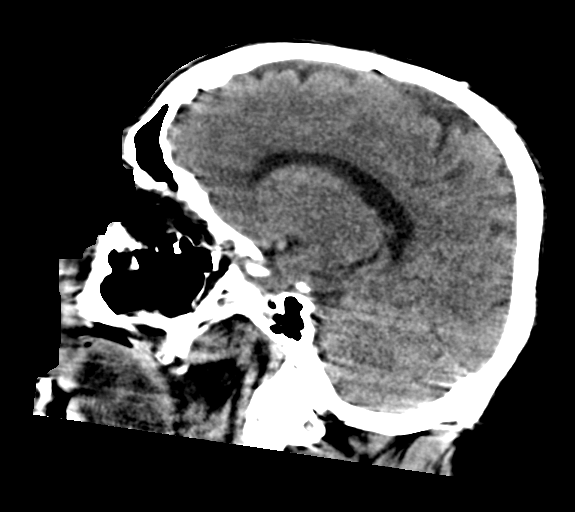

[16 of 47 positions shown; findings below may reference images not displayed]

FINDINGS: Moderately motion degraded examination.

BRAIN: No intraparenchymal hemorrhage, mass effect nor midline
shift. The ventricles and sulci are normal for age. Patchy
supratentorial white matter hypodensities within normal range for
patient's age, though non-specific are most compatible with chronic
small vessel ischemic disease. No acute large vascular territory
infarcts. No abnormal extra-axial fluid collections. Basal cisterns
are patent.

VASCULAR: Mild to moderate calcific atherosclerosis of the carotid
siphons.

SKULL: No skull fracture. No significant scalp soft tissue swelling.

SINUSES/ORBITS: Mild LEFT sphenoid sinusitis without air-fluid
levels. Mastoid air cells are well aerated. Included ocular globes
and orbital contents are non-suspicious. Status post bilateral
ocular lens implants.

OTHER: Life-support lines in place.
IMPRESSION: 1. No acute intracranial process on this moderately motion degraded
examination.
2. Mild to moderate chronic small vessel ischemic disease.
